# Patient Record
Sex: Female | Born: 1935
Health system: Southern US, Community
[De-identification: ages and names within clinical notes are randomized; demographics above are authoritative.]

## PROBLEM LIST (undated history)

## (undated) DIAGNOSIS — D649 Anemia, unspecified: Secondary | ICD-10-CM

## (undated) DIAGNOSIS — F329 Major depressive disorder, single episode, unspecified: Secondary | ICD-10-CM

## (undated) DIAGNOSIS — F0390 Unspecified dementia without behavioral disturbance: Secondary | ICD-10-CM

## (undated) DIAGNOSIS — K589 Irritable bowel syndrome without diarrhea: Secondary | ICD-10-CM

## (undated) DIAGNOSIS — F29 Unspecified psychosis not due to a substance or known physiological condition: Secondary | ICD-10-CM

## (undated) DIAGNOSIS — H509 Unspecified strabismus: Secondary | ICD-10-CM

## (undated) DIAGNOSIS — E785 Hyperlipidemia, unspecified: Secondary | ICD-10-CM

## (undated) DIAGNOSIS — F32A Depression, unspecified: Secondary | ICD-10-CM

## (undated) DIAGNOSIS — E0590 Thyrotoxicosis, unspecified without thyrotoxic crisis or storm: Secondary | ICD-10-CM

---

## 2000-07-14 ENCOUNTER — Other Ambulatory Visit: Admission: RE | Admit: 2000-07-14 | Discharge: 2000-07-14 | Payer: Self-pay | Admitting: Family Medicine

## 2018-06-01 ENCOUNTER — Other Ambulatory Visit: Payer: Self-pay

## 2018-06-01 ENCOUNTER — Emergency Department (HOSPITAL_COMMUNITY): Payer: Medicare Other

## 2018-06-01 ENCOUNTER — Encounter (HOSPITAL_COMMUNITY): Payer: Self-pay | Admitting: *Deleted

## 2018-06-01 ENCOUNTER — Emergency Department (HOSPITAL_COMMUNITY)
Admission: EM | Admit: 2018-06-01 | Discharge: 2018-06-03 | Disposition: A | Discharge status: Other Acute Inpatient Hospital | Payer: Medicare Other | Attending: Emergency Medicine | Admitting: Emergency Medicine

## 2018-06-01 DIAGNOSIS — F22 Delusional disorders: Secondary | ICD-10-CM | POA: Diagnosis not present

## 2018-06-01 DIAGNOSIS — D509 Iron deficiency anemia, unspecified: Secondary | ICD-10-CM | POA: Diagnosis not present

## 2018-06-01 DIAGNOSIS — F329 Major depressive disorder, single episode, unspecified: Secondary | ICD-10-CM | POA: Diagnosis not present

## 2018-06-01 DIAGNOSIS — E0590 Thyrotoxicosis, unspecified without thyrotoxic crisis or storm: Secondary | ICD-10-CM | POA: Diagnosis not present

## 2018-06-01 DIAGNOSIS — F29 Unspecified psychosis not due to a substance or known physiological condition: Secondary | ICD-10-CM | POA: Diagnosis not present

## 2018-06-01 DIAGNOSIS — F062 Psychotic disorder with delusions due to known physiological condition: Secondary | ICD-10-CM | POA: Diagnosis not present

## 2018-06-01 DIAGNOSIS — R4020 Unspecified coma: Secondary | ICD-10-CM | POA: Diagnosis not present

## 2018-06-01 DIAGNOSIS — M545 Low back pain: Secondary | ICD-10-CM | POA: Diagnosis not present

## 2018-06-01 DIAGNOSIS — S22080D Wedge compression fracture of T11-T12 vertebra, subsequent encounter for fracture with routine healing: Secondary | ICD-10-CM | POA: Diagnosis not present

## 2018-06-01 DIAGNOSIS — Z79899 Other long term (current) drug therapy: Secondary | ICD-10-CM | POA: Insufficient documentation

## 2018-06-01 DIAGNOSIS — F05 Delirium due to known physiological condition: Secondary | ICD-10-CM | POA: Diagnosis not present

## 2018-06-01 DIAGNOSIS — F99 Mental disorder, not otherwise specified: Secondary | ICD-10-CM | POA: Diagnosis not present

## 2018-06-01 DIAGNOSIS — X58XXXA Exposure to other specified factors, initial encounter: Secondary | ICD-10-CM | POA: Insufficient documentation

## 2018-06-01 DIAGNOSIS — R45851 Suicidal ideations: Secondary | ICD-10-CM | POA: Diagnosis not present

## 2018-06-01 DIAGNOSIS — Z681 Body mass index (BMI) 19 or less, adult: Secondary | ICD-10-CM | POA: Diagnosis not present

## 2018-06-01 DIAGNOSIS — M549 Dorsalgia, unspecified: Secondary | ICD-10-CM | POA: Diagnosis not present

## 2018-06-01 DIAGNOSIS — R4182 Altered mental status, unspecified: Secondary | ICD-10-CM | POA: Diagnosis not present

## 2018-06-01 DIAGNOSIS — S299XXD Unspecified injury of thorax, subsequent encounter: Secondary | ICD-10-CM | POA: Diagnosis present

## 2018-06-01 HISTORY — DX: Hyperlipidemia, unspecified: E78.5

## 2018-06-01 HISTORY — DX: Major depressive disorder, single episode, unspecified: F32.9

## 2018-06-01 HISTORY — DX: Irritable bowel syndrome, unspecified: K58.9

## 2018-06-01 HISTORY — DX: Unspecified psychosis not due to a substance or known physiological condition: F29

## 2018-06-01 HISTORY — DX: Unspecified strabismus: H50.9

## 2018-06-01 HISTORY — DX: Depression, unspecified: F32.A

## 2018-06-01 HISTORY — DX: Anemia, unspecified: D64.9

## 2018-06-01 HISTORY — DX: Thyrotoxicosis, unspecified without thyrotoxic crisis or storm: E05.90

## 2018-06-01 LAB — COMPREHENSIVE METABOLIC PANEL
ALBUMIN: 4 g/dL (ref 3.5–5.0)
ALK PHOS: 54 U/L (ref 38–126)
ALT: 20 U/L (ref 0–44)
AST: 26 U/L (ref 15–41)
Anion gap: 10 (ref 5–15)
BUN: 5 mg/dL — ABNORMAL LOW (ref 8–23)
CALCIUM: 9.4 mg/dL (ref 8.9–10.3)
CO2: 25 mmol/L (ref 22–32)
CREATININE: 0.4 mg/dL — AB (ref 0.44–1.00)
Chloride: 102 mmol/L (ref 98–111)
GFR calc Af Amer: >60 mL/min (ref >60)
GFR calc non Af Amer: >60 mL/min (ref >60)
GLUCOSE: 115 mg/dL — AB (ref 70–99)
Potassium: 3.8 mmol/L (ref 3.5–5.1)
Sodium: 137 mmol/L (ref 135–145)
Total Bilirubin: 1.2 mg/dL (ref 0.3–1.2)
Total Protein: 6.4 g/dL — ABNORMAL LOW (ref 6.5–8.1)

## 2018-06-01 LAB — URINALYSIS, ROUTINE W REFLEX MICROSCOPIC
BACTERIA UA: NONE SEEN
BILIRUBIN URINE: NEGATIVE
Glucose, UA: NEGATIVE mg/dL
KETONES UR: 20 mg/dL — AB
LEUKOCYTES UA: NEGATIVE
Nitrite: NEGATIVE
PROTEIN: NEGATIVE mg/dL
Specific Gravity, Urine: 1.01 (ref 1.005–1.030)
pH: 6 (ref 5.0–8.0)

## 2018-06-01 LAB — SALICYLATE LEVEL

## 2018-06-01 LAB — CBC WITH DIFFERENTIAL/PLATELET
BASOS PCT: 0 %
Basophils Absolute: 0 10*3/uL (ref 0.0–0.1)
Eosinophils Absolute: 0 10*3/uL (ref 0.0–0.7)
Eosinophils Relative: 0 %
HCT: 43.2 % (ref 36.0–46.0)
Hemoglobin: 14.2 g/dL (ref 12.0–15.0)
Lymphocytes Relative: 12 %
Lymphs Abs: 0.8 10*3/uL (ref 0.7–4.0)
MCH: 26.3 pg (ref 26.0–34.0)
MCHC: 32.9 g/dL (ref 30.0–36.0)
MCV: 80.1 fL (ref 78.0–100.0)
MONO ABS: 0.5 10*3/uL (ref 0.1–1.0)
Monocytes Relative: 7 %
NEUTROS ABS: 5.6 10*3/uL (ref 1.7–7.7)
NEUTROS PCT: 81 %
Platelets: 236 10*3/uL (ref 150–400)
RBC: 5.39 MIL/uL — ABNORMAL HIGH (ref 3.87–5.11)
RDW: 14.8 % (ref 11.5–15.5)
WBC: 7 10*3/uL (ref 4.0–10.5)

## 2018-06-01 LAB — RAPID URINE DRUG SCREEN, HOSP PERFORMED
Amphetamines: NOT DETECTED
BARBITURATES: NOT DETECTED
Benzodiazepines: NOT DETECTED
Cocaine: NOT DETECTED
Opiates: NOT DETECTED
TETRAHYDROCANNABINOL: NOT DETECTED

## 2018-06-01 LAB — ACETAMINOPHEN LEVEL: Acetaminophen (Tylenol), Serum: <10 ug/mL — ABNORMAL LOW (ref 10–30)

## 2018-06-01 LAB — ETHANOL: Alcohol, Ethyl (B): <10 mg/dL (ref <10)

## 2018-06-01 MED ORDER — DIPHENHYDRAMINE HCL 50 MG/ML IJ SOLN
25.0000 mg | Freq: Once | INTRAMUSCULAR | Status: AC
  Administered 2018-06-01: 25 mg via INTRAMUSCULAR
  Filled 2018-06-01: qty 1

## 2018-06-01 MED ORDER — LORAZEPAM 2 MG/ML IJ SOLN
1.0000 mg | Freq: Once | INTRAMUSCULAR | Status: AC
  Administered 2018-06-01: 1 mg via INTRAMUSCULAR
  Filled 2018-06-01: qty 1

## 2018-06-01 MED ORDER — LORAZEPAM 1 MG PO TABS: 1.0000 mg | ORAL_TABLET | ORAL | Status: DC | PRN

## 2018-06-01 NOTE — ED Notes (Signed)
Patients [facility]CP [facility] [phone] MD-Dr. Felipa EthAvva

## 2018-06-01 NOTE — ED Notes (Signed)
Bed: WA27 Expected date:  Expected time:  Means of arrival:  Comments: EMS-psychosis

## 2018-06-01 NOTE — ED Notes (Signed)
Pt & family provided copy of department rules.

## 2018-06-01 NOTE — ED Provider Notes (Signed)
[facility]-EMERGENCY DEPT Provider Note   CSN: [id number] Arrival date & time: 06/01/18  1643     History   Chief Complaint Chief Complaint  Patient presents with  . IVC  . Psychiatriac evaluation    HPI Deanna Strong is a 84 y.o. female.  Deanna Strong is a 84 y.o. Female with a history of anemia, hyperlipidemia, hypothyroidism, IBS, who presents to the emergency department after being sent by her primary care provider for evaluation of psychosis.  Patient reports she does not need to be here she is being interviewed by the fire department for special tests, and she only wants to be checked out for her low back pain.  Further history is provided by the patient's daughter who reports on Friday she was called by the patient's neighbors very early in the morning when the patient was found laying in her front yard, with concern she may have fallen, but according to the daughter the patient was seen up crawling around on her hands and knees in the yard before, she brought the patient back to her house where she continued to display similar odd behaviors and reports Saturday morning she was yelling for 6 hours straight while intermittently crawling around on the floor of her home.  She also repeatedly states things like "the beams and angles are pulling on my back and belly".  Patient reported to myself that "he airport around her house was sending beams into her brain so that she could not communicate with the others".  Patient repeatedly states that she does not need to see a psychiatrist and that she does not want to deal with this evaluation and she would rather go and be with God and does not want to live with this.  She says to a friend at the bedside "and we know how to die Wilnette Kales do not we".  Patient denies any homicidal ideations or AVH.  She denies focal chest pain or shortness of breath, no headaches or vision changes, no abdominal pain.  She does report low back pain  which daughter reports she has been intermittently complaining of over the past few weeks, but has been ambulatory and moving around without difficulty.  Patient's daughter denies history of similar behavior in the past, patient was taken to her internal medicine doctor, Dr. Felipa Eth, and sent to the ED for further evaluation for concern of psychosis, daughter also expresses concern for early signs of dementia.  No recent head trauma or injury.  They were trying to get patient placed at friend's home, but due to her erratic behaviors over the past few days were not able to get physician approval.      Past Medical History:  Diagnosis Date  . Anemia   . Depression   . Hyperlipidemia   . Hyperthyroidism   . IBS (irritable bowel syndrome)   . Psychosis (HCC) 06/01/2018  . Strabismus     There are no active problems to display for this patient.   OB History   None      Home Medications    Prior to Admission medications   Medication Sig Start Date End Date Taking? Authorizing Provider  nystatin (MYCOSTATIN) 100000 UNIT/ML suspension Take 2.5 mLs by mouth 3 (three) times daily.   Yes [provider]  OVER THE COUNTER MEDICATION Take 1-2 capsules by mouth daily.   Yes [provider]    Family History No family history on file.  Social History Social History  Tobacco Use  . Smoking status: Not on file  Substance Use Topics  . Alcohol use: Not on file  . Drug use: Not on file     Allergies   Patient has no known allergies.   Review of Systems Review of Systems  Constitutional: Negative for chills and fever.  HENT: Negative.   Eyes: Negative for visual disturbance.  Respiratory: Negative for cough, chest tightness, shortness of breath and wheezing.   Cardiovascular: Negative for chest pain, palpitations and leg swelling.  Gastrointestinal: Negative for abdominal pain, diarrhea, nausea and vomiting.  Genitourinary: Negative for dysuria and frequency.    Musculoskeletal: Positive for back pain. Negative for arthralgias, joint swelling, myalgias and neck pain.  Skin: Negative for color change, rash and wound.  Neurological: Negative for dizziness, syncope, weakness, light-headedness, numbness and headaches.  Psychiatric/Behavioral: Positive for behavioral problems, confusion, sleep disturbance and suicidal ideas. Negative for self-injury.     Physical Exam Updated Vital Signs BP (!) 147/79 (BP Location: Right Arm)   Pulse 73   Temp 98.7 F (37.1 C) (Oral)   Resp 20   SpO2 94%   Physical Exam  Constitutional: She appears well-developed and well-nourished. No distress.  Patient appears clean and in no acute distress, refusing all interventions  HENT:  Head: Normocephalic and atraumatic.  Mouth/Throat: Oropharynx is clear and moist.  No evidence of head trauma  Eyes: Pupils are equal, round, and reactive to light. EOM are normal. Right eye exhibits no discharge. Left eye exhibits no discharge.  No nystagmus  Neck: Normal range of motion. Neck supple.  Cardiovascular: Normal rate, regular rhythm, normal heart sounds and intact distal pulses.  Pulmonary/Chest: Effort normal and breath sounds normal. No respiratory distress.  Respirations equal and unlabored, patient able to speak in full sentences, lungs clear to auscultation bilaterally  Abdominal: Soft. Bowel sounds are normal. She exhibits no distension and no mass. There is no tenderness. There is no guarding.  Abdomen soft, nondistended, nontender to palpation in all quadrants without guarding or peritoneal signs  Musculoskeletal: She exhibits no edema or deformity.  Patient localizes pain to the lower thoracic, and upper midline lumbar spine, no palpable deformity on exam, ambulatory and moving lower extremities without difficulty  Neurological: She is alert. Coordination normal.  Speech is clear, able to follow commands but easily distracted and requiring redirection, oriented to  person, place and time but not situation CN III-XII intact Normal strength in upper and lower extremities bilaterally including dorsiflexion and plantar flexion, strong and equal grip strength Sensation normal to light and sharp touch Moves extremities without ataxia, coordination intact No pronator drift  Skin: Skin is warm and dry. Capillary refill takes less than 2 seconds. She is not diaphoretic.  Psychiatric: Her affect is labile. Her speech is rapid and/or pressured and tangential. She is hyperactive. She is not actively hallucinating. Thought content is delusional. Cognition and memory are impaired. She expresses impulsivity. She expresses suicidal ideation. She expresses no homicidal ideation. She expresses no suicidal plans.  Nursing note and vitals reviewed.    ED Treatments / Results  Labs (all labs ordered are listed, but only abnormal results are displayed) Labs Reviewed  COMPREHENSIVE METABOLIC PANEL - Abnormal; Notable for the following components:      Result Value   Glucose, Bld 115 (*)    BUN 5 (*)    Creatinine, Ser 0.40 (*)    Total Protein 6.4 (*)    All other components within normal limits  CBC WITH  DIFFERENTIAL/PLATELET - Abnormal; Notable for the following components:   RBC 5.39 (*)    All other components within normal limits  ACETAMINOPHEN LEVEL - Abnormal; Notable for the following components:   Acetaminophen (Tylenol), Serum <10 (*)    All other components within normal limits  URINALYSIS, ROUTINE W REFLEX MICROSCOPIC - Abnormal; Notable for the following components:   Hgb urine dipstick MODERATE (*)    Ketones, ur 20 (*)    All other components within normal limits  ETHANOL  RAPID URINE DRUG SCREEN, HOSP PERFORMED  SALICYLATE LEVEL    EKG EKG Interpretation  Date/Time:  Tuesday June 01 2018 20:59:05 EDT Ventricular Rate:  75 PR Interval:  142 QRS Duration: 94 QT Interval:  412 QTC Calculation: 460 R Axis:   57 Text Interpretation:  Normal  sinus rhythm Nonspecific ST abnormality Abnormal ECG No previous ECGs available Confirmed by Richardean CanalYao, David H [phone]([id number]) on 06/01/2018 9:27:52 PM   Radiology Dg Chest 2 View  Result Date: 06/01/2018 CLINICAL DATA:  Chronic back pain EXAM: CHEST - 2 VIEW COMPARISON:  None. FINDINGS: Lungs are clear.  No pleural effusion or pneumothorax. The heart is normal in size. Visualized osseous structures are within normal limits. IMPRESSION: Normal chest radiographs. Electronically Signed   By: Charline BillsSriyesh  Krishnan M.D.   On: 06/01/2018 21:56   Dg Lumbar Spine Complete  Result Date: 06/01/2018 CLINICAL DATA:  Chronic back pain EXAM: LUMBAR SPINE - COMPLETE 4+ VIEW COMPARISON:  None. FINDINGS: Five lumbar-type vertebral bodies. Normal lumbar lordosis. No evidence of fracture or dislocation. Lumbar vertebral body heights are maintained. Mild multilevel degenerative changes. Mild superior endplate compression is suspected at T12, although this appearance is age indeterminate. No retropulsion. Visualized bony pelvis appears intact. IMPRESSION: Mild superior endplate compression suspected at T12, age indeterminate. No retropulsion. Degenerative changes of the lumbar spine. Electronically Signed   By: Charline BillsSriyesh  Krishnan M.D.   On: 06/01/2018 21:56   Ct Head Wo Contrast  Result Date: 06/01/2018 CLINICAL DATA:  84 y/o F; altered level of consciousness, unexplained. EXAM: CT HEAD WITHOUT CONTRAST TECHNIQUE: Contiguous axial images were obtained from the base of the skull through the vertex without intravenous contrast. COMPARISON:  None. FINDINGS: Brain: No evidence of acute infarction, hemorrhage, hydrocephalus, extra-axial collection or mass lesion/mass effect. Mild chronic microvascular ischemic changes and volume loss of the brain. Vascular: No hyperdense vessel or unexpected calcification. Skull: Normal. Negative for fracture or focal lesion. Sinuses/Orbits: No acute finding. Other: None. IMPRESSION: 1. No acute intracranial  abnormality identified. 2. Mild chronic microvascular ischemic changes and parenchymal volume loss of the brain. Electronically Signed   By: Mitzi HansenLance  Furusawa-Stratton M.D.   On: 06/01/2018 21:37    Procedures Procedures (including critical care time)  Medications Ordered in ED Medications  LORazepam (ATIVAN) tablet 1 mg (has no administration in time range)  LORazepam (ATIVAN) injection 1 mg (1 mg Intramuscular Given 06/01/18 2107)  diphenhydrAMINE (BENADRYL) injection 25 mg (25 mg Intramuscular Given 06/01/18 2107)     Initial Impression / Assessment and Plan / ED Course  I have reviewed the triage vital signs and the nursing notes.  Pertinent labs & imaging results that were available during my care of the patient were reviewed by me and considered in my medical decision making (see chart for details).  Patient presents for evaluation of possible new onset psychosis PCP office.  Daughter describes erratic behavior over the past 4 days with delusional thinking, while in the room patient has also endorsed suicidal ideations multiple  times, denies HI, does not appear to be responding to internal stimuli but does express multiple delusions that people are using beams and angles to alter her body and brain.  Patient initially refusing any imaging or lab work, daughter attempted to take out IVC paperwork but this was rescinded by the magistrate, given patient's endorsement of suicidal ideations and delusional thinking to feel that she will need to be IVC for further evaluation.  Myself and Dr. Silverio Lay placed patient under IVC here in the ED.  Aside from low back pain localized to the lower thoracic and upper lumbar spine, no focal medical complaints.  Patient mildly hypertensive but vitals otherwise normal, lungs clear, heart RRR, abdomen benign, normal neurologic exam.  Will get typical medical screening labs as well as urinalysis, chest x-ray, EKG, lumbar x-ray, and CT of the head given new onset of  symptoms.  Medical work-up overall very reassuring, no leukocytosis, normal hemoglobin, no acute metabolic derangements, normal renal and liver function, urinalysis without signs of infection, UDS negative, ethanol, salicylate and acetaminophen levels negative.  EKG without concerning changes, chest x-ray without active cardiopulmonary disease CT head is clear.  Lumbar spine shows likely old T12 compression fracture without any evidence of retropulsion or other acute findings.  Patient is neurologically intact without localized tenderness or deformity and is moving all extremities without difficulty so do not feel any intervention is required for this.  At this time patient is medically cleared for TTS evaluation, patient placed under ED psych hold, PRN Ativan as needed for agitation, as this seemed to help patient calm down significantly.  Pending TTS evaluation for appropriate disposition.  Final Clinical Impressions(s) / ED Diagnoses   Final diagnoses:  Suicidal ideation  Delusional thoughts (HCC)  Compression fracture of T12 vertebra with routine healing    ED Discharge Orders    None       Dartha Lodge, [place] 06/02/18 0133    Charlynne Pander, MD 06/05/18 1550

## 2018-06-01 NOTE — ED Notes (Signed)
Deanna EconomyCarol Strong-pts daughter [phone] Emergency contact

## 2018-06-01 NOTE — ED Triage Notes (Signed)
Patient came from home by EMS, went to MD today and was dx with psychosis and [facility]  Patient doesn't want to go so EMS brought patient here to be medically cleared while [facility]  Patient agrees to coming here due to having chronic back pain.

## 2018-06-01 NOTE — ED Notes (Signed)
Pt refusing blood draw, sts "it costs too much".  Informed pt that the EDP has put in orders to have blood draw.  Pt sts she understands but still refuses.  RN notified. Pt is not IVC

## 2018-06-01 NOTE — ED Notes (Signed)
Family at bedside. 

## 2018-06-01 NOTE — ED Notes (Signed)
Bed: [id number] Expected date:  Expected time:  Means of arrival:  Comments: [facility]

## 2018-06-01 NOTE — ED Notes (Signed)
Patient is refusing blood work.

## 2018-06-02 MED ORDER — QUETIAPINE FUMARATE 50 MG PO TABS: 50.0000 mg | ORAL_TABLET | Freq: Every day | ORAL | Status: DC

## 2018-06-02 NOTE — ED Notes (Signed)
Family (daughter) now at bedside with patient.

## 2018-06-02 NOTE — ED Notes (Signed)
Pt stated "I just want to die a natural death."

## 2018-06-02 NOTE — ED Notes (Signed)
Patient refused seroquel

## 2018-06-02 NOTE — Progress Notes (Signed)
[facility] [place] [facility] [place] at this [facility].  CSW will continue to follow for D/C needs.  Dorothe PeaJonathan F. Machele Deihl, LCSW, LCAS, CSI Clinical Social Worker Ph: [phone]

## 2018-06-02 NOTE — BH Assessment (Addendum)
[facility]  Per Juanetta BeetsJacqueline Norman, DO, this pt requires psychiatric hospitalization at this time.  Pt presents under IVC initiated by EDP Chaney Mallingavid Yao, MD.  The following facilities have been contacted to seek placement for this pt, with results as noted:  Beds available, information sent, decision pending:  [facility]   At capacityOtho Perl:  [facility]  Please note that in the presence of this Clinical research associatewriter and pt's daughter, Lamar LaundrySonya [phone]), pt gave verbal consent to release information to the daughter, asking that she be kept updated regarding disposition.  Doylene Canninghomas Davina Howlett, [place] Behavioral Health Coordinator [phone]

## 2018-06-02 NOTE — Progress Notes (Addendum)
CSW received a call from Deanna Strong  [facility] [place] Number for report is: [phone] Pt's unit/room/bed number will be: [facility] 5th floor Accepting physician: Dr. Shanon PayorAndrew Owens (DO)   Pt can arrive ASAP on 7/31 or anytime on 8/1  Pt is IVC'd  CSW will update RN.  Dorothe PeaJonathan F. Ranika Mcniel, LCSW, LCAS, CSI Clinical Social Worker Ph: [phone]   '

## 2018-06-02 NOTE — BH Assessment (Signed)
Assessment Note  Deanna OrleansJutta Strong is an 84 y.o. female who [facility] for [facility] evaluation and later IVC'd by ER provider's for making suicidal statements.  According to the IVC Petition Pt states "I just want to go to God and that I know how to die." Pt states "That people are using beams and angles to pull her back apart and get into her head so she can't communicate with others."   LPC attempted to assess pt but pt was resting and not alert therefore information was gathered from pt's daughter, Ladon ApplebaumCarol Folmer [phone]) to complete this assessment.  Per daughter, Patient lived on her own until 05/28/18 when she was found crawling around in the front yard in the early morning.  Patient was moved into the home with the daughter and had been doing fine until this morning when the patient was found in the living room doing the same thing.  The patient's mother had a decline in her mental state a 5 years before her death.  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 Pt was military spouse and considers herself a widow because she never remarried after divorcing.  Pt did not have any medication changes.  Daughter did not report any history of abuse of psychiatric or substance abuse treatment.  Disposition: [facility], Nira ConnJason Berry, NP who recommends the pt is observed overnight for safety and stability to be reevaluated in the AM by psychiatry.  [facility] provider and pt's nurse of the recommended disposition.  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 Anemia   . Depression   . Hyperlipidemia   . Hyperthyroidism   . IBS (irritable bowel syndrome)   . Psychosis (HCC) 06/01/2018  . Strabismus       Family  History: No family history on file.  Social History:  has no tobacco, alcohol, and drug history on file.  Additional Social History:  Alcohol / Drug Use Pain Medications: See MARs Prescriptions: See MARs Over the Counter: See MARs History of alcohol / drug use?: No history of alcohol / drug abuse  CIWA: CIWA-Ar BP: (!) 147/79 Pulse Rate: 73 COWS:    Allergies: No Known Allergies  Home Medications:  (Not in a hospital admission)  OB/GYN Status:  No LMP recorded.  General Assessment Data [facility] of Assessment: [facility] TTS Assessment: In system Is this a Tele or Face-to-Face Assessment?: Face-to-Face Is this an Initial Assessment or a Re-assessment for this encounter?: Initial Assessment Marital status: Widowed(claims widowed never remarried after divorce) Juanell FairlyMaiden name: Hilcken Is patient pregnant?: No Pregnancy Status: No Living Arrangements: Children(Pt moved in with daughter on 05/28/18) Can pt return to current living arrangement?: Yes(If long term place is not more suitable) Admission Status: Involuntary Is patient capable of signing voluntary admission?: No Referral Source: Self/Family/Friend Insurance type: Medicare     Crisis Care Plan Living Arrangements: Children(Pt moved in with daughter on 05/28/18) Legal Guardian: Other:(Self)  Education Status Is patient currently in school?: No Is the patient employed, unemployed or receiving disability?: (Retired)  Risk to self with the past 6 months Suicidal Ideation: Yes-Currently Present Has patient been a risk to self within the past 6 months prior to admission? : No Suicidal  Intent: No Has patient had any suicidal intent within the past 6 months prior to admission? : No Is patient at risk for suicide?: No Suicidal Plan?: No Has patient had any suicidal plan within the past 6 months prior to admission? : No Access to Means: No Previous Attempts/Gestures: No Triggers for Past Attempts: Unknown Intentional Self  Injurious Behavior: None Family Suicide History: No Recent stressful life event(s): Other (Comment)(Decline in mental health) Persecutory voices/beliefs?: No Depression: Yes Depression Symptoms: Insomnia, Isolating, Guilt, Feeling worthless/self pity, Feeling angry/irritable Substance abuse history and/or treatment for substance abuse?: No Suicide prevention information given to non-admitted patients: Not applicable  Risk to Others within the past 6 months Homicidal Ideation: No Does patient have any lifetime risk of violence toward others beyond the six months prior to admission? : No Thoughts of Harm to Others: No Current Homicidal Intent: No Current Homicidal Plan: No Access to Homicidal Means: No History of harm to others?: No Assessment of Violence: None Noted Does patient have access to weapons?: No Criminal Charges Pending?: No Does patient have a court date: No Is patient on probation?: No  Psychosis Hallucinations: Visual Delusions: Unspecified  Mental Status Report Appearance/Hygiene: In hospital gown Eye Contact: Unable to Assess Motor Activity: Freedom of movement Speech: Incoherent, Word salad Level of Consciousness: Sleeping Mood: Other (Comment) Affect: Unable to Assess Anxiety Level: None Thought Processes: Unable to Assess Judgement: Unable to Assess Orientation: Unable to assess Obsessive Compulsive Thoughts/Behaviors: Unable to Assess  Cognitive Functioning Concentration: Unable to Assess Memory: Recent Impaired, Remote Impaired Is patient IDD: No Is patient DD?: No Insight: Poor Impulse Control: Poor Appetite: Fair Have you had any weight changes? : No Change Sleep: Decreased Total Hours of Sleep: 3 Vegetative Symptoms: None  ADLScreening [facility] Assessment Services) Patient's cognitive ability adequate to safely complete daily activities?: Yes Patient able to express need for assistance with ADLs?: Yes Independently performs ADLs?: Yes  (appropriate for developmental age)  Prior Inpatient Therapy Prior Inpatient Therapy: No  Prior Outpatient Therapy Prior Outpatient Therapy: No Does patient have an ACCT team?: No Does patient have Intensive In-House Services?  : No Does patient have [facility] services? : No Does patient have P4CC services?: No  ADL Screening (condition at time of admission) Patient's cognitive ability adequate to safely complete daily activities?: Yes Is the patient deaf or have difficulty hearing?: No Does the patient have difficulty seeing, even when wearing glasses/contacts?: No Does the patient have difficulty concentrating, remembering, or making decisions?: No Patient able to express need for assistance with ADLs?: Yes Does the patient have difficulty dressing or bathing?: No Independently performs ADLs?: Yes (appropriate for developmental age) Does the patient have difficulty walking or climbing stairs?: No Weakness of Legs: None Weakness of Arms/Hands: None  Home Assistive Devices/Equipment Home Assistive Devices/Equipment: None    Abuse/Neglect Assessment (Assessment to be complete while patient is alone) Abuse/Neglect Assessment Can Be Completed: Unable to assess, patient is non-responsive or altered mental status   Consults Spiritual Care Consult Needed: No Social Work Consult Needed: Yes (Comment)(Pt may need alternative living placement) Advance Directives (For Healthcare) Does Patient Have a Medical Advance Directive?: No Would patient like information on creating a medical advance directive?: No - Patient declined          Disposition: Discussed case with [facility] provider, Nira Conn, NP who recommends the pt is observed overnight for safety and stability to be reevaluated in the AM by psychiatry.  [facility] informed ER provider and pt's nurse of the  recommended disposition.  Disposition Initial Assessment Completed for this Encounter: Yes Patient referred to: Other  (Comment)(Observe for safety and stability and reevaluate in the AM )  On Site Evaluation by:   Reviewed with Physician:    Almon Whitford L Kenniya Westrich 06/02/2018 12:34 AM

## 2018-06-03 DIAGNOSIS — R419 Unspecified symptoms and signs involving cognitive functions and awareness: Secondary | ICD-10-CM | POA: Diagnosis present

## 2018-06-03 DIAGNOSIS — F22 Delusional disorders: Secondary | ICD-10-CM | POA: Diagnosis not present

## 2018-06-03 DIAGNOSIS — G301 Alzheimer's disease with late onset: Secondary | ICD-10-CM | POA: Diagnosis not present

## 2018-06-03 NOTE — ED Notes (Signed)
Pt has one personal belongings bag that will be given to Deputy.

## 2018-06-03 NOTE — ED Notes (Signed)
Tried to call report to [facility] [place]. They stated that they cannot take report until transportation has arrived here to transport patient there. Called Sheriff's department and left message for the need to transport [facility].

## 2018-06-03 NOTE — ED Notes (Signed)
Deputy Montez MoritaCarter present to transport [facility] to [facility] [place]. Report called to  Okey Regalarol at [facility] 5th Floor. She left department via W/C.

## 2018-06-03 NOTE — ED Notes (Signed)
Sheriffs Dep, called and will be here in about half an hour. Pt has been made aware and is in process of getting ready and obtaining updated VS.

## 2018-07-05 DIAGNOSIS — F4310 Post-traumatic stress disorder, unspecified: Secondary | ICD-10-CM | POA: Diagnosis not present

## 2018-07-05 DIAGNOSIS — F419 Anxiety disorder, unspecified: Secondary | ICD-10-CM | POA: Diagnosis not present

## 2018-07-05 DIAGNOSIS — F29 Unspecified psychosis not due to a substance or known physiological condition: Secondary | ICD-10-CM | POA: Diagnosis not present

## 2018-07-05 DIAGNOSIS — F5102 Adjustment insomnia: Secondary | ICD-10-CM | POA: Diagnosis not present

## 2018-07-05 DIAGNOSIS — R451 Restlessness and agitation: Secondary | ICD-10-CM | POA: Diagnosis not present

## 2018-07-05 DIAGNOSIS — F0391 Unspecified dementia with behavioral disturbance: Secondary | ICD-10-CM | POA: Diagnosis not present

## 2018-07-08 DIAGNOSIS — F0391 Unspecified dementia with behavioral disturbance: Secondary | ICD-10-CM | POA: Diagnosis not present

## 2018-07-08 DIAGNOSIS — F29 Unspecified psychosis not due to a substance or known physiological condition: Secondary | ICD-10-CM | POA: Diagnosis not present

## 2018-07-08 DIAGNOSIS — M4854XS Collapsed vertebra, not elsewhere classified, thoracic region, sequela of fracture: Secondary | ICD-10-CM | POA: Diagnosis not present

## 2018-07-08 DIAGNOSIS — F22 Delusional disorders: Secondary | ICD-10-CM | POA: Diagnosis not present

## 2018-07-27 ENCOUNTER — Telehealth: Payer: Self-pay

## 2018-07-27 NOTE — Telephone Encounter (Signed)
VM left to offer to schedule visit with Palliative Care 

## 2018-07-29 ENCOUNTER — Telehealth: Payer: Self-pay

## 2018-07-29 NOTE — Telephone Encounter (Signed)
Received a phone call from patient's a daughter, Celine Mans, to schedule a visit with Palliative Care. Scheduled for 07/30/18.

## 2018-07-30 ENCOUNTER — Non-Acute Institutional Stay: Payer: Medicare Other | Admitting: Nurse Practitioner

## 2018-07-30 DIAGNOSIS — Z9114 Patient's other noncompliance with medication regimen: Secondary | ICD-10-CM | POA: Diagnosis not present

## 2018-07-30 DIAGNOSIS — F29 Unspecified psychosis not due to a substance or known physiological condition: Secondary | ICD-10-CM

## 2018-07-30 DIAGNOSIS — F0391 Unspecified dementia with behavioral disturbance: Secondary | ICD-10-CM

## 2018-07-30 DIAGNOSIS — R4689 Other symptoms and signs involving appearance and behavior: Secondary | ICD-10-CM | POA: Diagnosis not present

## 2018-07-30 DIAGNOSIS — R9431 Abnormal electrocardiogram [ECG] [EKG]: Secondary | ICD-10-CM | POA: Diagnosis not present

## 2018-07-30 DIAGNOSIS — Z515 Encounter for palliative care: Secondary | ICD-10-CM | POA: Diagnosis not present

## 2018-07-30 DIAGNOSIS — R918 Other nonspecific abnormal finding of lung field: Secondary | ICD-10-CM | POA: Diagnosis not present

## 2018-07-30 DIAGNOSIS — I771 Stricture of artery: Secondary | ICD-10-CM | POA: Diagnosis not present

## 2018-07-30 NOTE — Progress Notes (Signed)
    PALLIATIVE CARE CONSULT VISIT   PATIENT NAME: Deanna Strong DOB: 1934-03-30 MRN: [id number]   [facility]  PRIMARY CARE PROVIDER:   [name]  REFERRING PROVIDER:  [name] [address] [place], [place] [address]  RESPONSIBLE PARTY:   Charlise Giovanetti [phone]  ASSESSMENT:    84 yo female with dementia found at her home with bizarre behaviors/crawling on the floor and making SI statements ; patient found to be acutely psychotic and was admitted to geri-psych unit. Daughter's report 3 day admission. Admitted to [facility] assisted living. Patient has been refusing all medications. During PC admission meeting with this writer, patient and both daughters, patient was angry, hostile, verbally aggressive and physically aggressive (no hitting). Patient continue to say that she is not going to take medications "poison" because she can eat natural foods.   Meeting ended early due to patient becoming increasingly angry and agitated.Staff aware of patient's current mood.      Assessment and PLAN:  Dementia with behavioral problems -psychosis -AV hallucinations -delusions -passive suicidal ideations -patient oriented to person, and place; patient talks about seeing people come in her room; with active delusion; says "she wants to die" when asked if she would do anything to hurt herself she loudly says "why would I do that" -thinks nothing is wrong with her and is refusing ALL medications -was very angry and aggressive during visit with this Clinical research associate and her two daughter's  -chronic medical problems -pancreatic insufficiency -Hyperthyroidism/thryomegaly -IBS -Anemia -Tinea pedis -HLD -strabimus -patient refusing all medications  ACP -will set meeting up with daughter's in separate location if patient continues to be angry and aggressive.    I spent 60 minutes providing this consultation,  from 10:00 to 11:00. More  than 50% of the time in this consultation was spent coordinating communication.   HISTORY OF PRESENT ILLNESS:  Deanna Strong is a 84 y.o. year old female with multiple medical problems including psychosis with hallucinations, delusions, paranoia, pancreatic insuffiencey, hyperthyroidism, thyromegaly, hyperlipidemia, anemia (refused wu). Palliative Care was asked to help with symptom management address goals of care.   CODE STATUS: FULL  PPS: 80% FAST unable to assess due to mood/aggression HOSPICE ELIGIBILITY/DIAGNOSIS: TBD  PAST MEDICAL HISTORY:  Past Medical History:  Diagnosis Date  . Anemia   . Depression   . Hyperlipidemia   . Hyperthyroidism   . IBS (irritable bowel syndrome)   . Psychosis (HCC) 06/01/2018  . Strabismus     SOCIAL HX:  Social History   Tobacco Use  . Smoking status: Not on file  Substance Use Topics  . Alcohol use: Not on file    ALLERGIES: No Known Allergies   PERTINENT MEDICATIONS:  Outpatient Encounter Medications as of 07/30/2018  Medication Sig  . nystatin (MYCOSTATIN) 100000 UNIT/ML suspension Take 2.5 mLs by mouth 3 (three) times daily.  Marland Kitchen OVER THE COUNTER MEDICATION Take 1-2 capsules by mouth daily.   No facility-administered encounter medications on file as of 07/30/2018.     PHYSICAL EXAM: limited ,  patient uncooperative, angry and aggressive  General: NAD, frail appearing, thin Cardiovascular: not assessed Pulmonary: not assessed Abdomen: not assessed GU: not assessed Extremities: no edema, no joint deformities Skin: no rashes on exposed skin Neurological:  Non-focal, angry, hostile, with active psychosis, AV Hallucinations and delusions  Vondell Sowell G [place], NP

## 2018-07-31 ENCOUNTER — Encounter: Payer: Self-pay | Admitting: Nurse Practitioner

## 2018-07-31 DIAGNOSIS — F0391 Unspecified dementia with behavioral disturbance: Secondary | ICD-10-CM | POA: Diagnosis not present

## 2018-07-31 DIAGNOSIS — B351 Tinea unguium: Secondary | ICD-10-CM | POA: Diagnosis present

## 2018-07-31 DIAGNOSIS — G309 Alzheimer's disease, unspecified: Secondary | ICD-10-CM | POA: Diagnosis present

## 2018-07-31 DIAGNOSIS — J439 Emphysema, unspecified: Secondary | ICD-10-CM | POA: Diagnosis not present

## 2018-07-31 DIAGNOSIS — R911 Solitary pulmonary nodule: Secondary | ICD-10-CM | POA: Diagnosis not present

## 2018-07-31 DIAGNOSIS — F0281 Dementia in other diseases classified elsewhere with behavioral disturbance: Secondary | ICD-10-CM | POA: Diagnosis present

## 2018-07-31 DIAGNOSIS — Z0389 Encounter for observation for other suspected diseases and conditions ruled out: Secondary | ICD-10-CM | POA: Diagnosis not present

## 2018-07-31 DIAGNOSIS — J984 Other disorders of lung: Secondary | ICD-10-CM | POA: Diagnosis not present

## 2018-07-31 DIAGNOSIS — E785 Hyperlipidemia, unspecified: Secondary | ICD-10-CM | POA: Diagnosis not present

## 2018-07-31 DIAGNOSIS — F22 Delusional disorders: Secondary | ICD-10-CM | POA: Diagnosis not present

## 2018-07-31 DIAGNOSIS — Z79899 Other long term (current) drug therapy: Secondary | ICD-10-CM | POA: Diagnosis not present

## 2018-07-31 DIAGNOSIS — Z111 Encounter for screening for respiratory tuberculosis: Secondary | ICD-10-CM | POA: Diagnosis not present

## 2018-07-31 DIAGNOSIS — Z23 Encounter for immunization: Secondary | ICD-10-CM | POA: Diagnosis not present

## 2018-07-31 DIAGNOSIS — B353 Tinea pedis: Secondary | ICD-10-CM | POA: Diagnosis present

## 2018-07-31 DIAGNOSIS — Z9114 Patient's other noncompliance with medication regimen: Secondary | ICD-10-CM | POA: Diagnosis not present

## 2018-09-03 ENCOUNTER — Ambulatory Visit: Payer: Self-pay | Admitting: Psychiatry

## 2018-09-17 DIAGNOSIS — R419 Unspecified symptoms and signs involving cognitive functions and awareness: Secondary | ICD-10-CM | POA: Diagnosis not present

## 2018-09-23 DIAGNOSIS — R419 Unspecified symptoms and signs involving cognitive functions and awareness: Secondary | ICD-10-CM | POA: Diagnosis not present

## 2018-10-06 DIAGNOSIS — R419 Unspecified symptoms and signs involving cognitive functions and awareness: Secondary | ICD-10-CM | POA: Diagnosis not present

## 2018-10-07 DIAGNOSIS — R419 Unspecified symptoms and signs involving cognitive functions and awareness: Secondary | ICD-10-CM | POA: Diagnosis not present

## 2018-10-12 ENCOUNTER — Encounter: Payer: Medicare Other | Admitting: Nurse Practitioner

## 2018-10-21 DIAGNOSIS — R419 Unspecified symptoms and signs involving cognitive functions and awareness: Secondary | ICD-10-CM | POA: Diagnosis not present

## 2018-11-11 DIAGNOSIS — R419 Unspecified symptoms and signs involving cognitive functions and awareness: Secondary | ICD-10-CM | POA: Diagnosis not present

## 2018-11-23 DIAGNOSIS — F0391 Unspecified dementia with behavioral disturbance: Secondary | ICD-10-CM | POA: Diagnosis not present

## 2018-11-23 DIAGNOSIS — E0590 Thyrotoxicosis, unspecified without thyrotoxic crisis or storm: Secondary | ICD-10-CM | POA: Diagnosis not present

## 2018-11-23 DIAGNOSIS — Z681 Body mass index (BMI) 19 or less, adult: Secondary | ICD-10-CM | POA: Diagnosis not present

## 2018-11-23 DIAGNOSIS — D509 Iron deficiency anemia, unspecified: Secondary | ICD-10-CM | POA: Diagnosis not present

## 2018-11-23 DIAGNOSIS — F29 Unspecified psychosis not due to a substance or known physiological condition: Secondary | ICD-10-CM | POA: Diagnosis not present

## 2018-11-23 DIAGNOSIS — K589 Irritable bowel syndrome without diarrhea: Secondary | ICD-10-CM | POA: Diagnosis not present

## 2018-11-25 DIAGNOSIS — R419 Unspecified symptoms and signs involving cognitive functions and awareness: Secondary | ICD-10-CM | POA: Diagnosis not present

## 2018-11-29 DIAGNOSIS — R419 Unspecified symptoms and signs involving cognitive functions and awareness: Secondary | ICD-10-CM | POA: Diagnosis not present

## 2018-12-09 DIAGNOSIS — R419 Unspecified symptoms and signs involving cognitive functions and awareness: Secondary | ICD-10-CM | POA: Diagnosis not present

## 2018-12-23 DIAGNOSIS — R419 Unspecified symptoms and signs involving cognitive functions and awareness: Secondary | ICD-10-CM | POA: Diagnosis not present

## 2018-12-27 DIAGNOSIS — R419 Unspecified symptoms and signs involving cognitive functions and awareness: Secondary | ICD-10-CM | POA: Diagnosis not present

## 2018-12-28 DIAGNOSIS — L0889 Other specified local infections of the skin and subcutaneous tissue: Secondary | ICD-10-CM | POA: Diagnosis not present

## 2018-12-28 DIAGNOSIS — L508 Other urticaria: Secondary | ICD-10-CM | POA: Diagnosis not present

## 2018-12-28 DIAGNOSIS — F22 Delusional disorders: Secondary | ICD-10-CM | POA: Diagnosis not present

## 2018-12-28 DIAGNOSIS — Z6820 Body mass index (BMI) 20.0-20.9, adult: Secondary | ICD-10-CM | POA: Diagnosis not present

## 2018-12-29 DIAGNOSIS — M4854XD Collapsed vertebra, not elsewhere classified, thoracic region, subsequent encounter for fracture with routine healing: Secondary | ICD-10-CM | POA: Diagnosis not present

## 2018-12-29 DIAGNOSIS — F0391 Unspecified dementia with behavioral disturbance: Secondary | ICD-10-CM | POA: Diagnosis not present

## 2018-12-29 DIAGNOSIS — D509 Iron deficiency anemia, unspecified: Secondary | ICD-10-CM | POA: Diagnosis not present

## 2018-12-29 DIAGNOSIS — F22 Delusional disorders: Secondary | ICD-10-CM | POA: Diagnosis not present

## 2018-12-29 DIAGNOSIS — L0889 Other specified local infections of the skin and subcutaneous tissue: Secondary | ICD-10-CM | POA: Diagnosis not present

## 2018-12-29 DIAGNOSIS — L508 Other urticaria: Secondary | ICD-10-CM | POA: Diagnosis not present

## 2018-12-29 DIAGNOSIS — D508 Other iron deficiency anemias: Secondary | ICD-10-CM | POA: Diagnosis not present

## 2019-01-03 DIAGNOSIS — L0889 Other specified local infections of the skin and subcutaneous tissue: Secondary | ICD-10-CM | POA: Diagnosis not present

## 2019-01-03 DIAGNOSIS — L508 Other urticaria: Secondary | ICD-10-CM | POA: Diagnosis not present

## 2019-01-03 DIAGNOSIS — M4854XD Collapsed vertebra, not elsewhere classified, thoracic region, subsequent encounter for fracture with routine healing: Secondary | ICD-10-CM | POA: Diagnosis not present

## 2019-01-03 DIAGNOSIS — F0391 Unspecified dementia with behavioral disturbance: Secondary | ICD-10-CM | POA: Diagnosis not present

## 2019-01-03 DIAGNOSIS — D509 Iron deficiency anemia, unspecified: Secondary | ICD-10-CM | POA: Diagnosis not present

## 2019-01-03 DIAGNOSIS — F22 Delusional disorders: Secondary | ICD-10-CM | POA: Diagnosis not present

## 2019-01-07 DIAGNOSIS — M4854XD Collapsed vertebra, not elsewhere classified, thoracic region, subsequent encounter for fracture with routine healing: Secondary | ICD-10-CM | POA: Diagnosis not present

## 2019-01-07 DIAGNOSIS — F0391 Unspecified dementia with behavioral disturbance: Secondary | ICD-10-CM | POA: Diagnosis not present

## 2019-01-07 DIAGNOSIS — D509 Iron deficiency anemia, unspecified: Secondary | ICD-10-CM | POA: Diagnosis not present

## 2019-01-07 DIAGNOSIS — F22 Delusional disorders: Secondary | ICD-10-CM | POA: Diagnosis not present

## 2019-01-07 DIAGNOSIS — L0889 Other specified local infections of the skin and subcutaneous tissue: Secondary | ICD-10-CM | POA: Diagnosis not present

## 2019-01-07 DIAGNOSIS — L508 Other urticaria: Secondary | ICD-10-CM | POA: Diagnosis not present

## 2019-01-12 DIAGNOSIS — F0391 Unspecified dementia with behavioral disturbance: Secondary | ICD-10-CM | POA: Diagnosis not present

## 2019-01-12 DIAGNOSIS — M4854XD Collapsed vertebra, not elsewhere classified, thoracic region, subsequent encounter for fracture with routine healing: Secondary | ICD-10-CM | POA: Diagnosis not present

## 2019-01-12 DIAGNOSIS — D509 Iron deficiency anemia, unspecified: Secondary | ICD-10-CM | POA: Diagnosis not present

## 2019-01-12 DIAGNOSIS — L508 Other urticaria: Secondary | ICD-10-CM | POA: Diagnosis not present

## 2019-01-12 DIAGNOSIS — L0889 Other specified local infections of the skin and subcutaneous tissue: Secondary | ICD-10-CM | POA: Diagnosis not present

## 2019-01-12 DIAGNOSIS — F22 Delusional disorders: Secondary | ICD-10-CM | POA: Diagnosis not present

## 2019-01-13 DIAGNOSIS — R419 Unspecified symptoms and signs involving cognitive functions and awareness: Secondary | ICD-10-CM | POA: Diagnosis not present

## 2019-01-19 DIAGNOSIS — F22 Delusional disorders: Secondary | ICD-10-CM | POA: Diagnosis not present

## 2019-01-19 DIAGNOSIS — M4854XD Collapsed vertebra, not elsewhere classified, thoracic region, subsequent encounter for fracture with routine healing: Secondary | ICD-10-CM | POA: Diagnosis not present

## 2019-01-19 DIAGNOSIS — F0391 Unspecified dementia with behavioral disturbance: Secondary | ICD-10-CM | POA: Diagnosis not present

## 2019-01-19 DIAGNOSIS — L508 Other urticaria: Secondary | ICD-10-CM | POA: Diagnosis not present

## 2019-01-19 DIAGNOSIS — D509 Iron deficiency anemia, unspecified: Secondary | ICD-10-CM | POA: Diagnosis not present

## 2019-01-19 DIAGNOSIS — L0889 Other specified local infections of the skin and subcutaneous tissue: Secondary | ICD-10-CM | POA: Diagnosis not present

## 2019-01-21 DIAGNOSIS — L0889 Other specified local infections of the skin and subcutaneous tissue: Secondary | ICD-10-CM | POA: Diagnosis not present

## 2019-01-21 DIAGNOSIS — M4854XD Collapsed vertebra, not elsewhere classified, thoracic region, subsequent encounter for fracture with routine healing: Secondary | ICD-10-CM | POA: Diagnosis not present

## 2019-01-21 DIAGNOSIS — L508 Other urticaria: Secondary | ICD-10-CM | POA: Diagnosis not present

## 2019-01-21 DIAGNOSIS — F0391 Unspecified dementia with behavioral disturbance: Secondary | ICD-10-CM | POA: Diagnosis not present

## 2019-01-21 DIAGNOSIS — D509 Iron deficiency anemia, unspecified: Secondary | ICD-10-CM | POA: Diagnosis not present

## 2019-01-21 DIAGNOSIS — F22 Delusional disorders: Secondary | ICD-10-CM | POA: Diagnosis not present

## 2019-02-03 DIAGNOSIS — F209 Schizophrenia, unspecified: Secondary | ICD-10-CM | POA: Diagnosis not present

## 2019-03-03 DIAGNOSIS — F200 Paranoid schizophrenia: Secondary | ICD-10-CM | POA: Diagnosis not present

## 2019-03-31 DIAGNOSIS — F200 Paranoid schizophrenia: Secondary | ICD-10-CM | POA: Diagnosis not present

## 2019-04-04 DIAGNOSIS — F0391 Unspecified dementia with behavioral disturbance: Secondary | ICD-10-CM | POA: Diagnosis not present

## 2019-04-04 DIAGNOSIS — H547 Unspecified visual loss: Secondary | ICD-10-CM | POA: Diagnosis not present

## 2019-04-04 DIAGNOSIS — Z0289 Encounter for other administrative examinations: Secondary | ICD-10-CM | POA: Diagnosis not present

## 2019-04-04 DIAGNOSIS — F05 Delirium due to known physiological condition: Secondary | ICD-10-CM | POA: Diagnosis not present

## 2019-04-04 DIAGNOSIS — M4854XS Collapsed vertebra, not elsewhere classified, thoracic region, sequela of fracture: Secondary | ICD-10-CM | POA: Diagnosis not present

## 2019-04-04 DIAGNOSIS — H9190 Unspecified hearing loss, unspecified ear: Secondary | ICD-10-CM | POA: Diagnosis not present

## 2019-04-04 DIAGNOSIS — F22 Delusional disorders: Secondary | ICD-10-CM | POA: Diagnosis not present

## 2019-05-26 DIAGNOSIS — F200 Paranoid schizophrenia: Secondary | ICD-10-CM | POA: Diagnosis not present

## 2019-05-27 DIAGNOSIS — N390 Urinary tract infection, site not specified: Secondary | ICD-10-CM | POA: Diagnosis not present

## 2019-05-27 DIAGNOSIS — R319 Hematuria, unspecified: Secondary | ICD-10-CM | POA: Diagnosis not present

## 2019-06-08 DIAGNOSIS — F22 Delusional disorders: Secondary | ICD-10-CM | POA: Diagnosis not present

## 2019-06-08 DIAGNOSIS — F05 Delirium due to known physiological condition: Secondary | ICD-10-CM | POA: Diagnosis not present

## 2019-06-08 DIAGNOSIS — U071 COVID-19: Secondary | ICD-10-CM | POA: Diagnosis not present

## 2019-06-08 DIAGNOSIS — M4854XS Collapsed vertebra, not elsewhere classified, thoracic region, sequela of fracture: Secondary | ICD-10-CM | POA: Diagnosis not present

## 2019-06-08 DIAGNOSIS — H9190 Unspecified hearing loss, unspecified ear: Secondary | ICD-10-CM | POA: Diagnosis not present

## 2019-07-21 DIAGNOSIS — F200 Paranoid schizophrenia: Secondary | ICD-10-CM | POA: Diagnosis not present

## 2019-07-25 DIAGNOSIS — B353 Tinea pedis: Secondary | ICD-10-CM | POA: Diagnosis not present

## 2019-07-25 DIAGNOSIS — F331 Major depressive disorder, recurrent, moderate: Secondary | ICD-10-CM | POA: Diagnosis not present

## 2019-07-25 DIAGNOSIS — F0390 Unspecified dementia without behavioral disturbance: Secondary | ICD-10-CM | POA: Diagnosis not present

## 2019-07-26 DIAGNOSIS — L508 Other urticaria: Secondary | ICD-10-CM | POA: Diagnosis not present

## 2019-07-26 DIAGNOSIS — F0391 Unspecified dementia with behavioral disturbance: Secondary | ICD-10-CM | POA: Diagnosis not present

## 2019-07-26 DIAGNOSIS — F22 Delusional disorders: Secondary | ICD-10-CM | POA: Diagnosis not present

## 2019-07-26 DIAGNOSIS — Z4800 Encounter for change or removal of nonsurgical wound dressing: Secondary | ICD-10-CM | POA: Diagnosis not present

## 2019-07-26 DIAGNOSIS — D509 Iron deficiency anemia, unspecified: Secondary | ICD-10-CM | POA: Diagnosis not present

## 2019-07-28 DIAGNOSIS — Z23 Encounter for immunization: Secondary | ICD-10-CM | POA: Diagnosis not present

## 2019-08-03 DIAGNOSIS — D509 Iron deficiency anemia, unspecified: Secondary | ICD-10-CM | POA: Diagnosis not present

## 2019-08-03 DIAGNOSIS — L508 Other urticaria: Secondary | ICD-10-CM | POA: Diagnosis not present

## 2019-08-03 DIAGNOSIS — F0391 Unspecified dementia with behavioral disturbance: Secondary | ICD-10-CM | POA: Diagnosis not present

## 2019-08-03 DIAGNOSIS — F22 Delusional disorders: Secondary | ICD-10-CM | POA: Diagnosis not present

## 2019-08-03 DIAGNOSIS — Z4800 Encounter for change or removal of nonsurgical wound dressing: Secondary | ICD-10-CM | POA: Diagnosis not present

## 2019-08-05 DIAGNOSIS — Z4800 Encounter for change or removal of nonsurgical wound dressing: Secondary | ICD-10-CM | POA: Diagnosis not present

## 2019-08-05 DIAGNOSIS — D509 Iron deficiency anemia, unspecified: Secondary | ICD-10-CM | POA: Diagnosis not present

## 2019-08-05 DIAGNOSIS — F0391 Unspecified dementia with behavioral disturbance: Secondary | ICD-10-CM | POA: Diagnosis not present

## 2019-08-05 DIAGNOSIS — L508 Other urticaria: Secondary | ICD-10-CM | POA: Diagnosis not present

## 2019-08-05 DIAGNOSIS — F22 Delusional disorders: Secondary | ICD-10-CM | POA: Diagnosis not present

## 2019-08-08 DIAGNOSIS — Z4800 Encounter for change or removal of nonsurgical wound dressing: Secondary | ICD-10-CM | POA: Diagnosis not present

## 2019-08-08 DIAGNOSIS — D509 Iron deficiency anemia, unspecified: Secondary | ICD-10-CM | POA: Diagnosis not present

## 2019-08-08 DIAGNOSIS — F0391 Unspecified dementia with behavioral disturbance: Secondary | ICD-10-CM | POA: Diagnosis not present

## 2019-08-08 DIAGNOSIS — L508 Other urticaria: Secondary | ICD-10-CM | POA: Diagnosis not present

## 2019-08-08 DIAGNOSIS — F331 Major depressive disorder, recurrent, moderate: Secondary | ICD-10-CM | POA: Diagnosis not present

## 2019-08-08 DIAGNOSIS — F22 Delusional disorders: Secondary | ICD-10-CM | POA: Diagnosis not present

## 2019-08-12 DIAGNOSIS — L508 Other urticaria: Secondary | ICD-10-CM | POA: Diagnosis not present

## 2019-08-12 DIAGNOSIS — F22 Delusional disorders: Secondary | ICD-10-CM | POA: Diagnosis not present

## 2019-08-12 DIAGNOSIS — D509 Iron deficiency anemia, unspecified: Secondary | ICD-10-CM | POA: Diagnosis not present

## 2019-08-12 DIAGNOSIS — Z4800 Encounter for change or removal of nonsurgical wound dressing: Secondary | ICD-10-CM | POA: Diagnosis not present

## 2019-08-12 DIAGNOSIS — F0391 Unspecified dementia with behavioral disturbance: Secondary | ICD-10-CM | POA: Diagnosis not present

## 2019-08-16 DIAGNOSIS — Z4800 Encounter for change or removal of nonsurgical wound dressing: Secondary | ICD-10-CM | POA: Diagnosis not present

## 2019-08-16 DIAGNOSIS — F22 Delusional disorders: Secondary | ICD-10-CM | POA: Diagnosis not present

## 2019-08-16 DIAGNOSIS — F0391 Unspecified dementia with behavioral disturbance: Secondary | ICD-10-CM | POA: Diagnosis not present

## 2019-08-16 DIAGNOSIS — L508 Other urticaria: Secondary | ICD-10-CM | POA: Diagnosis not present

## 2019-08-16 DIAGNOSIS — D509 Iron deficiency anemia, unspecified: Secondary | ICD-10-CM | POA: Diagnosis not present

## 2019-08-19 DIAGNOSIS — L508 Other urticaria: Secondary | ICD-10-CM | POA: Diagnosis not present

## 2019-08-19 DIAGNOSIS — D509 Iron deficiency anemia, unspecified: Secondary | ICD-10-CM | POA: Diagnosis not present

## 2019-08-19 DIAGNOSIS — F22 Delusional disorders: Secondary | ICD-10-CM | POA: Diagnosis not present

## 2019-08-19 DIAGNOSIS — F0391 Unspecified dementia with behavioral disturbance: Secondary | ICD-10-CM | POA: Diagnosis not present

## 2019-08-19 DIAGNOSIS — Z4800 Encounter for change or removal of nonsurgical wound dressing: Secondary | ICD-10-CM | POA: Diagnosis not present

## 2019-08-25 DIAGNOSIS — F22 Delusional disorders: Secondary | ICD-10-CM | POA: Diagnosis not present

## 2019-08-25 DIAGNOSIS — D509 Iron deficiency anemia, unspecified: Secondary | ICD-10-CM | POA: Diagnosis not present

## 2019-08-25 DIAGNOSIS — F0391 Unspecified dementia with behavioral disturbance: Secondary | ICD-10-CM | POA: Diagnosis not present

## 2019-08-25 DIAGNOSIS — L508 Other urticaria: Secondary | ICD-10-CM | POA: Diagnosis not present

## 2019-08-25 DIAGNOSIS — Z4800 Encounter for change or removal of nonsurgical wound dressing: Secondary | ICD-10-CM | POA: Diagnosis not present

## 2019-08-26 DIAGNOSIS — L508 Other urticaria: Secondary | ICD-10-CM | POA: Diagnosis not present

## 2019-08-26 DIAGNOSIS — F22 Delusional disorders: Secondary | ICD-10-CM | POA: Diagnosis not present

## 2019-08-26 DIAGNOSIS — D509 Iron deficiency anemia, unspecified: Secondary | ICD-10-CM | POA: Diagnosis not present

## 2019-08-26 DIAGNOSIS — F0391 Unspecified dementia with behavioral disturbance: Secondary | ICD-10-CM | POA: Diagnosis not present

## 2019-08-26 DIAGNOSIS — Z4800 Encounter for change or removal of nonsurgical wound dressing: Secondary | ICD-10-CM | POA: Diagnosis not present

## 2019-10-13 DIAGNOSIS — Z20828 Contact with and (suspected) exposure to other viral communicable diseases: Secondary | ICD-10-CM | POA: Diagnosis not present

## 2019-10-18 DIAGNOSIS — F200 Paranoid schizophrenia: Secondary | ICD-10-CM | POA: Diagnosis not present

## 2019-11-18 DIAGNOSIS — Z23 Encounter for immunization: Secondary | ICD-10-CM | POA: Diagnosis not present

## 2019-11-29 DIAGNOSIS — F331 Major depressive disorder, recurrent, moderate: Secondary | ICD-10-CM | POA: Diagnosis not present

## 2019-11-29 DIAGNOSIS — R4182 Altered mental status, unspecified: Secondary | ICD-10-CM | POA: Diagnosis not present

## 2019-11-29 DIAGNOSIS — F0390 Unspecified dementia without behavioral disturbance: Secondary | ICD-10-CM | POA: Diagnosis not present

## 2019-11-30 DIAGNOSIS — R319 Hematuria, unspecified: Secondary | ICD-10-CM | POA: Diagnosis not present

## 2019-11-30 DIAGNOSIS — N390 Urinary tract infection, site not specified: Secondary | ICD-10-CM | POA: Diagnosis not present

## 2019-12-03 DIAGNOSIS — M25552 Pain in left hip: Secondary | ICD-10-CM | POA: Diagnosis not present

## 2019-12-04 ENCOUNTER — Emergency Department (HOSPITAL_COMMUNITY)
Admission: EM | Admit: 2019-12-04 | Discharge: 2019-12-04 | Disposition: A | Discharge status: Home / Self Care | Payer: Medicare Other | Attending: Emergency Medicine | Admitting: Emergency Medicine

## 2019-12-04 ENCOUNTER — Other Ambulatory Visit: Payer: Self-pay

## 2019-12-04 ENCOUNTER — Encounter (HOSPITAL_COMMUNITY): Payer: Self-pay

## 2019-12-04 DIAGNOSIS — R2681 Unsteadiness on feet: Secondary | ICD-10-CM | POA: Diagnosis not present

## 2019-12-04 DIAGNOSIS — F22 Delusional disorders: Secondary | ICD-10-CM | POA: Diagnosis not present

## 2019-12-04 DIAGNOSIS — G8929 Other chronic pain: Secondary | ICD-10-CM | POA: Insufficient documentation

## 2019-12-04 DIAGNOSIS — R52 Pain, unspecified: Secondary | ICD-10-CM | POA: Diagnosis not present

## 2019-12-04 DIAGNOSIS — M25552 Pain in left hip: Secondary | ICD-10-CM | POA: Diagnosis not present

## 2019-12-04 DIAGNOSIS — Z87891 Personal history of nicotine dependence: Secondary | ICD-10-CM | POA: Diagnosis not present

## 2019-12-04 DIAGNOSIS — Z79899 Other long term (current) drug therapy: Secondary | ICD-10-CM | POA: Insufficient documentation

## 2019-12-04 DIAGNOSIS — Z7401 Bed confinement status: Secondary | ICD-10-CM | POA: Diagnosis not present

## 2019-12-04 DIAGNOSIS — I10 Essential (primary) hypertension: Secondary | ICD-10-CM | POA: Diagnosis not present

## 2019-12-04 DIAGNOSIS — M2550 Pain in unspecified joint: Secondary | ICD-10-CM | POA: Diagnosis not present

## 2019-12-04 DIAGNOSIS — M25559 Pain in unspecified hip: Secondary | ICD-10-CM | POA: Insufficient documentation

## 2019-12-04 NOTE — ED Provider Notes (Signed)
  Face-to-face evaluation   History: Patient reportedly acting aggressive at her facility, and has a history of chronic hip pain.  At this time the patient is alert conversant and states she is hungry.  She is able to answer questions easily.  Physical exam:, Cooperative, comfortable.  No dysarthria or aphasia.  No respiratory distress.  Patient is fairly lucid.  Medical screening examination/treatment/procedure(s) were conducted as a shared visit with non-physician practitioner(s) and myself.  I personally evaluated the patient during the encounter    Mancel Bale, MD 12/04/19 1538

## 2019-12-04 NOTE — Progress Notes (Signed)
CSW acknowledging consult.  Patient arrives from [facility] facility via EMS. Patient is medically clear for discharge. CSW called [employer] and spoke with Press photographer, who confirms patient is able to return today. Charge Nurse states patient may be transported by her daughter or EMS.   RN Call for Report: [phone]  CSW attempted to reach daughter, Nesa Distel to provide updates and to coordinate discharge planning [phone]). CSW left a HIPAA compliant voicemail with call back information.  Enid Cutter, LCSW-A Clinical Social Worker [phone]

## 2019-12-04 NOTE — ED Triage Notes (Signed)
Pt from [facility] via EMS. Staff reports that pt started to scream out this am and refused to take her medications. Staff reports that she turned over furniture and "tore her room up" and was trying to make tea on her bed. There is no known falls or injury. The hip pain is chronic in nature and has has X-rays recently with dx of arthritis. Pt has hx of dementia that seems to be worsening. Pt exhibiting more paranoid behavior lately per staff. Pt is alert to self and place. She is in no distress.

## 2019-12-04 NOTE — ED Notes (Signed)
Pt belongings w/ labels were returned to pt.

## 2019-12-04 NOTE — Discharge Instructions (Addendum)
Return here as needed.  Follow-up with Her primary doctor

## 2019-12-04 NOTE — ED Notes (Signed)
Patient given water and sandwich

## 2019-12-04 NOTE — ED Provider Notes (Signed)
[facility] DEPT Provider Note   CSN: [id number] Arrival date & time: 12/04/19  0932     History Chief Complaint  Patient presents with  . Hip Pain    Deanna Strong is a 86 y.o. female.  HPI Patient presents to the emergency department with no chief complaint other than chronic hip pain.  Patient states this morning she had pain and was having a hard time getting around her room at the nursing home and she states that she had not multiple things down trying to get to the door.  The patient states that she went to the door and yelled out for a glass of water.  The patient states that the center here because they felt she was causing disruption.  The patient denies chest pain, shortness of breath, headache,blurred vision, neck pain, fever, cough, weakness, numbness, dizziness, anorexia, edema, abdominal pain, nausea, vomiting, diarrhea, rash, back pain, dysuria, hematemesis, bloody stool, near syncope, or syncope.    Past Medical History:  Diagnosis Date  . Anemia   . Depression   . Hyperlipidemia   . Hyperthyroidism   . IBS (irritable bowel syndrome)   . Psychosis ([place]) 06/01/2018  . Strabismus     Patient Active Problem List   Diagnosis Date Noted  . Palliative care encounter 07/30/2018    History reviewed. No pertinent surgical history.   OB History   No obstetric history on file.     History reviewed. No pertinent family history.  Social History   Tobacco Use  . 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 Smokeless tobacco: Never Used  Substance Use Topics  . Alcohol use: Not on file  . Drug use: Not on file    Home Medications Prior to Admission medications   Medication Sig Start Date End Date Taking? Authorizing Provider  nystatin (MYCOSTATIN) 100000 UNIT/ML suspension Take 2.5 mLs by mouth 3 (three) times daily.    [provider]  OVER THE COUNTER MEDICATION Take 1-2 capsules by mouth daily.    [provider]     Allergies    Patient has no known allergies.  Review of Systems   Review of Systems All other systems negative except as documented in the HPI. All pertinent positives and negatives as reviewed in the HPI.  Physical Exam Updated Vital Signs BP (!) 147/84   Pulse 72   Temp 99 F (37.2 C) (Oral)   Resp 18   SpO2 97%   Physical Exam Vitals and nursing note reviewed.  Constitutional:      General: She is not in acute distress.    Appearance: She is well-developed.  HENT:     Head: Normocephalic and atraumatic.  Eyes:     Pupils: Pupils are equal, round, and reactive to light.  Cardiovascular:     Rate and Rhythm: Normal rate and regular rhythm.     Heart sounds: Normal heart sounds. No murmur. No friction rub. No gallop.   Pulmonary:     Effort: Pulmonary effort is normal. No respiratory distress.     Breath sounds: Normal breath sounds. No wheezing.  Abdominal:     General: Bowel sounds are normal. There is no distension.     Palpations: Abdomen is soft.     Tenderness: There is no abdominal tenderness.  Musculoskeletal:     Cervical back: Normal range of motion and neck supple.  Skin:    General: Skin is warm and dry.     Capillary Refill: Capillary refill  takes less than 2 seconds.     Findings: No erythema or rash.  Neurological:     Mental Status: She is alert and oriented to person, place, and time.     Motor: No abnormal muscle tone.     Coordination: Coordination normal.  Psychiatric:        Behavior: Behavior normal.     ED Results / Procedures / Treatments   Labs (all labs ordered are listed, but only abnormal results are displayed) Labs Reviewed - No data to display  EKG None  Radiology No results found.  Procedures Procedures (including critical care time)  Medications Ordered in ED Medications - No data to display  ED Course  I have reviewed the triage vital signs and the nursing notes.  Pertinent labs & imaging results that were  available during my care of the patient were reviewed by me and considered in my medical decision making (see chart for details).    MDM Rules/Calculators/A&P                      Patient will be referred back to her primary care doctor for further evaluation of her hip pain.  The patient has no significant alterations in her mental status.  Patient is speaking to me very clearly and concisely.  She describes the events that occurred this morning.  The patient will need to follow-up with her primary doctor for any further concerns. Final Clinical Impression(s) / ED Diagnoses Final diagnoses:  None    Rx / DC Orders ED Discharge Orders    None       Charlestine Night, PA-C 12/04/19 1205    Mancel Bale, MD 12/04/19 1538

## 2019-12-05 DIAGNOSIS — M160 Bilateral primary osteoarthritis of hip: Secondary | ICD-10-CM | POA: Diagnosis not present

## 2019-12-05 DIAGNOSIS — M25552 Pain in left hip: Secondary | ICD-10-CM | POA: Diagnosis not present

## 2019-12-05 DIAGNOSIS — F0390 Unspecified dementia without behavioral disturbance: Secondary | ICD-10-CM | POA: Diagnosis not present

## 2019-12-05 DIAGNOSIS — F331 Major depressive disorder, recurrent, moderate: Secondary | ICD-10-CM | POA: Diagnosis not present

## 2019-12-12 DIAGNOSIS — M2550 Pain in unspecified joint: Secondary | ICD-10-CM | POA: Diagnosis not present

## 2019-12-12 DIAGNOSIS — M160 Bilateral primary osteoarthritis of hip: Secondary | ICD-10-CM | POA: Diagnosis not present

## 2019-12-12 DIAGNOSIS — F331 Major depressive disorder, recurrent, moderate: Secondary | ICD-10-CM | POA: Diagnosis not present

## 2019-12-12 DIAGNOSIS — F0390 Unspecified dementia without behavioral disturbance: Secondary | ICD-10-CM | POA: Diagnosis not present

## 2019-12-16 DIAGNOSIS — Z23 Encounter for immunization: Secondary | ICD-10-CM | POA: Diagnosis not present

## 2020-01-12 DIAGNOSIS — F200 Paranoid schizophrenia: Secondary | ICD-10-CM | POA: Diagnosis not present

## 2020-02-08 DIAGNOSIS — F200 Paranoid schizophrenia: Secondary | ICD-10-CM | POA: Diagnosis not present

## 2020-02-29 DIAGNOSIS — F05 Delirium due to known physiological condition: Secondary | ICD-10-CM | POA: Diagnosis not present

## 2020-02-29 DIAGNOSIS — L0889 Other specified local infections of the skin and subcutaneous tissue: Secondary | ICD-10-CM | POA: Diagnosis not present

## 2020-02-29 DIAGNOSIS — B353 Tinea pedis: Secondary | ICD-10-CM | POA: Diagnosis not present

## 2020-05-14 ENCOUNTER — Emergency Department (HOSPITAL_COMMUNITY)
Admission: EM | Admit: 2020-05-14 | Discharge: 2020-05-16 | Disposition: A | Discharge status: Other Acute Inpatient Hospital | Payer: Medicare Other | Attending: Emergency Medicine | Admitting: Emergency Medicine

## 2020-05-14 ENCOUNTER — Encounter (HOSPITAL_COMMUNITY): Payer: Self-pay

## 2020-05-14 ENCOUNTER — Other Ambulatory Visit: Payer: Self-pay

## 2020-05-14 DIAGNOSIS — F22 Delusional disorders: Secondary | ICD-10-CM | POA: Insufficient documentation

## 2020-05-14 DIAGNOSIS — R45851 Suicidal ideations: Secondary | ICD-10-CM | POA: Insufficient documentation

## 2020-05-14 DIAGNOSIS — Z20822 Contact with and (suspected) exposure to covid-19: Secondary | ICD-10-CM | POA: Insufficient documentation

## 2020-05-14 DIAGNOSIS — F0390 Unspecified dementia without behavioral disturbance: Secondary | ICD-10-CM | POA: Diagnosis present

## 2020-05-14 DIAGNOSIS — M5489 Other dorsalgia: Secondary | ICD-10-CM | POA: Diagnosis not present

## 2020-05-14 DIAGNOSIS — F0391 Unspecified dementia with behavioral disturbance: Secondary | ICD-10-CM | POA: Insufficient documentation

## 2020-05-14 DIAGNOSIS — I10 Essential (primary) hypertension: Secondary | ICD-10-CM | POA: Diagnosis not present

## 2020-05-14 DIAGNOSIS — Z03818 Encounter for observation for suspected exposure to other biological agents ruled out: Secondary | ICD-10-CM | POA: Diagnosis not present

## 2020-05-14 DIAGNOSIS — R451 Restlessness and agitation: Secondary | ICD-10-CM | POA: Diagnosis present

## 2020-05-14 DIAGNOSIS — F29 Unspecified psychosis not due to a substance or known physiological condition: Secondary | ICD-10-CM

## 2020-05-14 HISTORY — DX: Unspecified dementia, unspecified severity, without behavioral disturbance, psychotic disturbance, mood disturbance, and anxiety: F03.90

## 2020-05-14 LAB — BASIC METABOLIC PANEL
Anion gap: 8 (ref 5–15)
BUN: <5 mg/dL — ABNORMAL LOW (ref 8–23)
CO2: 25 mmol/L (ref 22–32)
Calcium: 8.9 mg/dL (ref 8.9–10.3)
Chloride: 104 mmol/L (ref 98–111)
Creatinine, Ser: 0.48 mg/dL (ref 0.44–1.00)
GFR calc Af Amer: >60 mL/min (ref >60)
GFR calc non Af Amer: >60 mL/min (ref >60)
Glucose, Bld: 100 mg/dL — ABNORMAL HIGH (ref 70–99)
Potassium: 3.8 mmol/L (ref 3.5–5.1)
Sodium: 137 mmol/L (ref 135–145)

## 2020-05-14 LAB — CBC WITH DIFFERENTIAL/PLATELET
Abs Immature Granulocytes: 0.01 10*3/uL (ref 0.00–0.07)
Basophils Absolute: 0 10*3/uL (ref 0.0–0.1)
Basophils Relative: 0 %
Eosinophils Absolute: 0 10*3/uL (ref 0.0–0.5)
Eosinophils Relative: 1 %
HCT: 40.5 % (ref 36.0–46.0)
Hemoglobin: 12.9 g/dL (ref 12.0–15.0)
Immature Granulocytes: 0 %
Lymphocytes Relative: 18 %
Lymphs Abs: 1 10*3/uL (ref 0.7–4.0)
MCH: 26.4 pg (ref 26.0–34.0)
MCHC: 31.9 g/dL (ref 30.0–36.0)
MCV: 83 fL (ref 80.0–100.0)
Monocytes Absolute: 0.4 10*3/uL (ref 0.1–1.0)
Monocytes Relative: 7 %
Neutro Abs: 4.2 10*3/uL (ref 1.7–7.7)
Neutrophils Relative %: 74 %
Platelets: 264 10*3/uL (ref 150–400)
RBC: 4.88 MIL/uL (ref 3.87–5.11)
RDW: 15 % (ref 11.5–15.5)
WBC: 5.7 10*3/uL (ref 4.0–10.5)
nRBC: 0 % (ref 0.0–0.2)

## 2020-05-14 LAB — URINALYSIS, ROUTINE W REFLEX MICROSCOPIC
Bacteria, UA: NONE SEEN
Bilirubin Urine: NEGATIVE
Glucose, UA: NEGATIVE mg/dL
Ketones, ur: NEGATIVE mg/dL
Leukocytes,Ua: NEGATIVE
Nitrite: NEGATIVE
Protein, ur: NEGATIVE mg/dL
Specific Gravity, Urine: 1.005 (ref 1.005–1.030)
pH: 7 (ref 5.0–8.0)

## 2020-05-14 LAB — VALPROIC ACID LEVEL: Valproic Acid Lvl: <10 ug/mL — ABNORMAL LOW (ref 50.0–100.0)

## 2020-05-14 LAB — RAPID URINE DRUG SCREEN, HOSP PERFORMED
Amphetamines: NOT DETECTED
Barbiturates: NOT DETECTED
Benzodiazepines: NOT DETECTED
Cocaine: NOT DETECTED
Opiates: NOT DETECTED
Tetrahydrocannabinol: NOT DETECTED

## 2020-05-14 LAB — ACETAMINOPHEN LEVEL: Acetaminophen (Tylenol), Serum: <10 ug/mL — ABNORMAL LOW (ref 10–30)

## 2020-05-14 LAB — SALICYLATE LEVEL: Salicylate Lvl: <7 mg/dL — ABNORMAL LOW (ref 7.0–30.0)

## 2020-05-14 LAB — ETHANOL: Alcohol, Ethyl (B): <10 mg/dL (ref <10)

## 2020-05-14 LAB — SARS CORONAVIRUS 2 BY RT PCR (HOSPITAL ORDER, PERFORMED IN ~~LOC~~ HOSPITAL LAB): SARS Coronavirus 2: NEGATIVE

## 2020-05-14 MED ORDER — MAGNESIUM HYDROXIDE 400 MG/5ML PO SUSP
30.0000 mL | Freq: Every day | ORAL | Status: DC | PRN
  Filled 2020-05-14: qty 30

## 2020-05-14 MED ORDER — DIVALPROEX SODIUM 125 MG PO CSDR
125.0000 mg | DELAYED_RELEASE_CAPSULE | Freq: Two times a day (BID) | ORAL | Status: DC
  Administered 2020-05-15: 125 mg via ORAL
  Filled 2020-05-14 (×6): qty 1

## 2020-05-14 MED ORDER — CICLOPIROX OLAMINE 0.77 % EX CREA: 1.0000 "application " | TOPICAL_CREAM | Freq: Two times a day (BID) | CUTANEOUS | Status: DC

## 2020-05-14 MED ORDER — ACETAMINOPHEN 500 MG PO TABS: 500.0000 mg | ORAL_TABLET | ORAL | Status: DC | PRN

## 2020-05-14 MED ORDER — CLOTRIMAZOLE 1 % EX CREA
TOPICAL_CREAM | Freq: Two times a day (BID) | CUTANEOUS | Status: DC
  Filled 2020-05-14: qty 15

## 2020-05-14 MED ORDER — LOPERAMIDE HCL 2 MG PO CAPS: 2.0000 mg | ORAL_CAPSULE | ORAL | Status: DC | PRN

## 2020-05-14 MED ORDER — ALUM & MAG HYDROXIDE-SIMETH 200-200-20 MG/5ML PO SUSP: 30.0000 mL | Freq: Four times a day (QID) | ORAL | Status: DC | PRN

## 2020-05-14 NOTE — ED Notes (Signed)
EDP at bedside  

## 2020-05-14 NOTE — BH Assessment (Signed)
Case was staffed with [place] NP who recommended a inpatient admssion (Geropsychiatry) for stabilization.
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
## 2020-05-14 NOTE — ED Triage Notes (Addendum)
Pt arrives [facility] from [facility]. Per EMS: Pt stopped taking medications on Thursday and has since been threatening SI at facility. Per EMS: Pt has been walking around the facility naked. Pt has a hx of dementia at baseline. Pt reports chronic back pain. Pt has DNR.

## 2020-05-14 NOTE — ED Provider Notes (Addendum)
[facility]   CSN: [id number] Arrival date & time: 05/14/20  1141     History Chief Complaint  Patient presents with  . Suicidal    Deanna Strong is a 85 y.o. female.  86 year old female with prior medical history as detailed below presents for psychiatric evaluation.  Patient is a resident at [facility] assisted living.  Staff at her facility report that the patient has been more disruptive and more agitated over the last week.  She reportedly has essentially stopped taking all of her medications over the last week.  At several points the patient has expressed thoughts of "just dying." Staff at [facility]) provided history through phone conversation.   Upon my evaluation the patient appears to be pleasantly demented.  She denies suicidality.  She does not understand why she was sent to the ED for evaluation.  She is here on a voluntary basis.  The history is provided by the patient and the nursing home.  Mental Health Problem Presenting symptoms: agitation and suicidal thoughts   Degree of incapacity (severity):  Moderate Onset quality:  Sudden Timing:  Constant Progression:  Waxing and waning Chronicity:  New Treatment compliance:  Untreated Relieved by:  Nothing Worsened by:  Nothing      Past Medical History:  Diagnosis Date  . Anemia   . Dementia (HCC)   . Depression   . Hyperlipidemia   . Hyperthyroidism   . IBS (irritable bowel syndrome)   . Psychosis (HCC) 06/01/2018  . Strabismus     Patient Active Problem List   Diagnosis Date Noted  . Palliative care encounter 07/30/2018    History reviewed. No pertinent surgical history.   OB History   No obstetric history on file.     History reviewed. No pertinent family history.  Social History   Tobacco Use  . 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 Smokeless tobacco: Never Used  Substance Use Topics  . Alcohol use: Never  . Drug use: Never      Home Medications Prior to Admission medications   Medication Sig Start Date End Date Taking? Authorizing Provider  acetaminophen (TYLENOL) 500 MG tablet Take 500 mg by mouth every 4 (four) hours as needed for mild pain, fever or headache.   Yes [provider]  alum & mag hydroxide-simeth (MINTOX) 200-200-20 MG/5ML suspension Take 30 mLs by mouth every 6 (six) hours as needed for indigestion or heartburn.   Yes [provider]  ciclopirox (LOPROX) 0.77 % cream Apply 1 application topically 2 (two) times daily. feet   Yes [provider]  divalproex (DEPAKOTE SPRINKLE) 125 MG capsule Take 125 mg by mouth 2 (two) times daily.   Yes [provider]  guaiFENesin (ROBITUSSIN) 100 MG/5ML SOLN Take 5 mLs by mouth every 6 (six) hours as needed for cough or to loosen phlegm.   Yes [provider]  loperamide (IMODIUM) 2 MG capsule Take 2 mg by mouth as needed for diarrhea or loose stools.   Yes [provider]  magnesium hydroxide (MILK OF MAGNESIA) 400 MG/5ML suspension Take 30 mLs by mouth daily as needed for mild constipation.   Yes [provider]  neomycin-bacitracin-polymyxin (NEOSPORIN) OINT Apply 1 application topically as needed for wound care.   Yes [provider]  OVER THE COUNTER MEDICATION Take 1 tablet by mouth 2 (two) times daily. Digestive enzyme all natural   Yes [provider]    Allergies  Patient has no known allergies.  Review of Systems   Review of Systems  Psychiatric/Behavioral: Positive for agitation and suicidal ideas.  All other systems reviewed and are negative.   Physical Exam Updated Vital Signs BP (!) 142/97 (BP Location: Right Arm)   Pulse 76   Temp 99.1 F (37.3 C) (Oral)   Resp 17   Wt 49 kg   SpO2 98%   Physical Exam Vitals and nursing note reviewed.  Constitutional:      General: She is not in acute distress.    Appearance: She is well-developed.  HENT:      Head: Normocephalic and atraumatic.  Eyes:     Conjunctiva/sclera: Conjunctivae normal.     Pupils: Pupils are equal, round, and reactive to light.  Cardiovascular:     Rate and Rhythm: Normal rate and regular rhythm.     Heart sounds: Normal heart sounds.  Pulmonary:     Effort: Pulmonary effort is normal. No respiratory distress.     Breath sounds: Normal breath sounds.  Abdominal:     General: There is no distension.     Palpations: Abdomen is soft.     Tenderness: There is no abdominal tenderness.  Musculoskeletal:        General: No deformity. Normal range of motion.     Cervical back: Normal range of motion and neck supple.  Skin:    General: Skin is warm and dry.  Neurological:     Mental Status: She is alert and oriented to person, place, and time.  Psychiatric:     Comments: Patient denies SI at this time      ED Results / Procedures / Treatments   Labs (all labs ordered are listed, but only abnormal results are displayed) Labs Reviewed  SARS CORONAVIRUS 2 BY RT PCR (HOSPITAL ORDER, PERFORMED IN [facility])  URINALYSIS, ROUTINE W REFLEX MICROSCOPIC  RAPID URINE DRUG SCREEN, HOSP PERFORMED  ACETAMINOPHEN LEVEL  BASIC METABOLIC PANEL  ETHANOL  SALICYLATE LEVEL  CBC WITH DIFFERENTIAL/PLATELET  VALPROIC ACID LEVEL    EKG None  Radiology No results found.  Procedures Procedures (including critical care time)  Medications Ordered in ED Medications - No data to display  ED Course  I have reviewed the triage vital signs and the nursing notes.  Pertinent labs & imaging results that were available during my care of the patient were reviewed by me and considered in my medical decision making (see chart for details).    MDM Rules/Calculators/A&P                          MDM  Screen complete  Rilley Poulter was evaluated in Emergency Department on 05/14/2020 for the symptoms described in the history of present illness. She was evaluated in the  context of the global COVID-19 pandemic, which necessitated consideration that the patient might be at risk for infection with the SARS-CoV-2 virus that causes COVID-19. Institutional protocols and algorithms that pertain to the evaluation of patients at risk for COVID-19 are in a state of rapid change based on information released by regulatory bodies including the CDC and federal and state organizations. These policies and algorithms were followed during the patient's care in the ED.  Patient is presenting for psychiatric evaluation.  Patient with longstanding severe dementia.  According to staff at her assisted living facility the patient has started to refuse medications over the last week.  Her behavior has become more  agitated and disruptive.  She apparently has also been reportedly suicidal.  She is here on a voluntary basis.   Medical screening will be performed.    Psychiatric evaluation/TTS evaluation ordered.  She is medically clear at this time.  Final disposition dependent upon Psych/TTS plan.    Final Clinical Impression(s) / ED Diagnoses Final diagnoses:  Dementia with behavioral disturbance, unspecified dementia type [facility])    Rx / DC Orders ED Discharge Orders    None       Wynetta Fines, MD 05/14/20 1440    Wynetta Fines, MD 05/14/20 1510

## 2020-05-14 NOTE — ED Notes (Signed)
Pt in bed talking to herself.

## 2020-05-15 ENCOUNTER — Emergency Department (HOSPITAL_COMMUNITY): Payer: Medicare Other

## 2020-05-15 NOTE — Consult Note (Signed)
[facility] Psych ED Progress Note  05/15/2020 1:23 PM Deanna Strong  MRN:  [id number] Subjective: They grabbed me from my room at the house and made me get into the ambulance naked.  I am okay with being naked I am okay with him seeing me naked, as they have seen naked patients before.  When I am not okay with his and making me come here and treating me the way that they did.  Those doctors charge me more money, when I have doubled the insurance both Medicaid and Medicare I should not have been billed $250.  I stopped taking the Risperdal on my own because he had my stomach in knots.  Patient seen and case discussed during treatment team.  Originally upon awakening patient was very disorganized and overtly psychotic.  She was heard talking about lions and police, and after rambling for a few minutes she appeared to clear up.  Patient was observed placing her glasses on her face and then stated she did not need her glasses anymore because she can see everything.  She continued to remain disorganized and making bizarre statements throughout the assessment, although she remains cooperative and pleasant.  Some question she did answer appropriately.  She denies suicidal ideations and homicidal ideations.  She also denies auditory visual hallucinations.    Per daughter she stopped taking her medications in June. She stopped her Risperidone, and she never like it " she described it as a concrete feeling. The dose was very low and seemed sufficient. " I brought her to the PCP and she was prescribed Depakote 125mg  which she also refused.  Principal Problem: <principal problem not specified> Diagnosis:  Active Problems:   * No active hospital problems. *  Total Time spent with patient: 30 minutes  Past Psychiatric History: Per daughter she was diagnosed with dementia with psychotic features. She was diagnosed by Dr. and Dr. Althea Grimmer. She has had two previous admissions 07/2018 [facility] and [facility] 05/2018. No previous suicide attempts.   Past Medical History:  Past Medical History:  Diagnosis Date  . Anemia   . Dementia (HCC)   . Depression   . Hyperlipidemia   . Hyperthyroidism   . IBS (irritable bowel syndrome)   . Psychosis (HCC) 06/01/2018  . Strabismus    History reviewed. No pertinent surgical history. Family History: History reviewed. No pertinent family history. Family Psychiatric  History: Per daughter she denies Social History:  Social History   Substance and Sexual Activity  Alcohol Use Never     Social History   Substance and Sexual Activity  Drug Use Never    Social History   Socioeconomic History  . Marital status: Unknown    Spouse name: Not on file  . Number of children: Not on file  . Years of education: Not on file  . Highest education level: Not on file  Occupational History  . Not on file  Tobacco Use  . Smoking status: Former 06/03/2018  . Smokeless tobacco: Never Used  Substance and Sexual Activity  . Alcohol use: Never  . Drug use: Never  . Sexual activity: Not on file  Other Topics Concern  . Not on file  Social History Narrative  . Not on file   Social Determinants of Health   Financial Resource Strain:   . Difficulty of Paying Living Expenses:   Food Insecurity:   . Worried About Games developer in the Last Year:   . Programme researcher, broadcasting/film/video  of Food in the Last Year:   Transportation Needs:   . Freight forwarder (Medical):   Marland Kitchen Lack of Transportation (Non-Medical):   Physical Activity:   . Days of Exercise per Week:   . Minutes of Exercise per Session:   Stress:   . Feeling of Stress :   Social Connections:   . Frequency of Communication with Friends and Family:   . Frequency of Social Gatherings with Friends and Family:   . Attends Religious Services:   . Active Member of Clubs or Organizations:   . Attends Banker Meetings:   Marland Kitchen Marital Status:     Sleep: Poor  Appetite:  Poor  Current  Medications: Current Facility-Administered Medications  Medication Dose Route Frequency Provider Last Rate Last Admin  . acetaminophen (TYLENOL) tablet 500 mg  500 mg Oral Q4H PRN Wynetta Fines, MD      . alum & mag hydroxide-simeth (MAALOX/MYLANTA) 200-200-20 MG/5ML suspension 30 mL  30 mL Oral Q6H PRN Wynetta Fines, MD      . clotrimazole (LOTRIMIN) 1 % cream   Topical BID Wynetta Fines, MD      . divalproex (DEPAKOTE SPRINKLE) capsule 125 mg  125 mg Oral BID Wynetta Fines, MD   125 mg at 05/15/20 0059  . loperamide (IMODIUM) capsule 2 mg  2 mg Oral PRN Wynetta Fines, MD      . magnesium hydroxide (MILK OF MAGNESIA) suspension 30 mL  30 mL Oral Daily PRN Wynetta Fines, MD       Current Outpatient Medications  Medication Sig Dispense Refill  . acetaminophen (TYLENOL) 500 MG tablet Take 500 mg by mouth every 4 (four) hours as needed for mild pain, fever or headache.    Marland Kitchen alum & mag hydroxide-simeth (MINTOX) 200-200-20 MG/5ML suspension Take 30 mLs by mouth every 6 (six) hours as needed for indigestion or heartburn.    . ciclopirox (LOPROX) 0.77 % cream Apply 1 application topically 2 (two) times daily. feet    . divalproex (DEPAKOTE SPRINKLE) 125 MG capsule Take 125 mg by mouth 2 (two) times daily.    Marland Kitchen guaiFENesin (ROBITUSSIN) 100 MG/5ML SOLN Take 5 mLs by mouth every 6 (six) hours as needed for cough or to loosen phlegm.    . loperamide (IMODIUM) 2 MG capsule Take 2 mg by mouth as needed for diarrhea or loose stools.    . magnesium hydroxide (MILK OF MAGNESIA) 400 MG/5ML suspension Take 30 mLs by mouth daily as needed for mild constipation.    Marland Kitchen neomycin-bacitracin-polymyxin (NEOSPORIN) OINT Apply 1 application topically as needed for wound care.    Marland Kitchen OVER THE COUNTER MEDICATION Take 1 tablet by mouth 2 (two) times daily. Digestive enzyme all natural      Lab Results:  Results for orders placed or performed during the hospital encounter of 05/14/20 (from the past 48  hour(s))  Urinalysis, Routine w reflex microscopic     Status: Abnormal   Collection Time: 05/14/20 12:21 PM  Result Value Ref Range   Color, Urine YELLOW YELLOW   APPearance CLEAR CLEAR   Specific Gravity, Urine 1.005 1.005 - 1.030   pH 7.0 5.0 - 8.0   Glucose, UA NEGATIVE NEGATIVE mg/dL   Hgb urine dipstick SMALL (A) NEGATIVE   Bilirubin Urine NEGATIVE NEGATIVE   Ketones, ur NEGATIVE NEGATIVE mg/dL   Protein, ur NEGATIVE NEGATIVE mg/dL   Nitrite NEGATIVE NEGATIVE   Leukocytes,Ua NEGATIVE NEGATIVE   RBC / HPF  0-5 0 - 5 RBC/hpf   WBC, UA 0-5 0 - 5 WBC/hpf   Bacteria, UA NONE SEEN NONE SEEN   Mucus PRESENT     Comment: Performed at [facility], [address] [place], [place] [address]  Urine rapid drug screen (hosp performed)     Status: None   Collection Time: 05/14/20 12:21 PM  Result Value Ref Range   Opiates NONE DETECTED NONE DETECTED   Cocaine NONE DETECTED NONE DETECTED   Benzodiazepines NONE DETECTED NONE DETECTED   Amphetamines NONE DETECTED NONE DETECTED   Tetrahydrocannabinol NONE DETECTED NONE DETECTED   Barbiturates NONE DETECTED NONE DETECTED    Comment: (NOTE) DRUG SCREEN FOR MEDICAL PURPOSES ONLY.  IF CONFIRMATION IS NEEDED FOR ANY PURPOSE, NOTIFY LAB WITHIN 5 DAYS.  LOWEST DETECTABLE LIMITS FOR URINE DRUG SCREEN Drug Class                     Cutoff (ng/mL) Amphetamine and metabolites    1000 Barbiturate and metabolites    200 Benzodiazepine                 200 Tricyclics and metabolites     300 Opiates and metabolites        300 Cocaine and metabolites        300 THC                            50 Performed at [facility], [address] [place], [place] [address]   Acetaminophen level     Status: Abnormal   Collection Time: 05/14/20 12:21 PM  Result Value Ref Range   Acetaminophen (Tylenol), Serum <10 (L) 10 - 30 ug/mL    Comment: (NOTE) Therapeutic concentrations vary significantly. A range of 10-30  ug/mL  may be an effective concentration for many patients. However, some  are best treated at concentrations outside of this range. Acetaminophen concentrations >150 ug/mL at 4 hours after ingestion  and >50 ug/mL at 12 hours after ingestion are often associated with  toxic reactions.  Performed at [facility], [address] [place], [place] [address]   Basic metabolic panel     Status: Abnormal   Collection Time: 05/14/20 12:21 PM  Result Value Ref Range   Sodium 137 135 - 145 mmol/L   Potassium 3.8 3.5 - 5.1 mmol/L   Chloride 104 98 - 111 mmol/L   CO2 25 22 - 32 mmol/L   Glucose, Bld 100 (H) 70 - 99 mg/dL    Comment: Glucose reference range applies only to samples taken after fasting for at least 8 hours.   BUN <5 (L) 8 - 23 mg/dL   Creatinine, Ser 9.140.48 0.44 - 1.00 mg/dL   Calcium 8.9 8.9 - 78.210.3 mg/dL   GFR calc non Af Amer >60 >60 mL/min   GFR calc Af Amer >60 >60 mL/min   Anion gap 8 5 - 15    Comment: Performed at [facility], [address] [place], [place] [address]  Ethanol     Status: None   Collection Time: 05/14/20 12:21 PM  Result Value Ref Range   Alcohol, Ethyl (B) <10 <10 mg/dL    Comment: (NOTE) Lowest detectable limit for serum alcohol is 10 mg/dL.  For medical purposes only. Performed at [facility], [address] [place], [place] [address]   Salicylate level  Status: Abnormal   Collection Time: 05/14/20 12:21 PM  Result Value Ref Range   Salicylate Lvl <7.0 (L) 7.0 - 30.0 mg/dL    Comment: Performed at [facility], [address], [place], [place] [address]  CBC with Differential     Status: None   Collection Time: 05/14/20 12:21 PM  Result Value Ref Range   WBC 5.7 4.0 - 10.5 K/uL   RBC 4.88 3.87 - 5.11 MIL/uL   Hemoglobin 12.9 12.0 - 15.0 g/dL   HCT 65.7 36 - 46 %   MCV 83.0 80.0 - 100.0 fL   MCH 26.4 26.0 - 34.0 pg   MCHC 31.9 30.0 - 36.0 g/dL   RDW 84.6  96.2 - 95.2 %   Platelets 264 150 - 400 K/uL   nRBC 0.0 0.0 - 0.2 %   Neutrophils Relative % 74 %   Neutro Abs 4.2 1.7 - 7.7 K/uL   Lymphocytes Relative 18 %   Lymphs Abs 1.0 0.7 - 4.0 K/uL   Monocytes Relative 7 %   Monocytes Absolute 0.4 0 - 1 K/uL   Eosinophils Relative 1 %   Eosinophils Absolute 0.0 0 - 0 K/uL   Basophils Relative 0 %   Basophils Absolute 0.0 0 - 0 K/uL   Immature Granulocytes 0 %   Abs Immature Granulocytes 0.01 0.00 - 0.07 K/uL    Comment: Performed at [facility], [address] [place], [place] [address]  Valproic acid level     Status: Abnormal   Collection Time: 05/14/20 12:43 PM  Result Value Ref Range   Valproic Acid Lvl <10 (L) 50.0 - 100.0 ug/mL    Comment: RESULTS CONFIRMED BY MANUAL DILUTION Performed at [facility], [address], [place], [place] [address]   SARS Coronavirus 2 by RT PCR (hospital order, performed in [facility] hospital lab) Nasopharyngeal Nasopharyngeal Swab     Status: None   Collection Time: 05/14/20  1:24 PM   Specimen: Nasopharyngeal Swab  Result Value Ref Range   SARS Coronavirus 2 NEGATIVE NEGATIVE    Comment: (NOTE) SARS-CoV-2 target nucleic acids are NOT DETECTED.  The SARS-CoV-2 RNA is generally detectable in upper and lower respiratory specimens during the acute phase of infection. The lowest concentration of SARS-CoV-2 viral copies this assay can detect is 250 copies / mL. A negative result does not preclude SARS-CoV-2 infection and should not be used as the sole basis for treatment or other patient management decisions.  A negative result may occur with improper specimen collection / handling, submission of specimen other than nasopharyngeal swab, presence of viral mutation(s) within the areas targeted by this assay, and inadequate number of viral copies (<250 copies / mL). A negative result must be combined with clinical observations, patient history, and  epidemiological information.  Fact Sheet for Patients:   BoilerBrush.com.cy  Fact Sheet for Healthcare Providers: https://pope.com/  This test is not yet approved or  cleared by the [place] FDA and has been authorized for detection and/or diagnosis of SARS-CoV-2 by FDA under an Emergency Use Authorization (EUA).  This EUA will remain in effect (meaning this test can be used) for the duration of the COVID-19 declaration under Section 564(b)(1) of the Act, 21 U.S.C. section 360bbb-3(b)(1), unless the authorization is terminated or revoked sooner.  Performed at [facility], [address] [place], [place] [address]     Blood Alcohol level:  Lab Results  Component Value Date  ETH <10 05/14/2020   ETH <10 06/01/2018    Physical Findings: AIMS:  , ,  ,  ,    CIWA:    COWS:     Musculoskeletal: Strength & Muscle Tone: within normal limits Gait & Station: normal Patient leans: N/A  Psychiatric Specialty Exam: Physical Exam  Review of Systems  Blood pressure (!) 179/94, pulse 73, temperature 98.4 F (36.9 C), temperature source Oral, resp. rate 20, weight 49 kg, SpO2 99 %.There is no height or weight on file to calculate BMI.  General Appearance: Disheveled and Guarded  Eye Contact:  Good  Speech:  Pressured  Volume:  Increased  Mood:  Euphoric and Irritable  Affect:  Full Range  Thought Process:  Disorganized, Irrelevant and Descriptions of Associations: Loose, circumstantial at times   Orientation:  Other:  alert and oriented to self and place  Thought Content:  Illogical, Delusions, Hallucinations: Auditory, Paranoid Ideation, Rumination and Tangential  Suicidal Thoughts:  No  Homicidal Thoughts:  No  Memory:  Immediate;   Fair Recent;   Poor  Judgement:  Impaired  Insight:  Lacking  Psychomotor Activity:  Normal  Concentration:  Concentration: Fair and Attention Span: Fair  Recall:  [employer] of Knowledge:  Fair  Language:  Fair  Akathisia:  No  Handed:  Right  AIMS (if indicated):     Assets:  Communication Skills Desire for Improvement Financial Resources/Insurance Housing Leisure Time Social Support  ADL's:  Intact  Cognition:  WNL  Sleep:         Treatment Plan Summary: Daily contact with patient to assess and evaluate symptoms and progress in treatment, Medication management and Plan Restart Risperdal 0.5 mg p.o. nightly for management of dementia with behavioral disturbances and psychosis.  Per daughter patient has tolerated Risperdal in the past and recently discontinued this medication about 3 weeks ago.  Abrupt discontinuation of this antipsychotic could have precipitated these current events leading up to her current state of psychosis and hospital admission.  Will recommend geriatric psychiatric facility at this time.  All appropriate labs have been obtained and all determined to be within normal, so at this time we can exclude any infectious process leading to her current state of psychosis.  Maryagnes Amos, FNP 05/15/2020, 1:23 PM

## 2020-05-15 NOTE — BH Assessment (Signed)
[facility] Assessment Progress Note  Per Caryn Bee, DNP, this pt requires psychiatric hospitalization at a facility providing specialty geriatric psychiatry services at this time.  EDP Gwyneth Sprout, MD also finds that pt meets criteria for IVC, which she has initiated.  IVC documents have been faxed to [facility], and at 15:29 Hart Carwin confirms receipt.  She has since faxed Findings and Custody Order to this Clinical research associate.  At 15:37 I called [employer] and spoke to [employer], who took demographic information, agreeing to dispatch law enforcement to fill out Return of Service.  Law enforcement then presented at [facility], completing Return of Service.  The following facilities have been contacted to seek placement for this pt, with results as noted:  Beds available, information sent, decision pending: [facility]  Not referred: [facility] (dementia is exclusionary) [facility] (dementia is exclusionary) [facility] (dementia is exclusionary) [facility] (dementia is exclusionary) [place] (dementia is exclusionary)  At capacity: [facility]   [place], [place] [id number] Health Coordinator [phone]

## 2020-05-15 NOTE — ED Notes (Addendum)
Asher Muir from [facility] called for an update on pt. Told he we are waiting on geropsych placement for stabilization.

## 2020-05-16 ENCOUNTER — Encounter (HOSPITAL_COMMUNITY): Payer: Self-pay | Admitting: Registered Nurse

## 2020-05-16 DIAGNOSIS — F0390 Unspecified dementia without behavioral disturbance: Secondary | ICD-10-CM | POA: Diagnosis present

## 2020-05-16 MED ORDER — OLANZAPINE 5 MG PO TBDP
5.0000 mg | ORAL_TABLET | ORAL | Status: DC
  Filled 2020-05-16: qty 1

## 2020-05-16 MED ORDER — OLANZAPINE 10 MG IM SOLR: 5.0000 mg | INTRAMUSCULAR | Status: DC

## 2020-05-16 NOTE — Progress Notes (Signed)
Pt ambulated off unit with one staff member, refused mask and wheelchair. Gait steady. Daughter here for transport to facility

## 2020-05-16 NOTE — Discharge Instructions (Signed)
For your behavioral health needs, you are advised to continue treatment with your current outpatient provider. °

## 2020-05-16 NOTE — Progress Notes (Signed)
Report called to [place] at [facility]. Awaiting daughter for transport to facility. ETA one hour

## 2020-05-16 NOTE — [facility] Suicide Risk Assessment (Cosign Needed)
Suicide Risk Assessment  Discharge Assessment   [facility] Discharge Suicide Risk Assessment   Principal Problem: Dementia [facility]) Discharge Diagnoses: Principal Problem:   Dementia (HCC) Active Problems:   Psychosis (HCC)   Total Time spent with patient: 30 minutes  Musculoskeletal: Strength & Muscle Tone: within normal limits Gait & Station: normal Patient leans: N/A  Psychiatric Specialty Exam: Physical Exam Vitals and nursing note reviewed. Exam conducted with a chaperone present.  Constitutional:      Appearance: Normal appearance.  Pulmonary:     Effort: Pulmonary effort is normal.  Musculoskeletal:        General: Normal range of motion.  Skin:    General: Skin is warm and dry.  Neurological:     Mental Status: She is alert.  Psychiatric:        Attention and Perception: Attention and perception normal.        Mood and Affect: Mood and affect normal.        Speech: Speech normal.        Behavior: Behavior normal. Behavior is cooperative.        Thought Content: Thought content normal. Thought content is not paranoid or delusional. Thought content does not include homicidal or suicidal ideation.        Cognition and Memory: Cognition and memory normal.        Judgment: Judgment is impulsive.     Comments: Patient was seen talking to herself while in room alone; but when approach patient talking out loud about an object in the room; when asked what she was doing stating that she was investigating the object and why ("looking like something has been removed")    Review of Systems  Psychiatric/Behavioral: Depression: Denies. Hallucinations: Denies. Substance abuse: Denies. Suicidal ideas: Denies. Insomnia: States that she is sleeping and slept well last night since being in the hospital.        Patient states "I made a statement about wanting to go to heaven to be with the lord when I die and I guess that took that as me being suicidal; but I am not going to kill my or do  nothing to hurt myself to get there."  ____ All other systems reviewed and are negative.   Blood pressure (!) 164/89, pulse 70, temperature 98.2 F (36.8 C), temperature source Oral, resp. rate 15, weight 49 kg, SpO2 97 %.There is no height or weight on file to calculate BMI.  General Appearance: Casual  Eye Contact::  Good  Speech:  Clear and Coherent and Normal Rate409  Volume:  Normal  Mood:  "I'm fine"  Affect:  Appropriate and Congruent  Thought Process:  Coherent, Goal Directed and Descriptions of Associations: Intact  Orientation:  Full (Time, Place, and Person)  Thought Content:  WDL  Suicidal Thoughts:  No  Homicidal Thoughts:  No  Memory:  Immediate;   Good Recent;   Good  Judgement:  Intact  Insight:  Present; patient does have some illogic insight about wanting to return home If she was to get home health aide.  Daughters do feel that she is safe to stay home a lone and reporting that she will be returning back to the nursing home.    Psychomotor Activity:  Normal  Concentration:  Fair  Recall:  Good  Fund of Knowledge:Good  Language: Good  Akathisia:  No  Handed:  Right  AIMS (if indicated):     Assets:  Communication Skills Desire for Improvement Housing Social Support  Sleep:     Cognition: WNL  ADL's:  Intact   Mental Status Per Nursing Assessment::   On Admission:    Deanna Strong, 86 y.o., female patient seen face to face by this provider, consulted with Dr. Lucianne Muss; and chart reviewed on 05/16/20.  On evaluation Sherita Mcquary is standing facing the wall touching an area where something has been removed and talking to herself describing what she is seeing "When looking at wall something in this area has been removed from here appears it has been removed along with hinges."  When asked what she was doing patient stated "I was investigating and then show what she was investigating."  Patient states that she is in the hospital because "I made a statement about wanting  to go to heaven to be with the lord when I die and I guess that took that as me being suicidal; but I am not going to kill my or do nothing to hurt myself to get there." Patient states that she is not going to take any medication because she feels better without it.  States that she stopped taking her medication about 3 weeks ago and "I don't want to take any medications.  I know my rights.  And I know that you are going to send me to another hospital but it will be the same there.  I am not going to take any medication."  Patient was able to name her current place, city, county, state, country, and give the name of the last 3 presidents.  Patient was able to carry on a conversation logically and she is also aware of why they want her to take the medications but still she feels she doesn't need or want to take."  Patient stating that she is living in a nursing home with locked doors and able to go outside into an inclosed court yard but she wants to go back home and assumes if she is able to get a health care nurse she can move back home.  Patient also states "I get agitated with the staff (referring to staff at nursing home) they are in your room; in/out, in/out, and sometimes there will be 3 of them just standing around talking and when you do want to ask them something they just walk off; like you're not important."         During evaluation Valmai Beauregard is alert/oriented x 4; calm/cooperative; and mood is congruent with affect.  She does not appear to be responding to internal/external stimuli or delusional thoughts.  Patient denies suicidal/self-harm/homicidal ideation, psychosis, and paranoia.  Patient answered question appropriately. Collateral information:  Spoke to the daughter of patient Makayia Duplessis at [phone] who informed that patient would have to return to the nursing facility; states that she and her sister doesn't feel that patient is safe to live at home alone.  States that they have been  having trouble with patient taking her mediation.  Explained that patient is not a danger to self or other; patient has been calm and cooperative here in hospital and has taken one dose of her Depakote sprinkles but has refused everything else and unable to force medications on patient related to patient competent know what medications are and result of not taking but not wanting anyway.       Demographic Factors:  Caucasian  Loss Factors: NA  Historical Factors: Impulsivity  Risk Reduction Factors:   Religious beliefs about death, Living with another person, especially a relative, Positive  social support and Positive therapeutic relationship  Continued Clinical Symptoms:  Previous Psychiatric Diagnoses and Treatments  Cognitive Features That Contribute To Risk:  None    Suicide Risk:  Minimal: No identifiable suicidal ideation.  Patients presenting with no risk factors but with morbid ruminations; may be classified as minimal risk based on the severity of the depressive symptoms  Disposition:  Psychiatrically cleared No evidence of imminent risk to self or others at present.   Patient does not meet criteria for psychiatric inpatient admission. Supportive therapy provided about ongoing stressors. Discussed crisis plan, support from social network, calling 911, coming to the Emergency Department, and calling Suicide Hotline.  Plan Of Care/Follow-up recommendations:  Activity:  As tolerated Diet:  Heart healthy   Follow-up Information    Schedule an appointment as soon as possible for a visit  with Pa, [facility].   Contact information: [address] [place] [address] [phone]               Assunta Found, NP 05/16/2020, 1:20 PM

## 2020-05-16 NOTE — Progress Notes (Signed)
TOC CM received referral for pt to dc back to [facility]. Contacted pt's dtr, Okey Regal. States [employer] has given permission for patient to return. TOC CM contacted [facility] Admission Coordinator, Ninetta Lights. States pt can return to her apt. Provided ED RN with number to call report. # [phone]. Daughter will pick pt up at 5 pm.  Isidoro Donning RN CCM, [facility] ED TOC CM [phone]

## 2020-05-16 NOTE — ED Provider Notes (Addendum)
Emergency Medicine Observation Re-evaluation Note  Brittane Grudzinski is a 86 y.o. female, seen on rounds today.  Pt initially presented to the ED for complaints of Suicidal Currently, the patient is awaiting geropsych placement.  Physical Exam  BP (!) 143/91 (BP Location: Left Arm)   Pulse 78   Temp 98.2 F (36.8 C) (Oral)   Resp 15   Wt 49 kg   SpO2 99%  Physical Exam Sleeping soundly ED Course / MDM  EKG:    I have reviewed the labs performed to date as well as medications administered while in observation.  Recent changes in the last 24 hours include none. Plan  Current plan is for geropsych placement. Patient is not under full IVC at this time.   11:44 AM Spoke with Psych, patient has been assessed and cleared for discharge back to her SNF. No indication for admission, she has ability to understand and refuse medications.    Pollyann Savoy, MD 05/16/20 1145

## 2020-05-16 NOTE — BH Assessment (Signed)
[facility] Assessment Progress Note   Per Shuvon Rankin, FNP, this pt no longer requires psychiatric hospitalization.  Pt presents under IVC initiated by EDP Gwyneth Sprout, MD, which has been rescinded by Nelly Rout, MD.  Pt is to be discharged from [facility] with recommendation to continue treatment with her current unspecified outpatient provider.  This has been included in pt's discharge instructions.  A social work consult has been ordered to address pt's psychosocial needs.  Pt's nurse has been notified.  Doylene Canning, MA Triage Specialist [phone]

## 2020-05-31 DIAGNOSIS — F918 Other conduct disorders: Secondary | ICD-10-CM | POA: Diagnosis not present

## 2020-05-31 DIAGNOSIS — F22 Delusional disorders: Secondary | ICD-10-CM | POA: Diagnosis not present

## 2020-05-31 DIAGNOSIS — E0590 Thyrotoxicosis, unspecified without thyrotoxic crisis or storm: Secondary | ICD-10-CM | POA: Diagnosis not present

## 2020-05-31 DIAGNOSIS — F05 Delirium due to known physiological condition: Secondary | ICD-10-CM | POA: Diagnosis not present

## 2020-07-26 DIAGNOSIS — Z23 Encounter for immunization: Secondary | ICD-10-CM | POA: Diagnosis not present

## 2020-08-07 DIAGNOSIS — Z20828 Contact with and (suspected) exposure to other viral communicable diseases: Secondary | ICD-10-CM | POA: Diagnosis not present

## 2020-08-16 DIAGNOSIS — Z20828 Contact with and (suspected) exposure to other viral communicable diseases: Secondary | ICD-10-CM | POA: Diagnosis not present

## 2020-09-24 ENCOUNTER — Emergency Department (HOSPITAL_COMMUNITY): Payer: Medicare Other

## 2020-09-24 ENCOUNTER — Other Ambulatory Visit: Payer: Self-pay

## 2020-09-24 ENCOUNTER — Encounter (HOSPITAL_COMMUNITY): Payer: Self-pay | Admitting: *Deleted

## 2020-09-24 ENCOUNTER — Inpatient Hospital Stay (HOSPITAL_COMMUNITY)
Admission: EM | Admit: 2020-09-24 | Discharge: 2020-10-02 | DRG: 480 | Disposition: A | Discharge status: Skilled Nursing Facility | Payer: Medicare Other | Attending: Internal Medicine | Admitting: Internal Medicine

## 2020-09-24 DIAGNOSIS — S7290XA Unspecified fracture of unspecified femur, initial encounter for closed fracture: Secondary | ICD-10-CM | POA: Diagnosis present

## 2020-09-24 DIAGNOSIS — D696 Thrombocytopenia, unspecified: Secondary | ICD-10-CM | POA: Diagnosis not present

## 2020-09-24 DIAGNOSIS — Z87891 Personal history of nicotine dependence: Secondary | ICD-10-CM | POA: Diagnosis not present

## 2020-09-24 DIAGNOSIS — G459 Transient cerebral ischemic attack, unspecified: Secondary | ICD-10-CM | POA: Diagnosis not present

## 2020-09-24 DIAGNOSIS — F29 Unspecified psychosis not due to a substance or known physiological condition: Secondary | ICD-10-CM | POA: Diagnosis present

## 2020-09-24 DIAGNOSIS — F0150 Vascular dementia without behavioral disturbance: Secondary | ICD-10-CM | POA: Diagnosis not present

## 2020-09-24 DIAGNOSIS — R2681 Unsteadiness on feet: Secondary | ICD-10-CM | POA: Diagnosis present

## 2020-09-24 DIAGNOSIS — M898X9 Other specified disorders of bone, unspecified site: Secondary | ICD-10-CM | POA: Diagnosis present

## 2020-09-24 DIAGNOSIS — I6523 Occlusion and stenosis of bilateral carotid arteries: Secondary | ICD-10-CM | POA: Diagnosis not present

## 2020-09-24 DIAGNOSIS — Z515 Encounter for palliative care: Secondary | ICD-10-CM | POA: Diagnosis not present

## 2020-09-24 DIAGNOSIS — S7292XD Unspecified fracture of left femur, subsequent encounter for closed fracture with routine healing: Secondary | ICD-10-CM | POA: Diagnosis not present

## 2020-09-24 DIAGNOSIS — S72142A Displaced intertrochanteric fracture of left femur, initial encounter for closed fracture: Secondary | ICD-10-CM | POA: Diagnosis not present

## 2020-09-24 DIAGNOSIS — F32A Depression, unspecified: Secondary | ICD-10-CM | POA: Diagnosis present

## 2020-09-24 DIAGNOSIS — I2699 Other pulmonary embolism without acute cor pulmonale: Secondary | ICD-10-CM | POA: Diagnosis not present

## 2020-09-24 DIAGNOSIS — R296 Repeated falls: Secondary | ICD-10-CM | POA: Diagnosis present

## 2020-09-24 DIAGNOSIS — F0390 Unspecified dementia without behavioral disturbance: Secondary | ICD-10-CM | POA: Diagnosis present

## 2020-09-24 DIAGNOSIS — W19XXXA Unspecified fall, initial encounter: Secondary | ICD-10-CM | POA: Diagnosis not present

## 2020-09-24 DIAGNOSIS — E785 Hyperlipidemia, unspecified: Secondary | ICD-10-CM | POA: Diagnosis present

## 2020-09-24 DIAGNOSIS — R488 Other symbolic dysfunctions: Secondary | ICD-10-CM | POA: Diagnosis present

## 2020-09-24 DIAGNOSIS — R2689 Other abnormalities of gait and mobility: Secondary | ICD-10-CM | POA: Diagnosis present

## 2020-09-24 DIAGNOSIS — Z7401 Bed confinement status: Secondary | ICD-10-CM | POA: Diagnosis not present

## 2020-09-24 DIAGNOSIS — I361 Nonrheumatic tricuspid (valve) insufficiency: Secondary | ICD-10-CM | POA: Diagnosis not present

## 2020-09-24 DIAGNOSIS — T148XXA Other injury of unspecified body region, initial encounter: Secondary | ICD-10-CM

## 2020-09-24 DIAGNOSIS — I6501 Occlusion and stenosis of right vertebral artery: Secondary | ICD-10-CM | POA: Diagnosis not present

## 2020-09-24 DIAGNOSIS — S72352A Displaced comminuted fracture of shaft of left femur, initial encounter for closed fracture: Secondary | ICD-10-CM | POA: Diagnosis not present

## 2020-09-24 DIAGNOSIS — Z7189 Other specified counseling: Secondary | ICD-10-CM | POA: Diagnosis not present

## 2020-09-24 DIAGNOSIS — M6281 Muscle weakness (generalized): Secondary | ICD-10-CM | POA: Diagnosis present

## 2020-09-24 DIAGNOSIS — Z20822 Contact with and (suspected) exposure to covid-19: Secondary | ICD-10-CM | POA: Diagnosis present

## 2020-09-24 DIAGNOSIS — S7292XA Unspecified fracture of left femur, initial encounter for closed fracture: Secondary | ICD-10-CM | POA: Diagnosis present

## 2020-09-24 DIAGNOSIS — R29818 Other symptoms and signs involving the nervous system: Secondary | ICD-10-CM | POA: Diagnosis not present

## 2020-09-24 DIAGNOSIS — Z66 Do not resuscitate: Secondary | ICD-10-CM | POA: Diagnosis not present

## 2020-09-24 DIAGNOSIS — Y92129 Unspecified place in nursing home as the place of occurrence of the external cause: Secondary | ICD-10-CM

## 2020-09-24 DIAGNOSIS — Z419 Encounter for procedure for purposes other than remedying health state, unspecified: Secondary | ICD-10-CM

## 2020-09-24 DIAGNOSIS — M2550 Pain in unspecified joint: Secondary | ICD-10-CM | POA: Diagnosis not present

## 2020-09-24 DIAGNOSIS — E559 Vitamin D deficiency, unspecified: Secondary | ICD-10-CM | POA: Diagnosis present

## 2020-09-24 DIAGNOSIS — S7290XD Unspecified fracture of unspecified femur, subsequent encounter for closed fracture with routine healing: Secondary | ICD-10-CM | POA: Diagnosis not present

## 2020-09-24 DIAGNOSIS — M80052A Age-related osteoporosis with current pathological fracture, left femur, initial encounter for fracture: Secondary | ICD-10-CM | POA: Diagnosis not present

## 2020-09-24 DIAGNOSIS — W1830XA Fall on same level, unspecified, initial encounter: Secondary | ICD-10-CM | POA: Diagnosis present

## 2020-09-24 DIAGNOSIS — F0391 Unspecified dementia with behavioral disturbance: Secondary | ICD-10-CM | POA: Diagnosis present

## 2020-09-24 DIAGNOSIS — S72302A Unspecified fracture of shaft of left femur, initial encounter for closed fracture: Secondary | ICD-10-CM | POA: Diagnosis not present

## 2020-09-24 DIAGNOSIS — D649 Anemia, unspecified: Secondary | ICD-10-CM | POA: Diagnosis not present

## 2020-09-24 DIAGNOSIS — S72002A Fracture of unspecified part of neck of left femur, initial encounter for closed fracture: Secondary | ICD-10-CM | POA: Diagnosis not present

## 2020-09-24 DIAGNOSIS — D62 Acute posthemorrhagic anemia: Secondary | ICD-10-CM | POA: Diagnosis not present

## 2020-09-24 DIAGNOSIS — R531 Weakness: Secondary | ICD-10-CM | POA: Diagnosis not present

## 2020-09-24 DIAGNOSIS — R278 Other lack of coordination: Secondary | ICD-10-CM | POA: Diagnosis present

## 2020-09-24 DIAGNOSIS — S79912A Unspecified injury of left hip, initial encounter: Secondary | ICD-10-CM | POA: Diagnosis not present

## 2020-09-24 DIAGNOSIS — R52 Pain, unspecified: Secondary | ICD-10-CM | POA: Diagnosis not present

## 2020-09-24 LAB — RESP PANEL BY RT-PCR (FLU A&B, COVID) ARPGX2
Influenza A by PCR: NEGATIVE
Influenza B by PCR: NEGATIVE
SARS Coronavirus 2 by RT PCR: NEGATIVE

## 2020-09-24 LAB — COMPREHENSIVE METABOLIC PANEL
ALT: 16 U/L (ref 0–44)
AST: 23 U/L (ref 15–41)
Albumin: 3.9 g/dL (ref 3.5–5.0)
Alkaline Phosphatase: 78 U/L (ref 38–126)
Anion gap: 11 (ref 5–15)
BUN: 7 mg/dL — ABNORMAL LOW (ref 8–23)
CO2: 23 mmol/L (ref 22–32)
Calcium: 8.6 mg/dL — ABNORMAL LOW (ref 8.9–10.3)
Chloride: 101 mmol/L (ref 98–111)
Creatinine, Ser: 0.48 mg/dL (ref 0.44–1.00)
GFR, Estimated: >60 mL/min (ref >60)
Glucose, Bld: 161 mg/dL — ABNORMAL HIGH (ref 70–99)
Potassium: 3.6 mmol/L (ref 3.5–5.1)
Sodium: 135 mmol/L (ref 135–145)
Total Bilirubin: 0.9 mg/dL (ref 0.3–1.2)
Total Protein: 6.3 g/dL — ABNORMAL LOW (ref 6.5–8.1)

## 2020-09-24 LAB — CBC
HCT: 37.6 % (ref 36.0–46.0)
Hemoglobin: 11.9 g/dL — ABNORMAL LOW (ref 12.0–15.0)
MCH: 26.7 pg (ref 26.0–34.0)
MCHC: 31.6 g/dL (ref 30.0–36.0)
MCV: 84.5 fL (ref 80.0–100.0)
Platelets: 286 10*3/uL (ref 150–400)
RBC: 4.45 MIL/uL (ref 3.87–5.11)
RDW: 14.4 % (ref 11.5–15.5)
WBC: 11.9 10*3/uL — ABNORMAL HIGH (ref 4.0–10.5)
nRBC: 0 % (ref 0.0–0.2)

## 2020-09-24 MED ORDER — MORPHINE SULFATE (PF) 2 MG/ML IV SOLN: 0.5000 mg | INTRAVENOUS | Status: DC | PRN

## 2020-09-24 NOTE — ED Notes (Signed)
Patient refusing lab work and Museum/gallery exhibitions officer. Pilar Plate, EDP made aware.  Patient's daughter will be here in 30 mins, will try again when she arrives.

## 2020-09-24 NOTE — ED Notes (Signed)
Patient can have water per [facility], EDP.

## 2020-09-24 NOTE — H&P (Signed)
History and Physical    Deanna OrleansJutta Reczek ZOX:[id number] DOB: 04/09/1934 DOA: 09/24/2020  PCP: Daiva NakayamaPa, [facility]  Patient coming from: Skilled nursing facility.  Chief Complaint: Fall.  History obtained from ER physician.  HPI: Deanna Strong is a 86 y.o. female with history of dementia with psychosis was brought to the ER after patient had a ground-level fall.  Since then patient was complaining of left thigh pain.  ED Course: In the ER x-rays revealed distal left femur fracture.  On-call orthopedic surgeon Dr. Carola FrostHandy has been consulted and [facility].  Patient has still was refusing the surgery and as per the surgeon patient will have long-term pain and her mobility will be impacted.  Since patient does not have the decision-making capacity patient's daughter gave the consent for the surgery.  Labs show WBC of 11.9 hemoglobin 11.9 Covid test negative.  Review of Systems: As per HPI, rest all negative.   Past Medical History:  Diagnosis Date  . Anemia   . Dementia (HCC)   . Depression   . Hyperlipidemia   . Hyperthyroidism   . IBS (irritable bowel syndrome)   . Psychosis (HCC) 06/01/2018  . Strabismus     History reviewed. No pertinent surgical history.   reports that she has quit smoking. She has never used smokeless tobacco. She reports that she does not drink alcohol and does not use drugs.  No Known Allergies  Family History  Family history unknown: Yes    Prior to Admission medications   Medication Sig Start Date End Date Taking? Authorizing Provider  acetaminophen (TYLENOL) 500 MG tablet Take 500 mg by mouth every 4 (four) hours as needed for mild pain, fever or headache.   Yes [provider]  alum & mag hydroxide-simeth (MINTOX) 200-200-20 MG/5ML suspension Take 30 mLs by mouth every 6 (six) hours as needed for indigestion or heartburn.   Yes [provider]  ciclopirox (LOPROX) 0.77 % cream Apply 1 application  topically 2 (two) times daily. Apply to feet for fungal infection   Yes [provider]  guaiFENesin (ROBITUSSIN) 100 MG/5ML SOLN Take 10 mLs by mouth every 6 (six) hours as needed for cough or to loosen phlegm.    Yes [provider]  loperamide (IMODIUM) 2 MG capsule Take 2 mg by mouth daily as needed for diarrhea or loose stools.    Yes [provider]  magnesium hydroxide (MILK OF MAGNESIA) 400 MG/5ML suspension Take 30 mLs by mouth daily as needed for mild constipation.   Yes [provider]  neomycin-bacitracin-polymyxin (NEOSPORIN) OINT Apply 1 application topically daily as needed for wound care.    Yes [provider]  OVER THE COUNTER MEDICATION Take 1 tablet by mouth 2 (two) times daily. Digestive enzyme all natural   Yes [provider]    Physical Exam: Constitutional: Moderately built and nourished. Vitals:   09/24/20 1900 09/24/20 1915 09/24/20 1930 09/24/20 1945  BP: (!) 141/111  (!) 145/117   Pulse: 80 83 80 82  Resp: 19 20 18  (!) 25  Temp:      TempSrc:      SpO2: 100% 100% 100% 100%   Eyes: Anicteric no pallor. ENMT: No discharge from the ears eyes nose or mouth. Neck: No mass felt.  No neck rigidity. Respiratory: No rhonchi or crepitations. Cardiovascular: S1-S2 heard. Abdomen: Soft nontender bowel sounds present. Musculoskeletal: Mild swelling of the left eye. Skin: No rash. Neurologic: Alert awake oriented to name  and place.  Moves all extremities. Psychiatric: Oriented to name and place.   Labs on Admission: I have personally reviewed following labs and imaging studies  CBC: Recent Labs  Lab 09/24/20 1840  WBC 11.9*  HGB 11.9*  HCT 37.6  MCV 84.5  PLT 286   Basic Metabolic Panel: Recent Labs  Lab 09/24/20 1840  NA 135  K 3.6  CL 101  CO2 23  GLUCOSE 161*  BUN 7*  CREATININE 0.48  CALCIUM 8.6*   GFR: CrCl cannot be calculated (Unknown ideal weight.). Liver Function Tests: Recent Labs   Lab 09/24/20 1840  AST 23  ALT 16  ALKPHOS 78  BILITOT 0.9  PROT 6.3*  ALBUMIN 3.9   No results for input(s): LIPASE, AMYLASE in the last 168 hours. No results for input(s): AMMONIA in the last 168 hours. Coagulation Profile: No results for input(s): INR, PROTIME in the last 168 hours. Cardiac Enzymes: No results for input(s): CKTOTAL, CKMB, CKMBINDEX, TROPONINI in the last 168 hours. BNP (last 3 results) No results for input(s): PROBNP in the last 8760 hours. HbA1C: No results for input(s): HGBA1C in the last 72 hours. CBG: No results for input(s): GLUCAP in the last 168 hours. Lipid Profile: No results for input(s): CHOL, HDL, LDLCALC, TRIG, CHOLHDL, LDLDIRECT in the last 72 hours. Thyroid Function Tests: No results for input(s): TSH, T4TOTAL, FREET4, T3FREE, THYROIDAB in the last 72 hours. Anemia Panel: No results for input(s): VITAMINB12, FOLATE, FERRITIN, TIBC, IRON, RETICCTPCT in the last 72 hours. Urine analysis:    Component Value Date/Time   COLORURINE YELLOW 05/14/2020 1221   APPEARANCEUR CLEAR 05/14/2020 1221   LABSPEC 1.005 05/14/2020 1221   PHURINE 7.0 05/14/2020 1221   GLUCOSEU NEGATIVE 05/14/2020 1221   HGBUR SMALL (A) 05/14/2020 1221   BILIRUBINUR NEGATIVE 05/14/2020 1221   KETONESUR NEGATIVE 05/14/2020 1221   PROTEINUR NEGATIVE 05/14/2020 1221   NITRITE NEGATIVE 05/14/2020 1221   LEUKOCYTESUR NEGATIVE 05/14/2020 1221   Sepsis Labs: @LABRCNTIP (procalcitonin:4,lacticidven:4) ) Recent Results (from the past 240 hour(s))  Resp Panel by RT-PCR (Flu A&B, Covid) Nasopharyngeal Swab     Status: None   Collection Time: 09/24/20  6:40 PM   Specimen: Nasopharyngeal Swab; Nasopharyngeal(NP) swabs in vial transport medium  Result Value Ref Range Status   SARS Coronavirus 2 by RT PCR NEGATIVE NEGATIVE Final    Comment: (NOTE) SARS-CoV-2 target nucleic acids are NOT DETECTED.  The SARS-CoV-2 RNA is generally detectable in upper respiratory specimens during  the acute phase of infection. The lowest concentration of SARS-CoV-2 viral copies this assay can detect is 138 copies/mL. A negative result does not preclude SARS-Cov-2 infection and should not be used as the sole basis for treatment or other patient management decisions. A negative result may occur with  improper specimen collection/handling, submission of specimen other than nasopharyngeal swab, presence of viral mutation(s) within the areas targeted by this assay, and inadequate number of viral copies(<138 copies/mL). A negative result must be combined with clinical observations, patient history, and epidemiological information. The expected result is Negative.  Fact Sheet for Patients:  09/26/20  Fact Sheet for Healthcare Providers:  BloggerCourse.com  This test is no t yet approved or cleared by the [place] FDA and  has been authorized for detection and/or diagnosis of SARS-CoV-2 by FDA under an Emergency Use Authorization (EUA). This EUA will remain  in effect (meaning this test can be used) for the duration of the COVID-19 declaration under Section 564(b)(1) of the Act, 21 U.S.C.section 360bbb-3(b)(1),  unless the authorization is terminated  or revoked sooner.       Influenza A by PCR NEGATIVE NEGATIVE Final   Influenza B by PCR NEGATIVE NEGATIVE Final    Comment: (NOTE) The Xpert Xpress SARS-CoV-2/FLU/RSV plus assay is intended as an aid in the diagnosis of influenza from Nasopharyngeal swab specimens and should not be used as a sole basis for treatment. Nasal washings and aspirates are unacceptable for Xpert Xpress SARS-CoV-2/FLU/RSV testing.  Fact Sheet for Patients: BloggerCourse.com  Fact Sheet for Healthcare Providers: SeriousBroker.it  This test is not yet approved or cleared by the [place] FDA and has been authorized for detection and/or  diagnosis of SARS-CoV-2 by FDA under an Emergency Use Authorization (EUA). This EUA will remain in effect (meaning this test can be used) for the duration of the COVID-19 declaration under Section 564(b)(1) of the Act, 21 U.S.C. section 360bbb-3(b)(1), unless the authorization is terminated or revoked.  Performed at [facility], [address], [place], [place] [address]      Radiological Exams on Admission: DG Hip Unilat With Pelvis 2-3 Views Left  Result Date: 09/24/2020 CLINICAL DATA:  Status post fall. EXAM: DG HIP (WITH OR WITHOUT PELVIS) 2-3V LEFT COMPARISON:  None. FINDINGS: Partially visualized acute displaced fracture of the mid left femoral shaft. Possible femoral neck fracture. There is no evidence of severe arthropathy. Frontal views of the visualized portions of the pelvis and right hip are grossly unremarkable. IMPRESSION: 1. Markedly limited evaluation due to nonconventional views. 2. Partially visualized acute displaced fracture of the mid left femoral shaft. Recommend dedicated x-ray left femur views. 3. A left femoral neck fracture cannot be excluded. Recommend repeat dedicated x-ray left hip views. Electronically Signed   By: Tish Frederickson M.D.   On: 09/24/2020 15:18   DG Femur Min 2 Views Left  Result Date: 09/24/2020 CLINICAL DATA:  Larey Seat earlier today EXAM: LEFT FEMUR 2 VIEWS COMPARISON:  09/24/2020 FINDINGS: Proximal left femur/left femoral head and neck are poorly evaluated due to positioning and limited visibility. There is an acute comminuted fracture involving the distal shaft of the femur with moderate posterior angulation of distal fracture fragments. There is impaction at the fracture site with multiple displaced bone fragments. IMPRESSION: Acute comminuted, impacted and angulated fracture involving the distal shaft of the femur. Left femoral head and neck are poorly evaluated. Electronically Signed   By: Jasmine Pang M.D.   On: 09/24/2020  17:38      Assessment/Plan Principal Problem:   Femur fracture (HCC) Active Problems:   Dementia (HCC)   Femur fracture, left (HCC)    1. Distal left femur fracture likely from mechanical fall.  We will keep patient n.p.o. past midnight transfer patient to [facility] patient's daughter gave the consent for surgery as per the ER physician.  I am unable to reach the patient's daughter.  Orthopedic surgery will be seeing patient in consult. 2. Anemia follow CBC. 3. History of dementia with psychosis.  Has not been taking any of her medications.  Will need to get further history from patient's daughter.  Since patient has left femur fracture with dementia will need close monitoring for any further worsening in inpatient status.  EKG and chest x-rays are pending.   DVT prophylaxis: SCDs for now.  In anticipation of possible surgery will avoid anticoagulation. Code Status: DNR.  This was confirmed by ER physician with daughter and also patient has a golden form. Family Communication: Tried to reach patient's daughter.  Disposition Plan: May need rehab. Consults called: Orthopedic surgery. Admission status: Inpatient.   Eduard Clos MD [facility] Pager [phone].  If 7PM-7AM, please contact night-coverage [email or web address] Password [facility]  09/24/2020, 8:54 PM

## 2020-09-24 NOTE — ED Provider Notes (Signed)
[facility]-EMERGENCY DEPT [facility] Emergency Department Provider Note MRN:  [id number]  Arrival date & time: 09/24/20     Chief Complaint   Fall and Hip Pain   History of Present Illness   Deanna Strong is a 86 y.o. year-old female with a history of dementia, psychosis presenting to the ED with chief complaint of fall.  Ground-level fall from care facility complaining of left hip pain.  Patient is angry because she feels that the caregivers made her fall.  I was unable to obtain an accurate HPI, PMH, or ROS due to the patient's dementia.  Level 5 caveat.  Review of Systems  Positive for fall, hip pain.  Patient's Health History    Past Medical History:  Diagnosis Date  . Anemia   . Dementia (HCC)   . Depression   . Hyperlipidemia   . Hyperthyroidism   . IBS (irritable bowel syndrome)   . Psychosis (HCC) 06/01/2018  . Strabismus     History reviewed. No pertinent surgical history.  No family history on file.  Social History   Socioeconomic History  . Marital status: Unknown    Spouse name: Not on file  . Number of children: Not on file  . Years of education: Not on file  . Highest education level: Not on file  Occupational History  . Not on file  Tobacco Use  . 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 Smokeless tobacco: Never Used  Substance and Sexual Activity  . Alcohol use: Never  . Drug use: Never  . Sexual activity: Not on file  Other Topics Concern  . Not on file  Social History Narrative  . Not on file   Social Determinants of Health   Financial Resource Strain:   . Difficulty of Paying Living Expenses: Not on file  Food Insecurity:   . Worried About Programme researcher, broadcasting/film/video in the Last Year: Not on file  . Ran Out of Food in the Last Year: Not on file  Transportation Needs:   . Lack of Transportation (Medical): Not on file  . Lack of Transportation (Non-Medical): Not on file  Physical Activity:   . Days of Exercise per Week: Not on file  . Minutes of  Exercise per Session: Not on file  Stress:   . Feeling of Stress : Not on file  Social Connections:   . Frequency of Communication with Friends and Family: Not on file  . Frequency of Social Gatherings with Friends and Family: Not on file  . Attends Religious Services: Not on file  . Active Member of Clubs or Organizations: Not on file  . Attends Banker Meetings: Not on file  . Marital Status: Not on file  Intimate Partner Violence:   . Fear of Current or Ex-Partner: Not on file  . Emotionally Abused: Not on file  . Physically Abused: Not on file  . Sexually Abused: Not on file     Physical Exam   Vitals:   09/24/20 1930 09/24/20 1945  BP: (!) 145/117   Pulse: 80 82  Resp: 18 (!) 25  Temp:    SpO2: 100% 100%    CONSTITUTIONAL: Chronically ill-appearing, NAD NEURO:  Alert and oriented x 3, no focal deficits EYES:  eyes equal and reactive ENT/NECK:  no LAD, no JVD CARDIO: Regular rate, well-perfused, normal S1 and S2 PULM:  CTAB no wheezing or rhonchi GI/GU:  normal bowel sounds, non-distended, non-tender MSK/SPINE: Gross deformity to the left femur, neurovascularly intact distally SKIN:  no rash, atraumatic PSYCH:  Appropriate speech and behavior  *Additional and/or pertinent findings included in MDM below  Diagnostic and Interventional Summary    EKG Interpretation  Date/Time:    Ventricular Rate:    PR Interval:    QRS Duration:   QT Interval:    QTC Calculation:   R Axis:     Text Interpretation:        Labs Reviewed  CBC - Abnormal; Notable for the following components:      Result Value   WBC 11.9 (*)    Hemoglobin 11.9 (*)    All other components within normal limits  COMPREHENSIVE METABOLIC PANEL - Abnormal; Notable for the following components:   Glucose, Bld 161 (*)    BUN 7 (*)    Calcium 8.6 (*)    Total Protein 6.3 (*)    All other components within normal limits  RESP PANEL BY RT-PCR (FLU A&B, COVID) ARPGX2    DG Femur Min  2 Views Left  Final Result    DG Hip Unilat With Pelvis 2-3 Views Left  Final Result      Medications - No data to display   Procedures  /  Critical Care Procedures  ED Course and Medical Decision Making  I have reviewed the triage vital signs, the nursing notes, and pertinent available records from the EMR.  Listed above are laboratory and imaging tests that I personally ordered, reviewed, and interpreted and then considered in my medical decision making (see below for details).  Fall, femur fracture.  Spoke with daughter who is power of attorney as patient does not have capacity to make decisions.  Patient continues to endorse wanting to "go home and die".  Daughter wishing to pursue operative management.  Will consult orthopedics and admit to medicine. Clinical Course as of Sep 25 1999  Mon Sep 24, 2020  1644 Dr. Carola Frost of orthopedics will repair tomorrow, requesting admit to [facility] hospitalists    [MB]    Clinical Course User Index [MB] Pilar Plate Elmer Sow, MD     Patient is DNR.  Long discussions with patient's daughter with regard to management options as patient did express these desires to not have surgery.  Discussed the case again with orthopedics, this leg would be forever unstable, would likely never be able to bear weight on it again and she would do well quite a bit of pain if not operatively managed.  Daughter has elected to proceed with surgery.  Elmer Sow. Pilar Plate, MD [facility] Emergency Medicine [facility] [email or web address]  Final Clinical Impressions(s) / ED Diagnoses     ICD-10-CM   1. Closed fracture of left femur, unspecified fracture morphology, unspecified portion of femur, initial encounter (HCC)  S72.92XA     ED Discharge Orders    None       Discharge Instructions Discussed with and Provided to Patient:   Discharge Instructions   None       Sabas Sous, MD 09/24/20 2001

## 2020-09-24 NOTE — ED Triage Notes (Signed)
BIB EMS from [facility] after fall. Pt slipped on floor and fell, no LOC, NO neck or back pain, Left hip pain. 158/86-84-18-98% RA

## 2020-09-25 ENCOUNTER — Inpatient Hospital Stay (HOSPITAL_COMMUNITY): Payer: Medicare Other

## 2020-09-25 ENCOUNTER — Encounter (HOSPITAL_COMMUNITY): Payer: Self-pay | Admitting: Internal Medicine

## 2020-09-25 ENCOUNTER — Inpatient Hospital Stay (HOSPITAL_COMMUNITY): Payer: Medicare Other | Admitting: Certified Registered Nurse Anesthetist

## 2020-09-25 ENCOUNTER — Other Ambulatory Visit: Payer: Self-pay

## 2020-09-25 ENCOUNTER — Encounter (HOSPITAL_COMMUNITY)
Admission: EM | Disposition: A | Discharge status: Skilled Nursing Facility | Payer: Self-pay | Source: Home / Self Care | Attending: Internal Medicine

## 2020-09-25 HISTORY — PX: FEMUR IM NAIL: SHX1597

## 2020-09-25 LAB — CBC
HCT: 30.5 % — ABNORMAL LOW (ref 36.0–46.0)
Hemoglobin: 9.7 g/dL — ABNORMAL LOW (ref 12.0–15.0)
MCH: 26.9 pg (ref 26.0–34.0)
MCHC: 31.8 g/dL (ref 30.0–36.0)
MCV: 84.5 fL (ref 80.0–100.0)
Platelets: 218 10*3/uL (ref 150–400)
RBC: 3.61 MIL/uL — ABNORMAL LOW (ref 3.87–5.11)
RDW: 14.6 % (ref 11.5–15.5)
WBC: 10.4 10*3/uL (ref 4.0–10.5)
nRBC: 0 % (ref 0.0–0.2)

## 2020-09-25 LAB — CREATININE, SERUM
Creatinine, Ser: 0.74 mg/dL (ref 0.44–1.00)
GFR, Estimated: >60 mL/min (ref >60)

## 2020-09-25 LAB — GLUCOSE, CAPILLARY: Glucose-Capillary: 133 mg/dL — ABNORMAL HIGH (ref 70–99)

## 2020-09-25 SURGERY — INSERTION, INTRAMEDULLARY ROD, FEMUR, RETROGRADE
Anesthesia: General | Site: Leg Upper | Laterality: Left

## 2020-09-25 MED ORDER — ACETAMINOPHEN 500 MG PO TABS
500.0000 mg | ORAL_TABLET | Freq: Three times a day (TID) | ORAL | Status: DC
  Administered 2020-09-29: 500 mg via ORAL
  Filled 2020-09-25 (×8): qty 1

## 2020-09-25 MED ORDER — MORPHINE SULFATE (PF) 2 MG/ML IV SOLN: 1.0000 mg | INTRAVENOUS | Status: DC | PRN

## 2020-09-25 MED ORDER — METOCLOPRAMIDE HCL 5 MG PO TABS: 5.0000 mg | ORAL_TABLET | Freq: Three times a day (TID) | ORAL | Status: DC | PRN

## 2020-09-25 MED ORDER — ROCURONIUM BROMIDE 10 MG/ML (PF) SYRINGE
PREFILLED_SYRINGE | INTRAVENOUS | Status: DC | PRN
  Administered 2020-09-25: 60 mg via INTRAVENOUS

## 2020-09-25 MED ORDER — PROPOFOL 10 MG/ML IV BOLUS
INTRAVENOUS | Status: AC
  Filled 2020-09-25: qty 20

## 2020-09-25 MED ORDER — ONDANSETRON HCL 4 MG/2ML IJ SOLN
INTRAMUSCULAR | Status: AC
  Filled 2020-09-25: qty 2

## 2020-09-25 MED ORDER — PROPOFOL 10 MG/ML IV BOLUS
INTRAVENOUS | Status: DC | PRN
  Administered 2020-09-25: 100 mg via INTRAVENOUS
  Administered 2020-09-25: 20 mg via INTRAVENOUS
  Administered 2020-09-25: 30 mg via INTRAVENOUS

## 2020-09-25 MED ORDER — PHENYLEPHRINE 40 MCG/ML (10ML) SYRINGE FOR IV PUSH (FOR BLOOD PRESSURE SUPPORT)
PREFILLED_SYRINGE | INTRAVENOUS | Status: AC
  Filled 2020-09-25: qty 10

## 2020-09-25 MED ORDER — DOCUSATE SODIUM 100 MG PO CAPS
100.0000 mg | ORAL_CAPSULE | Freq: Two times a day (BID) | ORAL | Status: DC
  Administered 2020-09-27 – 2020-09-29 (×2): 100 mg via ORAL
  Filled 2020-09-25 (×5): qty 1

## 2020-09-25 MED ORDER — CHLORHEXIDINE GLUCONATE CLOTH 2 % EX PADS
6.0000 | MEDICATED_PAD | Freq: Every day | CUTANEOUS | Status: DC
  Administered 2020-09-25 – 2020-09-29 (×5): 6 via TOPICAL

## 2020-09-25 MED ORDER — FENTANYL CITRATE (PF) 250 MCG/5ML IJ SOLN
INTRAMUSCULAR | Status: DC | PRN
  Administered 2020-09-25 (×2): 25 ug via INTRAVENOUS
  Administered 2020-09-25: 100 ug via INTRAVENOUS

## 2020-09-25 MED ORDER — CHLORHEXIDINE GLUCONATE 4 % EX LIQD: 60.0000 mL | Freq: Once | CUTANEOUS | Status: DC

## 2020-09-25 MED ORDER — ONDANSETRON HCL 4 MG/2ML IJ SOLN: 4.0000 mg | Freq: Four times a day (QID) | INTRAMUSCULAR | Status: DC | PRN

## 2020-09-25 MED ORDER — LACTATED RINGERS IV SOLN: INTRAVENOUS | Status: DC

## 2020-09-25 MED ORDER — PHENYLEPHRINE HCL-NACL 10-0.9 MG/250ML-% IV SOLN
INTRAVENOUS | Status: DC | PRN
  Administered 2020-09-25: 50 ug/min via INTRAVENOUS

## 2020-09-25 MED ORDER — EPHEDRINE SULFATE-NACL 50-0.9 MG/10ML-% IV SOSY
PREFILLED_SYRINGE | INTRAVENOUS | Status: DC | PRN
  Administered 2020-09-25 (×3): 5 mg via INTRAVENOUS

## 2020-09-25 MED ORDER — PHENOL 1.4 % MT LIQD: 1.0000 | OROMUCOSAL | Status: DC | PRN

## 2020-09-25 MED ORDER — ACETAMINOPHEN 325 MG PO TABS: 650.0000 mg | ORAL_TABLET | Freq: Four times a day (QID) | ORAL | Status: DC | PRN

## 2020-09-25 MED ORDER — ROCURONIUM BROMIDE 10 MG/ML (PF) SYRINGE
PREFILLED_SYRINGE | INTRAVENOUS | Status: AC
  Filled 2020-09-25: qty 10

## 2020-09-25 MED ORDER — MIDAZOLAM HCL 2 MG/2ML IJ SOLN
INTRAMUSCULAR | Status: AC
  Filled 2020-09-25: qty 2

## 2020-09-25 MED ORDER — ACETAMINOPHEN 325 MG PO TABS
325.0000 mg | ORAL_TABLET | Freq: Four times a day (QID) | ORAL | Status: DC | PRN
  Filled 2020-09-25: qty 2

## 2020-09-25 MED ORDER — HYDROCODONE-ACETAMINOPHEN 5-325 MG PO TABS: 1.0000 | ORAL_TABLET | ORAL | Status: DC | PRN

## 2020-09-25 MED ORDER — DEXAMETHASONE SODIUM PHOSPHATE 10 MG/ML IJ SOLN
INTRAMUSCULAR | Status: DC | PRN
  Administered 2020-09-25: 5 mg via INTRAVENOUS

## 2020-09-25 MED ORDER — ENOXAPARIN SODIUM 40 MG/0.4ML ~~LOC~~ SOLN
40.0000 mg | SUBCUTANEOUS | Status: DC
  Filled 2020-09-25: qty 0.4

## 2020-09-25 MED ORDER — ONDANSETRON HCL 4 MG/2ML IJ SOLN
INTRAMUSCULAR | Status: DC | PRN
  Administered 2020-09-25: 4 mg via INTRAVENOUS

## 2020-09-25 MED ORDER — VITAMIN D 25 MCG (1000 UNIT) PO TABS
2000.0000 [IU] | ORAL_TABLET | Freq: Two times a day (BID) | ORAL | Status: DC
  Administered 2020-09-27 – 2020-09-29 (×2): 2000 [IU] via ORAL
  Filled 2020-09-25 (×6): qty 2

## 2020-09-25 MED ORDER — POVIDONE-IODINE 10 % EX SWAB: 2.0000 "application " | Freq: Once | CUTANEOUS | Status: DC

## 2020-09-25 MED ORDER — DEXAMETHASONE SODIUM PHOSPHATE 10 MG/ML IJ SOLN
INTRAMUSCULAR | Status: AC
  Filled 2020-09-25: qty 1

## 2020-09-25 MED ORDER — LIDOCAINE 2% (20 MG/ML) 5 ML SYRINGE
INTRAMUSCULAR | Status: DC | PRN
  Administered 2020-09-25: 40 mg via INTRAVENOUS

## 2020-09-25 MED ORDER — EPHEDRINE 5 MG/ML INJ
INTRAVENOUS | Status: AC
  Filled 2020-09-25: qty 10

## 2020-09-25 MED ORDER — ENSURE ENLIVE PO LIQD
237.0000 mL | Freq: Two times a day (BID) | ORAL | Status: DC
  Filled 2020-09-25: qty 237

## 2020-09-25 MED ORDER — MENTHOL 3 MG MT LOZG: 1.0000 | LOZENGE | OROMUCOSAL | Status: DC | PRN

## 2020-09-25 MED ORDER — CEFAZOLIN SODIUM-DEXTROSE 2-4 GM/100ML-% IV SOLN
2.0000 g | INTRAVENOUS | Status: AC
  Administered 2020-09-25: 2 g via INTRAVENOUS
  Filled 2020-09-25: qty 100

## 2020-09-25 MED ORDER — MORPHINE SULFATE (PF) 2 MG/ML IV SOLN: 0.5000 mg | INTRAVENOUS | Status: DC | PRN

## 2020-09-25 MED ORDER — ADULT MULTIVITAMIN W/MINERALS CH
1.0000 | ORAL_TABLET | Freq: Every day | ORAL | Status: DC
  Administered 2020-09-25 – 2020-09-29 (×2): 1 via ORAL
  Filled 2020-09-25 (×6): qty 1

## 2020-09-25 MED ORDER — PHENYLEPHRINE 40 MCG/ML (10ML) SYRINGE FOR IV PUSH (FOR BLOOD PRESSURE SUPPORT)
PREFILLED_SYRINGE | INTRAVENOUS | Status: DC | PRN
  Administered 2020-09-25 (×5): 80 ug via INTRAVENOUS
  Administered 2020-09-25: 160 ug via INTRAVENOUS

## 2020-09-25 MED ORDER — CEFAZOLIN SODIUM-DEXTROSE 2-4 GM/100ML-% IV SOLN
2.0000 g | Freq: Four times a day (QID) | INTRAVENOUS | Status: AC
  Administered 2020-09-25 – 2020-09-26 (×2): 2 g via INTRAVENOUS
  Filled 2020-09-25 (×3): qty 100

## 2020-09-25 MED ORDER — ONDANSETRON HCL 4 MG PO TABS: 4.0000 mg | ORAL_TABLET | Freq: Four times a day (QID) | ORAL | Status: DC | PRN

## 2020-09-25 MED ORDER — MORPHINE SULFATE (PF) 2 MG/ML IV SOLN: 2.0000 mg | INTRAVENOUS | Status: DC | PRN

## 2020-09-25 MED ORDER — SUGAMMADEX SODIUM 200 MG/2ML IV SOLN
INTRAVENOUS | Status: DC | PRN
  Administered 2020-09-25: 100 mg via INTRAVENOUS

## 2020-09-25 MED ORDER — METOCLOPRAMIDE HCL 5 MG/ML IJ SOLN: 5.0000 mg | Freq: Three times a day (TID) | INTRAMUSCULAR | Status: DC | PRN

## 2020-09-25 MED ORDER — ASCORBIC ACID 500 MG PO TABS
500.0000 mg | ORAL_TABLET | Freq: Every day | ORAL | Status: DC
  Administered 2020-09-29: 500 mg via ORAL
  Filled 2020-09-25 (×5): qty 1

## 2020-09-25 MED ORDER — HALOPERIDOL LACTATE 5 MG/ML IJ SOLN
2.0000 mg | Freq: Four times a day (QID) | INTRAMUSCULAR | Status: DC | PRN
  Administered 2020-09-27: 2 mg via INTRAVENOUS
  Filled 2020-09-25: qty 1
  Filled 2020-09-25: qty 0.4

## 2020-09-25 MED ORDER — CHLORHEXIDINE GLUCONATE 0.12 % MT SOLN
15.0000 mL | Freq: Once | OROMUCOSAL | Status: AC
  Administered 2020-09-25: 15 mL via OROMUCOSAL
  Filled 2020-09-25: qty 15

## 2020-09-25 MED ORDER — FENTANYL CITRATE (PF) 250 MCG/5ML IJ SOLN
INTRAMUSCULAR | Status: AC
  Filled 2020-09-25: qty 5

## 2020-09-25 SURGICAL SUPPLY — 75 items
BANDAGE ESMARK 6X9 LF (GAUZE/BANDAGES/DRESSINGS) IMPLANT
BIT DRILL CALIBRATED 4.3MMX365 (DRILL) IMPLANT
BIT DRILL CROWE PNT TWST 4.5MM (DRILL) IMPLANT
BNDG CMPR 9X6 STRL LF SNTH (GAUZE/BANDAGES/DRESSINGS)
BNDG COHESIVE 4X5 TAN STRL (GAUZE/BANDAGES/DRESSINGS) ×3 IMPLANT
BNDG ELASTIC 4X5.8 VLCR STR LF (GAUZE/BANDAGES/DRESSINGS) ×3 IMPLANT
BNDG ELASTIC 6X5.8 VLCR STR LF (GAUZE/BANDAGES/DRESSINGS) ×3 IMPLANT
BNDG ESMARK 6X9 LF (GAUZE/BANDAGES/DRESSINGS)
BNDG GAUZE ELAST 4 BULKY (GAUZE/BANDAGES/DRESSINGS) ×3 IMPLANT
BRUSH SCRUB EZ PLAIN DRY (MISCELLANEOUS) ×6 IMPLANT
COVER MAYO STAND STRL (DRAPES) ×3 IMPLANT
COVER SURGICAL LIGHT HANDLE (MISCELLANEOUS) ×6 IMPLANT
COVER WAND RF STERILE (DRAPES) ×3 IMPLANT
DRAPE C-ARM 42X72 X-RAY (DRAPES) ×3 IMPLANT
DRAPE C-ARMOR (DRAPES) ×3 IMPLANT
DRAPE HALF SHEET 40X57 (DRAPES) ×4 IMPLANT
DRAPE IMP U-DRAPE 54X76 (DRAPES) ×3 IMPLANT
DRAPE INCISE IOBAN 66X45 STRL (DRAPES) ×3 IMPLANT
DRAPE ORTHO SPLIT 77X108 STRL (DRAPES) ×6
DRAPE SURG ORHT 6 SPLT 77X108 (DRAPES) ×2 IMPLANT
DRAPE U-SHAPE 47X51 STRL (DRAPES) ×3 IMPLANT
DRILL CALIBRATED 4.3MMX365 (DRILL) ×3
DRILL CROWE POINT TWIST 4.5MM (DRILL) ×3
DRSG MEPILEX BORDER 4X4 (GAUZE/BANDAGES/DRESSINGS) ×8 IMPLANT
ELECT REM PT RETURN 9FT ADLT (ELECTROSURGICAL) ×3
ELECTRODE REM PT RTRN 9FT ADLT (ELECTROSURGICAL) ×1 IMPLANT
EVACUATOR 1/8 PVC DRAIN (DRAIN) IMPLANT
GAUZE SPONGE 4X4 12PLY STRL (GAUZE/BANDAGES/DRESSINGS) ×3 IMPLANT
GAUZE XEROFORM 1X8 LF (GAUZE/BANDAGES/DRESSINGS) ×3 IMPLANT
GLOVE BIO SURGEON STRL SZ 6.5 (GLOVE) ×1 IMPLANT
GLOVE BIO SURGEON STRL SZ7.5 (GLOVE) ×3 IMPLANT
GLOVE BIO SURGEON STRL SZ8 (GLOVE) ×3 IMPLANT
GLOVE BIO SURGEONS STRL SZ 6.5 (GLOVE) ×1
GLOVE BIOGEL PI IND STRL 6.5 (GLOVE) IMPLANT
GLOVE BIOGEL PI IND STRL 7.0 (GLOVE) IMPLANT
GLOVE BIOGEL PI IND STRL 7.5 (GLOVE) ×1 IMPLANT
GLOVE BIOGEL PI IND STRL 8 (GLOVE) ×1 IMPLANT
GLOVE BIOGEL PI INDICATOR 6.5 (GLOVE) ×6
GLOVE BIOGEL PI INDICATOR 7.0 (GLOVE) ×2
GLOVE BIOGEL PI INDICATOR 7.5 (GLOVE) ×2
GLOVE BIOGEL PI INDICATOR 8 (GLOVE) ×2
GOWN STRL REUS W/ TWL LRG LVL3 (GOWN DISPOSABLE) ×2 IMPLANT
GOWN STRL REUS W/ TWL XL LVL3 (GOWN DISPOSABLE) ×1 IMPLANT
GOWN STRL REUS W/TWL LRG LVL3 (GOWN DISPOSABLE) ×6
GOWN STRL REUS W/TWL XL LVL3 (GOWN DISPOSABLE) ×3
GUIDEPIN 3.2X17.5 THRD DISP (PIN) ×2 IMPLANT
GUIDEWIRE BEAD TIP (WIRE) ×2 IMPLANT
KIT BASIN OR (CUSTOM PROCEDURE TRAY) ×3 IMPLANT
KIT TURNOVER KIT B (KITS) ×3 IMPLANT
NAIL FEM RETRO 10.5X380 (Nail) ×2 IMPLANT
PACK ORTHO EXTREMITY (CUSTOM PROCEDURE TRAY) ×3 IMPLANT
PACK UNIVERSAL I (CUSTOM PROCEDURE TRAY) ×3 IMPLANT
PAD ARMBOARD 7.5X6 YLW CONV (MISCELLANEOUS) ×6 IMPLANT
SCREW CORT TI DBL LEAD 5X32 (Screw) ×2 IMPLANT
SCREW CORT TI DBL LEAD 5X34 (Screw) ×2 IMPLANT
SCREW CORT TI DBL LEAD 5X48 (Screw) ×2 IMPLANT
SCREW CORT TI DBL LEAD 5X56 (Screw) ×2 IMPLANT
SCREW CORT TI DBL LEAD 5X65 (Screw) ×2 IMPLANT
SCREW CORT TI DBL LEAD 5X70 (Screw) ×2 IMPLANT
SCREW CORT TI DBLE LEAD 5X52 (Screw) ×2 IMPLANT
SPONGE LAP 18X18 RF (DISPOSABLE) ×3 IMPLANT
STAPLER VISISTAT 35W (STAPLE) ×3 IMPLANT
STOCKINETTE IMPERVIOUS LG (DRAPES) ×3 IMPLANT
SUT ETHILON 3 0 PS 1 (SUTURE) ×6 IMPLANT
SUT PROLENE 3 0 PS 2 (SUTURE) IMPLANT
SUT VIC AB 0 CT1 27 (SUTURE)
SUT VIC AB 0 CT1 27XBRD ANBCTR (SUTURE) IMPLANT
SUT VIC AB 2-0 CT1 27 (SUTURE)
SUT VIC AB 2-0 CT1 TAPERPNT 27 (SUTURE) IMPLANT
SUT VIC AB 2-0 CT3 27 (SUTURE) IMPLANT
TOWEL GREEN STERILE (TOWEL DISPOSABLE) ×6 IMPLANT
TOWEL GREEN STERILE FF (TOWEL DISPOSABLE) ×3 IMPLANT
TUBE CONNECTING 12'X1/4 (SUCTIONS) ×1
TUBE CONNECTING 12X1/4 (SUCTIONS) ×2 IMPLANT
YANKAUER SUCT BULB TIP NO VENT (SUCTIONS) ×3 IMPLANT

## 2020-09-25 NOTE — Anesthesia Postprocedure Evaluation (Signed)
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  Procedure(s) Performed: INTRAMEDULLARY (IM) RETROGRADE FEMORAL NAILING (Left Leg Upper)     Patient location during evaluation: PACU Anesthesia Type: General Level of consciousness: awake and alert Pain management: pain level controlled Vital Signs Assessment: post-procedure vital signs reviewed and stable Respiratory status: spontaneous breathing, nonlabored ventilation, respiratory function stable and patient connected to nasal cannula oxygen Cardiovascular status: blood pressure returned to baseline and stable Postop Assessment: no apparent nausea or vomiting Anesthetic complications: no   No complications documented.  Last Vitals:  Vitals:   09/25/20 1345 09/25/20 1415  BP: (!) 120/57   Pulse:  (!) 109  Resp:  15  Temp:    SpO2:  100%    Last Pain:  Vitals:   09/25/20 0723  TempSrc: Oral  PainSc:                  Steel Kerney DAVID

## 2020-09-25 NOTE — Consult Note (Signed)
Reason for Consult:Left femur fx Referring Physician: Leodis Binet Time called: 0902 Time at bedside: 0949   Deanna Strong is an 86 y.o. female.  HPI: Mio was at the facility where she resides when she suffered a fall. She c/o left hip pain and was brought to the ED. X-rays showed a femur fx and orthopedic surgery was consulted. She is demented and cannot really contribute to history.  Past Medical History:  Diagnosis Date  . Anemia   . Dementia (HCC)   . Depression   . Hyperlipidemia   . Hyperthyroidism   . IBS (irritable bowel syndrome)   . Psychosis (HCC) 06/01/2018  . Strabismus     History reviewed. No pertinent surgical history.  Family History  Family history unknown: Yes    Social History:  reports that she has quit smoking. She has never used smokeless tobacco. She reports that she does not drink alcohol and does not use drugs.  Allergies: No Known Allergies  Medications: I have reviewed the patient's current medications.  Results for orders placed or performed during the hospital encounter of 09/24/20 (from the past 48 hour(s))  CBC     Status: Abnormal   Collection Time: 09/24/20  6:40 PM  Result Value Ref Range   WBC 11.9 (H) 4.0 - 10.5 K/uL   RBC 4.45 3.87 - 5.11 MIL/uL   Hemoglobin 11.9 (L) 12.0 - 15.0 g/dL   HCT 50.2 36 - 46 %   MCV 84.5 80.0 - 100.0 fL   MCH 26.7 26.0 - 34.0 pg   MCHC 31.6 30.0 - 36.0 g/dL   RDW 77.4 12.8 - 78.6 %   Platelets 286 150 - 400 K/uL   nRBC 0.0 0.0 - 0.2 %    Comment: Performed at [facility], [address], [place], [place] [address]  Comprehensive metabolic panel     Status: Abnormal   Collection Time: 09/24/20  6:40 PM  Result Value Ref Range   Sodium 135 135 - 145 mmol/L   Potassium 3.6 3.5 - 5.1 mmol/L   Chloride 101 98 - 111 mmol/L   CO2 23 22 - 32 mmol/L   Glucose, Bld 161 (H) 70 - 99 mg/dL    Comment: Glucose reference range applies only to samples taken after fasting for at least 8 hours.    BUN 7 (L) 8 - 23 mg/dL   Creatinine, Ser 9.47 0.44 - 1.00 mg/dL   Calcium 8.6 (L) 8.9 - 10.3 mg/dL   Total Protein 6.3 (L) 6.5 - 8.1 g/dL   Albumin 3.9 3.5 - 5.0 g/dL   AST 23 15 - 41 U/L   ALT 16 0 - 44 U/L   Alkaline Phosphatase 78 38 - 126 U/L   Total Bilirubin 0.9 0.3 - 1.2 mg/dL   GFR, Estimated >09 >62 mL/min    Comment: (NOTE) Calculated using the CKD-EPI Creatinine Equation (2021)    Anion gap 11 5 - 15    Comment: Performed at [facility], [address] [place], [place] [address]  Resp Panel by RT-PCR (Flu A&B, Covid) Nasopharyngeal Swab     Status: None   Collection Time: 09/24/20  6:40 PM   Specimen: Nasopharyngeal Swab; Nasopharyngeal(NP) swabs in vial transport medium  Result Value Ref Range   SARS Coronavirus 2 by RT PCR NEGATIVE NEGATIVE    Comment: (NOTE) SARS-CoV-2 target nucleic acids are NOT DETECTED.  The SARS-CoV-2 RNA is generally detectable in upper respiratory specimens during the acute phase  of infection. The lowest concentration of SARS-CoV-2 viral copies this assay can detect is 138 copies/mL. A negative result does not preclude SARS-Cov-2 infection and should not be used as the sole basis for treatment or other patient management decisions. A negative result may occur with  improper specimen collection/handling, submission of specimen other than nasopharyngeal swab, presence of viral mutation(s) within the areas targeted by this assay, and inadequate number of viral copies(<138 copies/mL). A negative result must be combined with clinical observations, patient history, and epidemiological information. The expected result is Negative.  Fact Sheet for Patients:  BloggerCourse.com  Fact Sheet for Healthcare Providers:  SeriousBroker.it  This test is no t yet approved or cleared by the [place] FDA and  has been authorized for detection and/or diagnosis of SARS-CoV-2 by FDA  under an Emergency Use Authorization (EUA). This EUA will remain  in effect (meaning this test can be used) for the duration of the COVID-19 declaration under Section 564(b)(1) of the Act, 21 U.S.C.section 360bbb-3(b)(1), unless the authorization is terminated  or revoked sooner.       Influenza A by PCR NEGATIVE NEGATIVE   Influenza B by PCR NEGATIVE NEGATIVE    Comment: (NOTE) The Xpert Xpress SARS-CoV-2/FLU/RSV plus assay is intended as an aid in the diagnosis of influenza from Nasopharyngeal swab specimens and should not be used as a sole basis for treatment. Nasal washings and aspirates are unacceptable for Xpert Xpress SARS-CoV-2/FLU/RSV testing.  Fact Sheet for Patients: BloggerCourse.com  Fact Sheet for Healthcare Providers: SeriousBroker.it  This test is not yet approved or cleared by the [place] FDA and has been authorized for detection and/or diagnosis of SARS-CoV-2 by FDA under an Emergency Use Authorization (EUA). This EUA will remain in effect (meaning this test can be used) for the duration of the COVID-19 declaration under Section 564(b)(1) of the Act, 21 U.S.C. section 360bbb-3(b)(1), unless the authorization is terminated or revoked.  Performed at [facility], [address] [place], [place] [address]     DG Hip Unilat With Pelvis 2-3 Views Left  Result Date: 09/24/2020 CLINICAL DATA:  Status post fall. EXAM: DG HIP (WITH OR WITHOUT PELVIS) 2-3V LEFT COMPARISON:  None. FINDINGS: Partially visualized acute displaced fracture of the mid left femoral shaft. Possible femoral neck fracture. There is no evidence of severe arthropathy. Frontal views of the visualized portions of the pelvis and right hip are grossly unremarkable. IMPRESSION: 1. Markedly limited evaluation due to nonconventional views. 2. Partially visualized acute displaced fracture of the mid left femoral shaft. Recommend  dedicated x-ray left femur views. 3. A left femoral neck fracture cannot be excluded. Recommend repeat dedicated x-ray left hip views. Electronically Signed   By: Tish Frederickson M.D.   On: 09/24/2020 15:18   DG Femur Min 2 Views Left  Result Date: 09/24/2020 CLINICAL DATA:  Larey Seat earlier today EXAM: LEFT FEMUR 2 VIEWS COMPARISON:  09/24/2020 FINDINGS: Proximal left femur/left femoral head and neck are poorly evaluated due to positioning and limited visibility. There is an acute comminuted fracture involving the distal shaft of the femur with moderate posterior angulation of distal fracture fragments. There is impaction at the fracture site with multiple displaced bone fragments. IMPRESSION: Acute comminuted, impacted and angulated fracture involving the distal shaft of the femur. Left femoral head and neck are poorly evaluated. Electronically Signed   By: Jasmine Pang M.D.   On: 09/24/2020 17:38    Review of Systems  Unable to perform ROS: Dementia  Blood pressure 125/75, pulse 89, temperature 98.2 F (36.8 C), temperature source Oral, resp. rate 18, height 5\' 2"  (1.575 m), weight 52.3 kg, SpO2 99 %. Physical Exam Constitutional:      General: She is not in acute distress.    Appearance: She is well-developed. She is not diaphoretic.  HENT:     Head: Normocephalic and atraumatic.  Eyes:     General: No scleral icterus.       Right eye: No discharge.        Left eye: No discharge.     Conjunctiva/sclera: Conjunctivae normal.  Cardiovascular:     Rate and Rhythm: Normal rate and regular rhythm.  Pulmonary:     Effort: Pulmonary effort is normal. No respiratory distress.  Musculoskeletal:     Cervical back: Normal range of motion.     Comments: LLE No traumatic wounds, ecchymosis, or rash  Mild TTP  No knee or ankle effusion  Knee stable to varus/ valgus and anterior/posterior stress  Sens DPN, SPN, TN intact  Motor EHL, ext, flex, evers 5/5  DP 1+, PT 0, No significant edema   Skin:    General: Skin is warm and dry.  Neurological:     Mental Status: She is alert.  Psychiatric:        Behavior: Behavior normal.     Assessment/Plan: Left femur fx -- Plan IMN today with Dr. . Please keep NPO. Dementia    Carola Frost, PA-C Orthopedic Surgery [phone] 09/25/2020, 9:57 AM

## 2020-09-25 NOTE — Op Note (Signed)
09/25/2020  1:33 PM  PATIENT:  Deanna Strong  86 y.o. female  PRE-OPERATIVE DIAGNOSIS:  LEFT COMMINUTED FEMORAL SHAFT FRACTURE  POST-OPERATIVE DIAGNOSIS:  LEFT COMMINUTED FEMORAL SHAFT FRACTURE  PROCEDURE:  Procedure(s): INTRAMEDULLARY (IM) RETROGRADE FEMORAL NAILING (Left) with [employer] [place] 10.5 x 380 mm Nail  SURGEON:  Surgeon(s) and Role:    Myrene Galas, MD - Primary  PHYSICIAN ASSISTANT: Montez Morita, PA-C  ANESTHESIA:   general  I/O:  Total I/O In: 700 [I.V.:700] Out: 590 [Urine:550; Blood:40]  SPECIMEN:  No Specimen  TOURNIQUET:  * No tourniquets in log *  COMPLICATIONS: NONE  DICTATION: .Note written in EPIC  DISPOSITION: TO PACU  CONDITION: STABLE  DELAY START OF DVT PROPHYLAXIS BECAUSE OF BLEEDING RISK: NO   BRIEF SUMMARY OF INDICATION FOR PROCEDURE:  Deanna Strong is a  86 y.o. with dementia who sustained comminuted left femur fracture in ground level fall. The risks and benefits of this operation were discussed with the patient including the possibilities of infection, nerve injury, vessel injury, DVT/ PE, loss of motion, arthritis, symptomatic hardware, and need for further surgery among others.  After full discussion, the patient gave consent to proceed.  BRIEF SUMMARY OF PROCEDURE:  After administration of preoperative antibiotics, the patient was taken to the operating room where general anesthesia was induced.  Careful positioning was performed with a bump placed under the left hip, and control of the fracture with distraction during positioning and prepping to prevent neurovascular injury. Standard prep and drape was performed with chlorhexidine scrub and wash initially, then Betadine scrub and paint.  Time-out was held and then the radiolucent triangle and towel bumps used to gain length and sagittal alignment with traction and flexion of the knee.  A 2.5 cm incision was made at the base of the patellar tendon, extending proximally, followed with a  medial parapatellar retinacular incision.  The sharp guide pin was placed directly anterior to Blumensaat's line in the middle of the condyle and advanced on AP and lateral projections into the center-center position of the distal femur. My assistant continued traction while I fine tuned the bumps and he also applied pressure with a mallet to produce coronal plane angulation.  The starting reamer was then used distally while protecting the soft tissues.  This was followed by introduction of the ball-tipped guidewire across the fracture site into the proximal femur up close to the piriformis.  Nail length was measured. Sequential reaming followed while maintaining reduction, encountering no chatter, placing a 10.5 x 380 mm nail.  After confirming appropriate seating of the nail beyond Blumensaat's line on the lateral, locking screws were placed distally off the guide, and checked on orthogonal images to confirm placement within the nail and appropriate length. Two proximal locks were then placed in static mode using perfect circle technique and a captured screwdriver.  These screws were also confirmed for length and position. At the conclusion of the procedure, I examined the knee for varus and valgus stability after fixation and did not identify significant instability. Also with my assistant, Montez Morita, PA-C, we then irrigated all wounds thoroughly and closed them in standard layered fashion, working simultaneously to reduce overall time in the OR. The wounds were dressed and a gently compressive wrap from the ankle to thigh was applied.  The patient was awakened and taken to the PACU in stable condition.    PROGNOSIS:  The patient will have unrestricted range of motion of the knee and hip, and early mobilization will  be encouraged with nonweight bearing. Profound osteoporosis noted intraoperatively. DVT prophylaxis will be with Lovenox. Metabolic bone workup is ongoing. Given the patients associated dementia  and osteoporosis the risk of complications remains elevated which has been discussed with the family.    Doralee Albino. Carola Frost, M.D.

## 2020-09-25 NOTE — Progress Notes (Signed)
Initial Nutrition Assessment  DOCUMENTATION CODES:   Not applicable  INTERVENTION:   -Once diet is advanced, add:   -Ensure Enlive po BID, each supplement provides 350 kcal and 20 grams of protein -MVI with minerals daily  NUTRITION DIAGNOSIS:   Increased nutrient needs related to post-op healing as evidenced by estimated needs.  GOAL:   Patient will meet greater than or equal to 90% of their needs  MONITOR:   PO intake, Supplement acceptance, Labs, Weight trends, Skin, Diet advancement, I & O's  REASON FOR ASSESSMENT:   Consult Assessment of nutrition requirement/status, Hip fracture protocol  ASSESSMENT:   Deanna Strong is a 86 y.o. female with history of dementia with psychosis was brought to the ER after patient had a ground-level fall.  Since then patient was complaining of left thigh pain.  Pt admitted with distal lt femur fracture s/p mechanical fall.   Per orthopedics notes, plan for surgical repair wither today or tomorrow, pending OR availability. Pt currently NPO for pending procedure; previously on a regular diet.   Attempted to speak with pt via call to hospital room phone, however, unable to reach.   Reviewed wt hx; no wt loss over the past 4 months.   Medications reviewed.   Pt with increased nutritional needs for post-operative healing and would greatly benefit from addition of oral nutrition supplements.   Labs reviewed.   Diet Order:   Diet Order            Diet NPO time specified  Diet effective now                 EDUCATION NEEDS:   No education needs have been identified at this time  Skin:  Skin Assessment: Reviewed RN Assessment  Last BM:  09/24/20  Height:   Ht Readings from Last 1 Encounters:  09/25/20 5\' 2"  (1.575 m)    Weight:   Wt Readings from Last 1 Encounters:  09/25/20 52.3 kg    Ideal Body Weight:  50 kg  BMI:  Body mass index is 21.09 kg/m.  Estimated Nutritional Needs:   Kcal:  1400-1600  Protein:   65-80 grams  Fluid:  > 1.4 L    09/27/20, RD, LDN, CDCES Registered Dietitian II Certified Diabetes Care and Education Specialist Please refer to [facility] for RD and/or RD on-call/weekend/after hours pager

## 2020-09-25 NOTE — TOC Initial Note (Signed)
Transition of Care [facility]) - Initial/Assessment Note    Patient Details  Name: Deanna Strong MRN: [id number] Date of Birth: 1933/12/08  Transition of Care [facility]) CM/SW Contact:    Janae Bridgeman, RN Phone Number: 09/25/2020, 3:38 PM  Clinical Narrative:                 Case management called and spoke with the patient's daughter, Deanna Strong who was inquiring about patient's next step after surgery for disposition.  I explained that the patient returned to [facility] from the recovery room today after her femur fracture and she will be waiting on PT/OT evaluation possibly tomorrow.    The patient is currently living at [facility] and after her surgery - many times patient may need SNF placement for PT/OT rehab.  I provided the daughter was SNF resources through Medicare.gov and also a copy was provided in the room for the daughter when she visits tonight with the patient.  The patient is waiting for PT/OT evaluation but TOC will continue to follow the patient for discharge needs.  Expected Discharge Plan: Skilled Nursing Facility Barriers to Discharge: Continued Medical Work up   Patient Goals and CMS Choice Patient states their goals for this hospitalization and ongoing recovery are:: Patient was living at [facility] - waiting on PT for disposition CMS Medicare.gov Compare Post Acute Care list provided to:: Patient Represenative (must comment) (daughter, Deanna Strong) Choice offered to / list presented to : Adult Children  Expected Discharge Plan and Services Expected Discharge Plan: Skilled Nursing Facility   Discharge Planning Services: CM Consult   Living arrangements for the past 2 months: Assisted Living Facility                                      Prior Living Arrangements/Services Living arrangements for the past 2 months: Assisted Living Facility Lives with:: Facility Resident Patient language and need for interpreter reviewed:: Yes Do you feel  safe going back to the place where you live?: Yes      Need for Family Participation in Patient Care: Yes (Comment) Care giver support system in place?: Yes (comment)   Criminal Activity/Legal Involvement Pertinent to Current Situation/Hospitalization: No - Comment as needed  Activities of Daily Living Home Assistive Devices/Equipment: Other (Comment) (pt unable to answer this) ADL Screening (condition at time of admission) Patient's cognitive ability adequate to safely complete daily activities?: No Is the patient deaf or have difficulty hearing?: Yes Does the patient have difficulty seeing, even when wearing glasses/contacts?: No Does the patient have difficulty concentrating, remembering, or making decisions?: Yes (pt keeps talking and unable to be redirected to conversation at hand) Patient able to express need for assistance with ADLs?: Yes Does the patient have difficulty dressing or bathing?: Yes Independently performs ADLs?: No Communication: Independent Dressing (OT): Needs assistance Is this a change from baseline?: Pre-admission baseline Grooming: Independent Feeding: Independent Bathing: Needs assistance Is this a change from baseline?: Pre-admission baseline Toileting: Needs assistance Is this a change from baseline?: Pre-admission baseline In/Out Bed: Needs assistance Is this a change from baseline?: Pre-admission baseline Walks in Home: Needs assistance Is this a change from baseline?: Pre-admission baseline Does the patient have difficulty walking or climbing stairs?: Yes Weakness of Legs: Left Weakness of Arms/Hands: None  Permission Sought/Granted Permission sought to share information with : Case Manager Permission granted to share information with : Yes,  Verbal Permission Granted              Emotional Assessment Appearance:: Appears stated age       Alcohol / Substance Use: Not Applicable    Admission diagnosis:  Femur fracture (HCC)  [S72.90XA] Femur fracture, left (HCC) [S72.92XA] Closed fracture of left femur, unspecified fracture morphology, unspecified portion of femur, initial encounter [facility]) [S72.92XA] Patient Active Problem List   Diagnosis Date Noted  . Femur fracture (HCC) 09/24/2020  . Femur fracture, left (HCC) 09/24/2020  . Dementia (HCC)   . Palliative care encounter 07/30/2018  . Psychosis (HCC) 06/01/2018   PCP:  [name] Pharmacy:   [facility] [place], [place] - [address] [place] [address] Phone: [phone] Fax: [phone]     Social Determinants of Health (SDOH) Interventions    Readmission Risk Interventions Readmission Risk Prevention Plan 09/25/2020  Transportation Screening Complete  PCP or Specialist Appt within 5-7 Days Complete  Home Care Screening Complete  Medication Review (RN CM) Complete  Some recent data might be hidden

## 2020-09-25 NOTE — Progress Notes (Addendum)
Patient has been posted for surgical repair of the left femur.  Full consultation to follow. Hopeful for surgery today though may require delay until tomorrow with Dr. Jena Gauss secondary to scheduling and availability.  Deanna Galas, MD [facility], [facility] [phone]

## 2020-09-25 NOTE — Anesthesia Procedure Notes (Signed)

## 2020-09-25 NOTE — Progress Notes (Signed)
[facility] Progress Note  Patient: Deanna Strong    TGY:[id number]  DOA: 09/24/2020     Date of Service: the patient was seen and examined on 09/25/2020  Brief hospital course: Past medical history of dementia, behavioral disturbances,  Chronic anemia.  Depression.  Presents with complaints of a fall found to have distal left femur fracture. Underwent ORIF on 09/25/2020. Currently plan is monitor postoperative recovery..  Assessment and Plan: 1.  Distal left femur fracture Mechanical fall. SP ORIF intramedullary retrograde femoral nailing. Unrestricted range of motion of knee and hip Profound osteoporosis intraoperatively. DVT prophylaxis with Lovenox..  2.  Chronic anemia. Hemoglobin stable. Expecting postop acute blood loss anemia.  Monitor.  3.  History of dementia Depression Psychosis Monitor for now. Currently so far no issues. Likely at risk for delirium.  4.  Possible osteoporosis Work-up initiated. Monitor.  Diet: Regular diet DVT Prophylaxis:   enoxaparin (LOVENOX) injection 40 mg Start: 09/26/20 0800 SCDs Start: 09/25/20 1503 SCDs Start: 09/24/20 2053    Advance goals of care discussion: DNR  Family Communication: no family was present at bedside, at the time of interview.   Disposition:  Status is: Inpatient  Remains inpatient appropriate because:Ongoing diagnostic testing needed not appropriate for outpatient work up   Dispo: The patient is from: Home              Anticipated d/c is to: SNF              Anticipated d/c date is: 2 days              Patient currently is not medically stable to d/c.  Subjective: No nausea no vomiting.  Pain well controlled.  No fever no chills were seen after surgery.  Somewhat confused.  Physical Exam:  General: Appear in mild distress, no Rash; Oral Mucosa Clear, moist. no Abnormal Neck Mass Or lumps, Conjunctiva normal  Cardiovascular: S1 and S2 Present, no Murmur, Respiratory: good respiratory effort,  Bilateral Air entry present and CTA, no Crackles, no wheezes Abdomen: Bowel Sound present, Soft and no tenderness Extremities: no Pedal edema Neurology: alert and oriented to place and person affect appropriate. no new focal deficit Gait not checked due to patient safety concerns  Vitals:   09/25/20 1445 09/25/20 1500 09/25/20 1600 09/25/20 1700  BP:  121/62 128/70 130/74  Pulse: 95 90 88 90  Resp: 14 18 18 20   Temp: 98.8 F (37.1 C) 97.6 F (36.4 C) 97.6 F (36.4 C) 97.9 F (36.6 C)  TempSrc:  Oral Oral Oral  SpO2: 94% 93%    Weight:      Height:        Intake/Output Summary (Last 24 hours) at 09/25/2020 1944 Last data filed at 09/25/2020 1307 Gross per 24 hour  Intake 700 ml  Output 590 ml  Net 110 ml   Filed Weights   09/25/20 0232 09/25/20 0624  Weight: 52.3 kg 52.3 kg    Data Reviewed: I have personally reviewed and interpreted daily labs, tele strips, imagings as discussed above. I reviewed all nursing notes, pharmacy notes, vitals, pertinent old records I have discussed plan of care as described above with RN and patient/family.  CBC: Recent Labs  Lab 09/24/20 1840 09/25/20 1554  WBC 11.9* 10.4  HGB 11.9* 9.7*  HCT 37.6 30.5*  MCV 84.5 84.5  PLT 286 218   Basic Metabolic Panel: Recent Labs  Lab 09/24/20 1840 09/25/20 1554  NA 135  --   K 3.6  --  CL 101  --   CO2 23  --   GLUCOSE 161*  --   BUN 7*  --   CREATININE 0.48 0.74  CALCIUM 8.6*  --     Studies: DG C-Arm 1-60 Min  Result Date: 09/25/2020 CLINICAL DATA:  ORIF of femur fracture. EXAM: LEFT FEMUR 2 VIEWS; DG C-ARM 1-60 MIN COMPARISON:  Yesterday. FINDINGS: Placement of intramedullary nail with proximal and distal locking screws. Components appear well position. Alignment appears grossly satisfactory. IMPRESSION: Good appearance following intramedullary nail placement. No visible complication. Alignment is grossly satisfactory. Electronically Signed   By: Paulina Fusi M.D.   On:  09/25/2020 13:36   DG FEMUR MIN 2 VIEWS LEFT  Result Date: 09/25/2020 CLINICAL DATA:  ORIF of femur fracture. EXAM: LEFT FEMUR 2 VIEWS; DG C-ARM 1-60 MIN COMPARISON:  Yesterday. FINDINGS: Placement of intramedullary nail with proximal and distal locking screws. Components appear well position. Alignment appears grossly satisfactory. IMPRESSION: Good appearance following intramedullary nail placement. No visible complication. Alignment is grossly satisfactory. Electronically Signed   By: Paulina Fusi M.D.   On: 09/25/2020 13:36   DG FEMUR PORT MIN 2 VIEWS LEFT  Result Date: 09/25/2020 CLINICAL DATA:  Postop fracture. EXAM: LEFT FEMUR PORTABLE 2 VIEWS COMPARISON:  Intraoperative x-ray left femur 09/25/2020, x-ray left femur 09/24/2020 FINDINGS: Status post intramedullary rod with interlocking screw fixation of the left femoral shaft. Improved anatomical alignment and fixation of a comminuted, posteriorly displaced, acute distal left femoral shaft fracture. There is no evidence of a new displaced fracture or aggressive appearing focal bone lesions. Associated subcutaneus soft tissue edema consistent with postsurgical changes. IMPRESSION: Status post open reduction internal fixation of a left distal femoral shaft fracture with intramedullary rod and interlocking screw fixation. Electronically Signed   By: Tish Frederickson M.D.   On: 09/25/2020 16:45    Scheduled Meds:  acetaminophen  500 mg Oral Q8H   vitamin C  500 mg Oral Daily   Chlorhexidine Gluconate Cloth  6 each Topical Daily   cholecalciferol  2,000 Units Oral BID   docusate sodium  100 mg Oral BID   [START ON 09/26/2020] enoxaparin (LOVENOX) injection  40 mg Subcutaneous Q24H   [START ON 09/26/2020] feeding supplement  237 mL Oral BID BM   [START ON 09/26/2020] multivitamin with minerals  1 tablet Oral Daily   Continuous Infusions:   ceFAZolin (ANCEF) IV 2 g (09/25/20 1743)   PRN Meds: [START ON 09/26/2020] acetaminophen,  haloperidol lactate, HYDROcodone-acetaminophen, menthol-cetylpyridinium **OR** phenol, metoCLOPramide **OR** metoCLOPramide (REGLAN) injection, morphine injection, ondansetron **OR** ondansetron (ZOFRAN) IV  Time spent: 35 minutes  Author: Lynden Oxford, MD [facility] 09/25/2020 7:44 PM  To reach On-call, see care teams to locate the attending and reach out via [email or web address]. Between 7PM-7AM, please contact night-coverage If you still have difficulty reaching the attending provider, please page the [facility] (Director on Call) for [facility] on amion for assistance.

## 2020-09-25 NOTE — Plan of Care (Signed)
  Problem: Education: Goal: Knowledge of General Education information will improve Description Including pain rating scale, medication(s)/side effects and non-pharmacologic comfort measures Outcome: Progressing   

## 2020-09-25 NOTE — Transfer of Care (Signed)
Immediate Anesthesia Transfer of Care Note  Patient: Deanna Strong  Procedure(s) Performed: INTRAMEDULLARY (IM) RETROGRADE FEMORAL NAILING (Left Leg Upper)  Patient Location: PACU  Anesthesia Type:General  Level of Consciousness: drowsy  Airway & Oxygen Therapy: Patient Spontanous Breathing and Patient connected to face mask oxygen  Post-op Assessment: Report given to RN and Post -op Vital signs reviewed and stable  Post vital signs: Reviewed and stable  Last Vitals:  Vitals Value Taken Time  BP    Temp    Pulse    Resp    SpO2      Last Pain:  Vitals:   09/25/20 0723  TempSrc: Oral  PainSc:          Complications: No complications documented.

## 2020-09-25 NOTE — ED Notes (Signed)
[facility] here to transport the patient.

## 2020-09-25 NOTE — Anesthesia Preprocedure Evaluation (Signed)
Anesthesia Evaluation  Patient identified by MRN, date of birth, ID band Patient awake    Reviewed: Allergy & Precautions, NPO status , Patient's Chart, lab work & pertinent test results  Airway Mallampati: I  TM Distance: >3 FB Neck ROM: Full    Dental   Pulmonary former smoker,    Pulmonary exam normal        Cardiovascular Normal cardiovascular exam     Neuro/Psych Depression Schizophrenia Dementia    GI/Hepatic   Endo/Other    Renal/GU      Musculoskeletal   Abdominal   Peds  Hematology   Anesthesia Other Findings   Reproductive/Obstetrics                             Anesthesia Physical Anesthesia Plan  ASA: III  Anesthesia Plan: General   Post-op Pain Management:    Induction:   PONV Risk Score and Plan: 3  Airway Management Planned: Oral ETT  Additional Equipment:   Intra-op Plan:   Post-operative Plan: Extubation in OR  Informed Consent: I have reviewed the patients History and Physical, chart, labs and discussed the procedure including the risks, benefits and alternatives for the proposed anesthesia with the patient or authorized representative who has indicated his/her understanding and acceptance.       Plan Discussed with: CRNA and Surgeon  Anesthesia Plan Comments:         Anesthesia Quick Evaluation

## 2020-09-25 NOTE — ED Notes (Signed)
Assumed care of patient at this time, nad noted, sr up x2, bed locked and low, call bell w/I reach.  Will continue to monitor. ° °

## 2020-09-26 ENCOUNTER — Inpatient Hospital Stay (HOSPITAL_COMMUNITY): Payer: Medicare Other

## 2020-09-26 ENCOUNTER — Encounter (HOSPITAL_COMMUNITY): Payer: Self-pay | Admitting: Orthopedic Surgery

## 2020-09-26 DIAGNOSIS — S7292XA Unspecified fracture of left femur, initial encounter for closed fracture: Secondary | ICD-10-CM

## 2020-09-26 DIAGNOSIS — F0150 Vascular dementia without behavioral disturbance: Secondary | ICD-10-CM

## 2020-09-26 LAB — CBC
HCT: 25.1 % — ABNORMAL LOW (ref 36.0–46.0)
HCT: 24.7 % — ABNORMAL LOW (ref 36.0–46.0)
Hemoglobin: 7.9 g/dL — ABNORMAL LOW (ref 12.0–15.0)
Hemoglobin: 7.7 g/dL — ABNORMAL LOW (ref 12.0–15.0)
MCH: 26.2 pg (ref 26.0–34.0)
MCH: 26.5 pg (ref 26.0–34.0)
MCHC: 31.5 g/dL (ref 30.0–36.0)
MCHC: 31.2 g/dL (ref 30.0–36.0)
MCV: 83.4 fL (ref 80.0–100.0)
MCV: 84.9 fL (ref 80.0–100.0)
Platelets: 175 10*3/uL (ref 150–400)
Platelets: 183 10*3/uL (ref 150–400)
RBC: 3.01 MIL/uL — ABNORMAL LOW (ref 3.87–5.11)
RBC: 2.91 MIL/uL — ABNORMAL LOW (ref 3.87–5.11)
RDW: 14.6 % (ref 11.5–15.5)
RDW: 14.7 % (ref 11.5–15.5)
WBC: 9.9 10*3/uL (ref 4.0–10.5)
WBC: 9.2 10*3/uL (ref 4.0–10.5)
nRBC: 0 % (ref 0.0–0.2)
nRBC: 0 % (ref 0.0–0.2)

## 2020-09-26 LAB — VITAMIN D 25 HYDROXY (VIT D DEFICIENCY, FRACTURES): Vit D, 25-Hydroxy: 13.66 ng/mL — ABNORMAL LOW (ref 30–100)

## 2020-09-26 LAB — GLUCOSE, CAPILLARY
Glucose-Capillary: 132 mg/dL — ABNORMAL HIGH (ref 70–99)
Glucose-Capillary: 112 mg/dL — ABNORMAL HIGH (ref 70–99)
Glucose-Capillary: 107 mg/dL — ABNORMAL HIGH (ref 70–99)

## 2020-09-26 LAB — TSH: TSH: 0.285 u[IU]/mL — ABNORMAL LOW (ref 0.350–4.500)

## 2020-09-26 MED ORDER — VITAMIN D (ERGOCALCIFEROL) 1.25 MG (50000 UNIT) PO CAPS
50000.0000 [IU] | ORAL_CAPSULE | ORAL | Status: DC
  Filled 2020-09-26: qty 1

## 2020-09-26 MED ORDER — IOHEXOL 350 MG/ML SOLN: 100.0000 mL | Freq: Once | INTRAVENOUS | Status: DC | PRN

## 2020-09-26 MED ORDER — IOHEXOL 350 MG/ML SOLN
38.0000 mL | Freq: Once | INTRAVENOUS | Status: AC | PRN
  Administered 2020-09-26: 38 mL via INTRAVENOUS

## 2020-09-26 MED ORDER — HEPARIN (PORCINE) 25000 UT/250ML-% IV SOLN
1000.0000 [IU]/h | INTRAVENOUS | Status: DC
  Administered 2020-09-26: 850 [IU]/h via INTRAVENOUS
  Filled 2020-09-26: qty 250

## 2020-09-26 MED ORDER — IOHEXOL 350 MG/ML SOLN
100.0000 mL | Freq: Once | INTRAVENOUS | Status: AC | PRN
  Administered 2020-09-26: 100 mL via INTRAVENOUS

## 2020-09-26 MED ORDER — ASPIRIN EC 81 MG PO TBEC
81.0000 mg | DELAYED_RELEASE_TABLET | Freq: Every day | ORAL | Status: DC
  Administered 2020-09-29: 81 mg via ORAL
  Filled 2020-09-26 (×4): qty 1

## 2020-09-26 NOTE — Evaluation (Signed)
Occupational Therapy Evaluation Patient Details Name: Deanna Strong MRN: [id number] DOB: 1933-12-15 Today's Date: 09/26/2020    History of Present Illness Deanna Strong is a 86 y.o. female with history of dementia with psychosis was brought to the ER after patient had a ground-level fall. X-rays reveal distal L femur fx. Pt underwent IMN of L LE.    Clinical Impression   PTA, pt resides at ALF and typically Independent with ADLs and mobility without AD. PLOF information provided by friend at bedside as pt grossly nonverbal during session. Pt presents with deficits in cognition, strength, endurance, balance, pain and coordination. Pt overall Max A for bed mobility, Mod A x 2 for sit to stand with RW. Pt with decent adherence to NWB precautions but unable to advance to side steps at bedside today. Pt requires Mod A for self feeding (difficulty sequencing appropriate use of items), Max A for UB ADLs and Total A for LB ADLs.   Per friend, pt typically able to converse appropriately and presentation today very different than baseline. Pt noted with dysarthria and expressive difficulties, increased R UE weakness and R lateral lean. RNs notified of symptoms and left attending to pt in room.     Follow Up Recommendations  SNF;Supervision/Assistance - 24 hour    Equipment Recommendations  Other (comment) (to be determined)    Recommendations for Other Services       Precautions / Restrictions Precautions Precautions: Fall Restrictions Weight Bearing Restrictions: Yes Other Position/Activity Restrictions: NWB L LE      Mobility Bed Mobility Overal bed mobility: Needs Assistance Bed Mobility: Supine to Sit;Sit to Supine;Rolling Rolling: Mod assist   Supine to sit: Max assist;+2 for safety/equipment;HOB elevated Sit to supine: Mod assist;+2 for safety/equipment   General bed mobility comments: Able to initiate mobility to EOB, but required Max A x 2 for B LE, scooting hips and advancing  trunk. Able to assist in returning to supine. Mod A to Min A for rolling with difficulty following directions    Transfers Overall transfer level: Needs assistance Equipment used: Rolling walker (2 wheeled) Transfers: Sit to/from Stand Sit to Stand: Mod assist;+2 physical assistance;+2 safety/equipment         General transfer comment: Mod A x 2 for sit to stand transfer using RW with R lateral lean noted, limited weight placed through L LE, but pt unable to advance side steps to [facility]     Balance Overall balance assessment: Needs assistance;History of Falls Sitting-balance support: Feet supported;Bilateral upper extremity supported Sitting balance-Leahy Scale: Poor Sitting balance - Comments: reliant on UE support or min guard for safety Postural control: Right lateral lean Standing balance support: Bilateral upper extremity supported;During functional activity Standing balance-Leahy Scale: Poor Standing balance comment: reliant on UE support and external support                           ADL either performed or assessed with clinical judgement   ADL Overall ADL's : Needs assistance/impaired Eating/Feeding: Moderate assistance;Bed level Eating/Feeding Details (indicate cue type and reason): Grossly Mod A, difficulty sequencing, uses hands to self feed but when cued to use utensils, pt attempts to use fork. Pt noted to place butter packet in mouth. Attempting to put fork in mouth backwards Grooming: Moderate assistance;Bed level   Upper Body Bathing: Moderate assistance;Bed level   Lower Body Bathing: Maximal assistance;Sit to/from stand   Upper Body Dressing : Moderate assistance;Bed level   Lower Body Dressing:  Total assistance;Sit to/from stand;Bed level Lower Body Dressing Details (indicate cue type and reason): Total A to don socks, difficulty sequencing     Toileting- Clothing Manipulation and Hygiene: Total assistance;Bed level         General ADL  Comments: Pt limited by cognition deficits (friend at bedside reports this is not baseline), weakness, NWB precautions     Vision Patient Visual Report: No change from baseline Vision Assessment?: No apparent visual deficits     Perception     Praxis      Pertinent Vitals/Pain Pain Assessment: Faces Faces Pain Scale: Hurts a little bit Pain Location: L LE with rotation Pain Descriptors / Indicators: Grimacing Pain Intervention(s): Monitored during session;Repositioned     Hand Dominance Right   Extremity/Trunk Assessment Upper Extremity Assessment Upper Extremity Assessment: Generalized weakness;RUE deficits/detail RUE Deficits / Details: R hand dominant though pt noted to use L hand to pull R hand across chest. RNs in to assess and pt unable to hold R UE against gravity RUE Coordination: decreased gross motor;decreased fine motor   Lower Extremity Assessment Lower Extremity Assessment: Defer to PT evaluation   Cervical / Trunk Assessment Cervical / Trunk Assessment: Kyphotic   Communication Communication Communication: Expressive difficulties;Other (comment) (grossly nonverbal, dysarthria and expressive difficulties)   Cognition Arousal/Alertness: Awake/alert Behavior During Therapy: Flat affect;Impulsive Overall Cognitive Status: Difficult to assess Area of Impairment: Orientation;Attention;Memory;Following commands;Safety/judgement;Awareness;Problem solving                 Orientation Level: Place;Time;Situation Current Attention Level: Sustained Memory: Decreased recall of precautions;Decreased short-term memory Following Commands: Follows one step commands inconsistently;Follows one step commands with increased time Safety/Judgement: Decreased awareness of deficits;Decreased awareness of safety Awareness: Intellectual Problem Solving: Slow processing;Decreased initiation;Difficulty sequencing;Requires verbal cues;Requires tactile cues General Comments: Pt  grossly nonverbal during session. Pt's friend reports this is not her baseline cognition. Pt overall with difficulty following commands - able to at times with increased time. impulsive, difficulty expressing words at end of session. Difficulty sequencing self feeding tasks   General Comments  Friend entering during session, able to provide PLOF info and reports pt usually conversive, able to answer questions and pretty independent. Today, pt presents with increased confusion, grossly nonverbal with dysarthria and expressive difficulties. Pt also noted with R UE weakness (drops against gravity) and mild facial droop. PT/OT notified RN with RNs in to assess status.     Exercises     Shoulder Instructions      Home Living Family/patient expects to be discharged to:: Assisted living                                 Additional Comments: From [facility]      Prior Functioning/Environment Level of Independence: Needs assistance  Gait / Transfers Assistance Needed: Per friend, pt does not use AD for mobility ADL's / Homemaking Assistance Needed: Per friend, pt was able to complete ADLs without assist, active  Communication / Swallowing Assistance Needed: Friend reports pt lucid 90% of the time, converses well at baseline Comments: PLOF information provided by friend who was present during session as pt grossly nonverbal during session        OT Problem List: Decreased strength;Decreased activity tolerance;Impaired balance (sitting and/or standing);Decreased cognition;Decreased safety awareness;Decreased knowledge of use of DME or AE;Decreased knowledge of precautions;Impaired UE functional use;Pain      OT Treatment/Interventions: Self-care/ADL training;Therapeutic exercise;DME and/or AE instruction;Therapeutic activities;Patient/family  education    OT Goals(Current goals can be found in the care plan section) Acute Rehab OT Goals Patient Stated Goal: pt unable to  state OT Goal Formulation: Patient unable to participate in goal setting Time For Goal Achievement: 10/10/20 Potential to Achieve Goals: Good ADL Goals Pt Will Perform Eating: with modified independence;sitting Pt Will Perform Grooming: with modified independence;sitting Pt Will Perform Upper Body Bathing: with supervision;sitting Pt Will Perform Upper Body Dressing: with supervision;sitting Pt Will Transfer to Toilet: with min assist;stand pivot transfer;bedside commode Additional ADL Goal #1: Pt to demonstrate ability to follow one step commands 75% of the time to improve ADL participation  OT Frequency: Min 2X/week   Barriers to D/C:            Co-evaluation PT/OT/SLP Co-Evaluation/Treatment: Yes Reason for Co-Treatment: Necessary to address cognition/behavior during functional activity;For patient/therapist safety;To address functional/ADL transfers   OT goals addressed during session: ADL's and self-care      AM-PAC OT "6 Clicks" Daily Activity     Outcome Measure Help from another person eating meals?: A Lot Help from another person taking care of personal grooming?: A Lot Help from another person toileting, which includes using toliet, bedpan, or urinal?: Total Help from another person bathing (including washing, rinsing, drying)?: A Lot Help from another person to put on and taking off regular upper body clothing?: A Lot Help from another person to put on and taking off regular lower body clothing?: Total 6 Click Score: 10   End of Session Equipment Utilized During Treatment: Gait belt;Rolling walker Nurse Communication: Mobility status;Other (comment) (CVA symptoms)  Activity Tolerance: Treatment limited secondary to medical complications (Comment) Patient left: in bed;with call bell/phone within reach;with bed alarm set;with family/visitor present;with nursing/sitter in room  OT Visit Diagnosis: Unsteadiness on feet (R26.81);Other abnormalities of gait and mobility  (R26.89);Muscle weakness (generalized) (M62.81);History of falling (Z91.81);Other symptoms and signs involving cognitive function;Pain Pain - Right/Left: Left Pain - part of body: Leg                Time: 4709-6283 OT Time Calculation (min): 31 min Charges:  OT General Charges $OT Visit: 1 Visit OT Evaluation $OT Eval Moderate Complexity: 1 Mod  Lorre Munroe, OTR/L  Lorre Munroe 09/26/2020, 1:11 PM

## 2020-09-26 NOTE — Progress Notes (Signed)
Orthopaedic Trauma Service Progress Note  Patient ID: Deanna Strong MRN: [id number] DOB/AGE: Mar 14, 1934 86 y.o.  Subjective:  Pleasant but refusing nearly all interventions   ROS As above  Objective:   VITALS:   Vitals:   09/25/20 2000 09/25/20 2300 09/26/20 0400 09/26/20 0804  BP: 133/71 138/81 (!) 117/51 121/61  Pulse: 88 90 81 (!) 103  Resp: 16 16 16 16   Temp: 98.3 F (36.8 C) 97.9 F (36.6 C) 97.8 F (36.6 C) 98.2 F (36.8 C)  TempSrc: Axillary Axillary Axillary Axillary  SpO2: 95% 91% 91% 96%  Weight:      Height:        Estimated body mass index is 21.09 kg/m as calculated from the following:   Height as of this encounter: 5\' 2"  (1.575 m).   Weight as of this encounter: 52.3 kg.   Intake/Output      11/23 0701 - 11/24 0700 11/24 0701 - 11/25 0700   P.O. 440 240   I.V. (mL/kg) 700 (13.4)    IV Piggyback 200    Total Intake(mL/kg) 1340 (25.6) 240 (4.6)   Urine (mL/kg/hr) 1250 (1)    Blood 40    Total Output 1290    Net +50 +240          LABS  Results for orders placed or performed during the hospital encounter of 09/24/20 (from the past 24 hour(s))  CBC     Status: Abnormal   Collection Time: 09/25/20  3:54 PM  Result Value Ref Range   WBC 10.4 4.0 - 10.5 K/uL   RBC 3.61 (L) 3.87 - 5.11 MIL/uL   Hemoglobin 9.7 (L) 12.0 - 15.0 g/dL   HCT 09/26/20 (L) 36 - 46 %   MCV 84.5 80.0 - 100.0 fL   MCH 26.9 26.0 - 34.0 pg   MCHC 31.8 30.0 - 36.0 g/dL   RDW 09/27/20 40.9 - 81.1 %   Platelets 218 150 - 400 K/uL   nRBC 0.0 0.0 - 0.2 %  Creatinine, serum     Status: None   Collection Time: 09/25/20  3:54 PM  Result Value Ref Range   Creatinine, Ser 0.74 0.44 - 1.00 mg/dL   GFR, Estimated 78.2 09/27/20 mL/min  Glucose, capillary     Status: Abnormal   Collection Time: 09/25/20 11:35 PM  Result Value Ref Range   Glucose-Capillary 133 (H) 70 - 99 mg/dL  Glucose, capillary     Status: Abnormal    Collection Time: 09/26/20  6:03 AM  Result Value Ref Range   Glucose-Capillary 132 (H) 70 - 99 mg/dL  CBC     Status: Abnormal   Collection Time: 09/26/20  6:12 AM  Result Value Ref Range   WBC 9.9 4.0 - 10.5 K/uL   RBC 3.01 (L) 3.87 - 5.11 MIL/uL   Hemoglobin 7.9 (L) 12.0 - 15.0 g/dL   HCT 09/28/20 (L) 36 - 46 %   MCV 83.4 80.0 - 100.0 fL   MCH 26.2 26.0 - 34.0 pg   MCHC 31.5 30.0 - 36.0 g/dL   RDW 09/28/20 13.0 - 86.5 %   Platelets 175 150 - 400 K/uL   nRBC 0.0 0.0 - 0.2 %     PHYSICAL EXAM:   Gen: sitting up in bed, confused but pleasant  Lungs: unlabored Cardiac: reg  Ext:       Left Lower Extremity   Dressings have been changed, c/d/i  Ext warm   Minimal swelling   Distal motor and sensory functions intact  + DP pulse   No DCT     Assessment/Plan: 1 Day Post-Op   Principal Problem:   Femur fracture (HCC) Active Problems:   Dementia (HCC)   Femur fracture, left (HCC)   Anti-infectives (From admission, onward)   Start     Dose/Rate Route Frequency Ordered Stop   09/25/20 1800  ceFAZolin (ANCEF) IVPB 2g/100 mL premix        2 g 200 mL/hr over 30 Minutes Intravenous Every 6 hours 09/25/20 1502 09/26/20 0224   09/25/20 1015  ceFAZolin (ANCEF) IVPB 2g/100 mL premix        2 g 200 mL/hr over 30 Minutes Intravenous On call to O.R. 09/25/20 1013 09/25/20 1139    .  POD/HD#: 1  86 y/o female, ground level fall, L distal 1/3 femoral shaft fracture   -ground level fall  - L distal 1/3 femoral shaft fracture s/p IMN   NWB L leg  Unrestricted ROM L knee and hip   PT/OT  Ice prn Elevate leg for swelling and pain control  Dressing changes as needed   - Pain management:  Tylenol  Minimize narcotics  - ABL anemia/Hemodynamics  Monitor   CBC in am   - Medical issues   Per primary    - DVT/PE prophylaxis:  lovenox ordered but pt refusing  - ID:   periop abx  - Metabolic Bone Disease:  Vitamin d deficiency    Supplement  Fracture suggestive of  osteoporosis  Fracture liaison consult   - Activity:  As above  - Impediments to fracture healing:  Osteoporosis  Vitamin d deficiency   - Dispo:  Ortho issues stable  Will likely need SNF    TOC consult  Follow up with ortho in 10-14 days    Mearl Latin, PA-C [phone] (C) 09/26/2020, 9:53 AM  [facility] [address] [place] [address] [phone] [phone] (F)    After 5pm and on the weekends please log on to Amion, go to orthopaedics and the look under the [facility] Call for the provider(s) on call. You can also call our office at [phone] and then follow the prompts to be connected to the call team.

## 2020-09-26 NOTE — Evaluation (Addendum)
Physical Therapy Evaluation Patient Details Name: Deanna Strong MRN: [id number] DOB: 12/12/1933 Today's Date: 09/26/2020   History of Present Illness  Deanna Strong is a 86 y.o. female with history of dementia with psychosis was brought to the ER after patient had a ground-level fall. X-rays reveal distal L femur fx. Pt underwent IMN of L LE on 09/25/20.  Clinical Impression   Pt admitted with above diagnosis. Pt with hx of dementia; however, per friend pt is normally conversive, speaks clearly, is mobile without AD, and independent with ADLs.  Today, as expected s/p L femur fx she presented with decreased mobility, strength, balance, ROM, and safety.  However, she also demonstrated word finding difficulty, dysarthria, mild R facial droop, and decreased R UE strength - RN notified and was present at end of session initiating a code stroke.  In regards to L femur fx - pt did unexpectedly well with NWB status with standing and pain seemed controlled. Pt currently with functional limitations due to the deficits listed below (see PT Problem List). Pt will benefit from skilled PT to increase their independence and safety with mobility to allow discharge to the venue listed below.       Follow Up Recommendations SNF    Equipment Recommendations  Wheelchair cushion (measurements PT);Rolling walker with 5" wheels;Wheelchair (measurements PT);3in1 (PT)    Recommendations for Other Services       Precautions / Restrictions Precautions Precautions: Fall Restrictions Weight Bearing Restrictions: Yes LLE Weight Bearing: Non weight bearing Other Position/Activity Restrictions: NWB L LE      Mobility  Bed Mobility Overal bed mobility: Needs Assistance Bed Mobility: Supine to Sit;Sit to Supine;Rolling Rolling: Mod assist   Supine to sit: Max assist;+2 for safety/equipment;HOB elevated Sit to supine: Mod assist;+2 for safety/equipment   General bed mobility comments: Able to initiate mobility to  EOB, but required Max A x 2 for B LE, scooting hips and advancing trunk. Able to assist in returning to supine. Mod A to Min A for rolling with difficulty following directions    Transfers Overall transfer level: Needs assistance Equipment used: Rolling walker (2 wheeled) Transfers: Sit to/from Stand Sit to Stand: Mod assist;+2 physical assistance;+2 safety/equipment         General transfer comment: Performed x 2; Mod A x 2 for sit to stand transfer using RW with R lateral lean noted; PT placed foot under L LE and pt able to maintain TTWB status  Ambulation/Gait             General Gait Details: unable  Stairs            Wheelchair Mobility    Modified Rankin (Stroke Patients Only)       Balance Overall balance assessment: Needs assistance;History of Falls Sitting-balance support: Feet supported;Bilateral upper extremity supported Sitting balance-Leahy Scale: Poor Sitting balance - Comments: reliant on UE support or min guard for safety Postural control: Right lateral lean Standing balance support: Bilateral upper extremity supported;During functional activity Standing balance-Leahy Scale: Poor Standing balance comment: reliant on UE support and external support                             Pertinent Vitals/Pain Pain Assessment: Faces Faces Pain Scale: Hurts a little bit Pain Location: L LE with min rotation to neutral position on pillow Pain Descriptors / Indicators: Grimacing Pain Intervention(s): Limited activity within patient's tolerance;Monitored during session;Repositioned    Home Living Family/patient expects to  be discharged to:: Assisted living                 Additional Comments: From [facility]    Prior Function Level of Independence: Needs assistance   Gait / Transfers Assistance Needed: Per friend, pt does not use AD for mobility and is able to ambulate throughout facility and outside.  ADL's / Homemaking  Assistance Needed: Per friend, pt was able to complete ADLs without assist, active   Comments: PLOF information provided by friend who was present during session as pt grossly nonverbal during session     Hand Dominance   Dominant Hand: Right    Extremity/Trunk Assessment   Upper Extremity Assessment Upper Extremity Assessment: Generalized weakness RUE Deficits / Details: R hand dominant though pt noted to use L hand to pull R hand across chest and eating with L hand. RNs in to assess and pt unable to hold R UE against gravity RUE Coordination: decreased gross motor;decreased fine motor    Lower Extremity Assessment Lower Extremity Assessment: LLE deficits/detail;RLE deficits/detail RLE Deficits / Details: ROM WFL; difficulty following MMT commands but was able to weight bear on leg, perform ankle dorsiflexion at at least 3/5 level, and assisted with knee extension LLE Deficits / Details: ROM: WFL but tends to externally rotate hip and was painful with movement; MMT: unable to follow commands, demonstrating at least 3/5 ankle DF and 1/5 knee and hip    Cervical / Trunk Assessment Cervical / Trunk Assessment: Kyphotic  Communication   Communication: Expressive difficulties;Other (comment) (Pt initially non-verbal but increased throughout session; however, became more dysarthric, "word salad" at times, and difficulty with word finding)  Cognition Arousal/Alertness: Awake/alert Behavior During Therapy: Impulsive Overall Cognitive Status: Difficult to assess Area of Impairment: Orientation;Attention;Memory;Following commands;Safety/judgement;Awareness;Problem solving                 Orientation Level: Place;Time;Situation Current Attention Level: Sustained Memory: Decreased recall of precautions;Decreased short-term memory Following Commands: Follows one step commands inconsistently Safety/Judgement: Decreased awareness of deficits;Decreased awareness of safety Awareness:  Intellectual Problem Solving: Slow processing;Decreased initiation;Difficulty sequencing;Requires verbal cues;Requires tactile cues General Comments: Pt with limited verbalization.. Pt's friend reports this is not her baseline cognition. Pt overall with difficulty following commands - able to at times with increased time. impulsive, difficulty expressing words at end of session. Difficulty sequencing self feeding tasks (using fork backward)      General Comments General comments (skin integrity, edema, etc.): Pt's friend entered during session (when sitting EOB after standing), able to provide PLOF info and reports pt usually conversive, able to answer questions and pretty independent. Today, pt presents with increased confusion, grossly nonverbal with dysarthria and expressive difficulties. Pt also noted with R UE weakness (drops against gravity) and mild facial droop. PT/OT notified RN with RNs in to assess status with code stroke caleld.    Exercises     Assessment/Plan    PT Assessment Patient needs continued PT services  PT Problem List Decreased strength;Decreased mobility;Decreased safety awareness;Decreased range of motion;Decreased coordination;Decreased activity tolerance;Decreased cognition;Decreased balance;Decreased knowledge of use of DME;Pain       PT Treatment Interventions DME instruction;Therapeutic activities;Therapeutic exercise;Patient/family education;Modalities;Balance training;Functional mobility training    PT Goals (Current goals can be found in the Care Plan section)  Acute Rehab PT Goals Patient Stated Goal: pt unable to state PT Goal Formulation: Patient unable to participate in goal setting Time For Goal Achievement: 10/11/20 Potential to Achieve Goals: Good Additional Goals Additional Goal #1: Pt  will adhere to NWB status with transfers    Frequency Min 3X/week   Barriers to discharge        Co-evaluation PT/OT/SLP Co-Evaluation/Treatment:  Yes Reason for Co-Treatment: Complexity of the patient's impairments (multi-system involvement);For patient/therapist safety;Necessary to address cognition/behavior during functional activity PT goals addressed during session: Mobility/safety with mobility;Balance;Proper use of DME OT goals addressed during session: ADL's and self-care       AM-PAC PT "6 Clicks" Mobility  Outcome Measure Help needed turning from your back to your side while in a flat bed without using bedrails?: A Lot Help needed moving from lying on your back to sitting on the side of a flat bed without using bedrails?: Total Help needed moving to and from a bed to a chair (including a wheelchair)?: Total Help needed standing up from a chair using your arms (e.g., wheelchair or bedside chair)?: Total Help needed to walk in hospital room?: Total Help needed climbing 3-5 steps with a railing? : Total 6 Click Score: 7    End of Session Equipment Utilized During Treatment: Gait belt Activity Tolerance: Patient tolerated treatment well Patient left: in bed;with bed alarm set;with call bell/phone within reach;with family/visitor present;with nursing/sitter in room Nurse Communication: Mobility status PT Visit Diagnosis: Unsteadiness on feet (R26.81);Muscle weakness (generalized) (M62.81);History of falling (Z91.81)    Time: 1610-9604 PT Time Calculation (min) (ACUTE ONLY): 31 min   Charges:   PT Evaluation $PT Eval Moderate Complexity: 1 Deanna Strong, PT Acute Rehab Services Pager [phone] [facility] [phone]    Deanna Strong 09/26/2020, 2:37 PM

## 2020-09-26 NOTE — Evaluation (Signed)
Clinical/Bedside Swallow Evaluation Patient Details  Name: Deanna Strong MRN: [id number] Date of Birth: 29-Mar-1934  Today's Date: 09/26/2020 Time: SLP Start Time (ACUTE ONLY): 0934 SLP Stop Time (ACUTE ONLY): 0945 SLP Time Calculation (min) (ACUTE ONLY): 11 min  Past Medical History:  Past Medical History:  Diagnosis Date  . Anemia   . Dementia (HCC)   . Depression   . Hyperlipidemia   . Hyperthyroidism   . IBS (irritable bowel syndrome)   . Psychosis (HCC) 06/01/2018  . Strabismus    Past Surgical History: History reviewed. No pertinent surgical history. HPI:  Pt is an 86 y.o. female with history of dementia with psychosis who was brought to the ED after a ground-level fall and subsequent c/o left thigh pain. Pt found to have distal left femur fracture and underwent ORIF on 09/25/2020.    Assessment / Plan / Recommendation Clinical Impression  Pt was seen for bedside swallow evaluation and she denied a history of dysphagia. Oral mechanism exam was limited due to pt's willingness to participate, but oral motor strength and ROM appeared grossly WFL and dentition was adequate. Pt appeared suspicious of the SLP and the evaluation as evidenced by statements including, but not limited to, "I know what you're up to.", "That makes me think that you're up to something.", and "I know you are trying to trick me." Pt was re-educated regarding the purpose of the evaluation, but her expression of similar statements persisted. Pt accepted thin liquids via cup and refused all subsequent trials. Throat clearing was noted with trials of thin liquids, suggesting possible aspiration. However, pt did not allow further assessment so sample size is limited. It is recommended that the current diet be continued and SLP will follow to perform further assessment of swallow function and determine whether a instrumental assessment is clinically indicated.  SLP Visit Diagnosis: Dysphagia, unspecified (R13.10)     Aspiration Risk  Mild aspiration risk    Diet Recommendation Regular;Thin liquid (Continue current diet until pt allows further assessment)   Liquid Administration via: Straw;Cup Medication Administration: Whole meds with puree Supervision: Staff to assist with self feeding Compensations: Minimize environmental distractions Postural Changes: Seated upright at 90 degrees    Other  Recommendations Oral Care Recommendations: Oral care BID   Follow up Recommendations None      Frequency and Duration min 1 x/week  1 week       Prognosis Prognosis for Safe Diet Advancement: Good Barriers to Reach Goals: Cognitive deficits      Swallow Study   General Date of Onset: 09/25/20 HPI: Pt is an 86 y.o. female with history of dementia with psychosis who was brought to the ED after a ground-level fall and subsequent c/o left thigh pain. Pt found to have distal left femur fracture and underwent ORIF on 09/25/2020.  Type of Study: Bedside Swallow Evaluation Previous Swallow Assessment: None Diet Prior to this Study: Regular;Thin liquids Temperature Spikes Noted: No Respiratory Status: Room air History of Recent Intubation: Yes Length of Intubations (days):  (for surgery) Date extubated: 09/25/20 Behavior/Cognition: Alert;Pleasant mood;Confused Oral Cavity Assessment: Within Functional Limits Oral Care Completed by SLP: Recent completion by staff Oral Cavity - Dentition: Adequate natural dentition Vision: Functional for self-feeding Self-Feeding Abilities: Able to feed self Patient Positioning: Upright in bed;Postural control adequate for testing Baseline Vocal Quality: Normal Volitional Cough: Cognitively unable to elicit Volitional Swallow: Able to elicit    Oral/Motor/Sensory Function Overall Oral Motor/Sensory Function: Within functional limits   Ice Chips Ice chips:  Not tested (Pt refused)   Thin Liquid Thin Liquid: Impaired Presentation: Cup Pharyngeal  Phase Impairments:  Throat Clearing - Immediate    Nectar Thick Nectar Thick Liquid: Not tested   Honey Thick Honey Thick Liquid: Not tested   Puree Puree: Not tested (pt refused )   Solid    Deanna Strong Clock, MS, CCC-SLP Acute Rehabilitation Services Office number [phone] Pager [phone] Solid: Not tested (pt refused )      Deanna Strong 09/26/2020,10:00 AM

## 2020-09-26 NOTE — Progress Notes (Addendum)
0950 Pt is A&O x3, refusing to take all meds. She wants to wash up. Pt is able to wash face, holding wash cloth with both hands. 1215 I was called by PT to pt's room, pt is finding words speech is a little scrambled. Right arm is weaker. Can do grips but cannot keep right arm up. Per pt's friend in the room, this is unusual for the pt. V/S checked, I called RRT. Mitzi Davenport came in and called for code stroke. Dr Laurette Schimke neuro came in. Head CT stat done. Negative for acute stroke. Pt is back in the room, tried to have lunch.  Pt has been refusing oral meds. Dr Jacqulyn Bath notified.

## 2020-09-26 NOTE — Consult Note (Signed)
Neurology Consult   CC: CODE STROKE  History is obtained from: RRRN, floor RN, friend  HPI: Deanna Strong is a 86 y.o. female with a PMHx of dementia with psychosis on occasion, delirium, anemia, depression, HLD, hyperthyroidism, and IBS. Deanna Strong presented in the ED on 11/23 after a fall and was found to have a left distal femur fx. She underwent ORIF of such and was convalescing on 5N. Today, around 1225 a CODE STROKE was called. Per RN, pt was last seen as her normal "fiesty" self at [phone]. PT was working with pt around 12n when they noticed patient was not quite as verbally responsive as per normal. Per friend, she seemed to have right arm weakness and was leaning to the right.   Pt has a known hx of dementia and resides in ALF. Per friend, patient is known to go off on a tangent during conversations, losing focus, and talking about random things. For instance, when NP was asking her orientation questions in CT, she began to tell NP about how important it was to be outside and get fresh air.   She has had no pain medications or Haldol today.   LKW: [id number] tpa given?: No. No stroke on scans.  IR Thrombectomy? No occlusion per scan.   NIHSS: 10 per stroke RN with a 3 subtraction to to left leg weakness but this was side of surgery. By the time the patient was back to the room, there were no focal deficits.   ROS: A complete ROS was performed and is negative except as noted in the HPI.   Past Medical History:  Diagnosis Date  . Anemia   . Dementia (HCC)   . Depression   . Hyperlipidemia   . Hyperthyroidism   . IBS (irritable bowel syndrome)   . Psychosis (HCC) 06/01/2018  . Strabismus      Family History  Family history unknown: Yes    Social History:  reports that she has quit smoking. She has never used smokeless tobacco. She reports that she does not drink alcohol and does not use drugs.   Prior to Admission medications   Medication Sig Start Date End Date Taking?  Authorizing Provider  acetaminophen (TYLENOL) 500 MG tablet Take 500 mg by mouth every 4 (four) hours as needed for mild pain, fever or headache.   Yes [provider]  alum & mag hydroxide-simeth (MINTOX) 200-200-20 MG/5ML suspension Take 30 mLs by mouth every 6 (six) hours as needed for indigestion or heartburn.   Yes [provider]  ciclopirox (LOPROX) 0.77 % cream Apply 1 application topically 2 (two) times daily. Apply to feet for fungal infection   Yes [provider]  guaiFENesin (ROBITUSSIN) 100 MG/5ML SOLN Take 10 mLs by mouth every 6 (six) hours as needed for cough or to loosen phlegm.    Yes [provider]  loperamide (IMODIUM) 2 MG capsule Take 2 mg by mouth daily as needed for diarrhea or loose stools.    Yes [provider]  magnesium hydroxide (MILK OF MAGNESIA) 400 MG/5ML suspension Take 30 mLs by mouth daily as needed for mild constipation.   Yes [provider]  neomycin-bacitracin-polymyxin (NEOSPORIN) OINT Apply 1 application topically daily as needed for wound care.    Yes [provider]  OVER THE COUNTER MEDICATION Take 1 tablet by mouth 2 (two) times daily. Digestive enzyme all natural   Yes [provider]    Exam: Current vital signs: BP 129/67 (BP Location:  Left Arm)   Pulse 99   Temp 98.2 F (36.8 C) (Axillary)   Resp 15   Ht 5\' 2"  (1.575 m) Comment: from [facility] encounter 07/2818  Wt 52.3 kg   SpO2 96%   BMI 21.09 kg/m   Physical Exam  Constitutional: Appears well-developed and well-nourished.  Psych: Affect appropriate to situation Eyes: No scleral injection HENT: No OP obstrucion Head: Normocephalic.  Cardiovascular: Normal rate and regular rhythm.  Respiratory: Effort normal  GI: Soft.  Skin: WDI  Neuro: Mental Status: Patient is awake, alert, oriented to herself. Knows her age and birthday. Knows the President of 08/2818. Does not know the day or date. Knows the month. She  is unable to describe why she is in the hospital. She is unable to desribe what happened surrounding the code stroke.  Patient is unable able to give a clear and coherent history. No signs of aphasia or neglect. Cranial Nerves: II: Visual Fields are full. Pupils are equal, round, and reactive to light. III,IV, VI: EOMI without ptosis or diploplia.  V: Facial sensation is symmetric to light touch.  VII: Facial movement is symmetric.Smile symmetrical.  VIII: hearing is intact to voice X: Uvula elevates symmetrically XI: Shoulder shrug is symmetric. XII: tongue is midline without atrophy or fasciculations.  Motor: Tone is normal. Bulk is normal. Unable to test strength to hip abductors or adductors due to surgery on the left. Grips are 5/5. Will not follow directions for other testing.  Sensory: Sensation is symmetric to light touch in the arms and legs. Deep Tendon Reflexes: 2+ and symmetric in the biceps, triceps and brachioradialis. Patella and tibial 1. . Plantars: Toes are downgoing bilaterally. Cerebellar: FNF is intact but slow to understand exercise. HTS not tested.  Gait: deferred.  Dr. [place] has reviewed the images obtained:CTA NECK FINDINGS  Aortic arch: Standard aortic branching. Atherosclerotic plaque within the visualized aortic arch. No hemodynamically significant innominate or proximal subclavian artery stenosis.  Right carotid system: CCA and ICA patent within the neck without stenosis. Mild soft and calcified plaque within the carotid bulb. Tortuous city of cervical ICA.  Left carotid system: CCA and ICA patent within the neck without stenosis. Mild calcified plaque within the carotid bifurcation and carotid bulb. Tortuosity of the cervical ICA.  Vertebral arteries: Patent within the neck. The left vertebral artery is dominant. There is severe narrowing of the right vertebral artery at the C4-C5 level due to compression from adjacent uncovertebral and facet  hypertrophy (series 5, image 129). There is also mild nonspecific segmental narrowing of the right vertebral artery at the C2-C3 level. No significant stenosis of the left vertebral artery.  Skeleton: No acute bony abnormality or aggressive osseous lesion. Cervical spondylosis. C2-C3 and C7-T1 grade 1 anterolisthesis.  Other neck: 2.2 cm left thyroid lobe nodule with internal foci of calcification. No cervical lymphadenopathy.  Upper chest: No consolidation within the imaged lung apices. There are apparent small filling defects within bilateral upper lobe pulmonary arterial branches suspicious for small pulmonary emboli (series 5, image 1).  Review of the MIP images confirms the above findings  CTA HEAD FINDINGS  Anterior circulation:  The intracranial internal carotid arteries are patent. The M1 middle cerebral arteries are patent. Atherosclerotic irregularity of the M2 and more distal MCA branch vessels (greater on the right). However, no M2 proximal branch occlusion or high-grade proximal stenosis is identified. The anterior cerebral arteries are patent. No intracranial aneurysm is identified.  Posterior circulation:  The intracranial vertebral arteries are  patent. The basilar artery is patent. The posterior cerebral arteries are patent. Posterior communicating arteries are hypoplastic or absent bilaterally.  Venous sinuses: Within the limitations of contrast timing, no convincing thrombus.  Anatomic variants: As described  Review of the MIP images confirms the above findings  CT Brain Perfusion Findings:  ASPECTS: 10  CBF (<30%) Volume: 24mL  Perfusion (Tmax>6.0s) volume: 56mL  Mismatch Volume: 52mL  Infarction Location:None identified  These results were called by telephone at the time of interpretation on 09/26/2020 at 1:50 pm to provider Jamelle Goldston , who verbally acknowledged these results.  IMPRESSION: CTA neck:  1. The  bilateral common and internal carotid arteries are patent within the neck without stenosis. Mild atherosclerotic plaque within both carotid systems. 2. Vertebral arteries patent within the neck. Severe narrowing of the right vertebral artery at the C4-C5 level due to compression from adjacent uncovertebral and facet hypertrophy. Mild nonspecific segmental narrowing of the right vertebral artery at the C2-C3 level. 3. Suspected small bilateral upper lobe pulmonary emboli at the imaged levels. Consider dedicated CT angiogram of the chest for further evaluation.  CTA head:  No intracranial large vessel occlusion or proximal high-grade arterial stenosis.  CT perfusion head:  The perfusion software identifies no core infarct. The perfusion software identifies no region of critically hypoperfused brain parenchyma utilizing the Tmax>6 seconds threshold.   Assessment: Deanna Strong is a 86 y.o. female PMHx with dementia and psychosis, hypothyroidism, HLD, and anemia. A code stroke was called today after 12 noon for right arm weakness and aphasia. LKW 0951am. Pt displayed no aphasia on exam in CT. Right arm weaker than left. Left leg weaker than right, but she just had surgery on the left. By the time the pt returned to her room, her sx were resolved. NP believes that most of her sx can be explained by dementia. We are, however, calling this a TIA.     Impression: 1. Post op TIA.   Plan: - Recommend labs: HbA1c, lipid panel, TSH. - Recommend Statin if LDL > 70 - Aspirin 81mg  daily. -- Telemetry monitoring for arrhythmia. - Recommend bedside Swallow screen. - Recommend Stroke education. - Recommend PT/OT/SLP consult. Bedrest today     Pt seen with Dr. . Note revision per him.  Pearlean Brownie, NP, Neuro Pager: Jimmye Norman 09/26/2020, 1:35 PM  I have personally obtained history,examined this patient, reviewed notes, independently viewed imaging studies, participated in  medical decision making and plan of care.ROS completed by me personally and pertinent positives fully documented  I have made any additions or clarifications directly to the above note. Agree with note above.. She had left femoral # surgery y`day and today had transient speech difficulties and right sided weakness likely TIA and CTA and CTP are unremarkable.Recommend aspirin and check ECHO and lipids, HbA1c CT neck shows suspected pulm emboli hence check CTA chest and if confirmed may need anticoagulation.D/w Dr. 09/28/2020 Greater than 50% time during this 80-minute consultation visit was spent on counseling and coordination of care about TIA and stroke and discussion about pulmonary embolism and anticoagulation and answering questions Lonna Duval, MD Medical Director [facility] Pager: [phone] 09/26/2020 4:14 PM

## 2020-09-26 NOTE — Progress Notes (Signed)
NP called daughter to discuss events for the day and results of the CT scans. Daughter informed of no findings of stroke. Daughter informed of some narrowing of arteries to pt's brain and neck, but no occlusion. Daughter informed we would start ASA 81mg  a day and the stroke team would be following.   Daughter asked NP if pt's vertigo could be worked up in hospital. Daughter states pt has the sense of falling to the right. No syncope. This has been going on for 2 years. Dtr advised to take pt to PCP first to r/p cerumen, fluid, or infection, then possibly to ENT if needed.   , NP General Neuro Team

## 2020-09-26 NOTE — Progress Notes (Signed)
Pt is refusing all her meds, confused. Dr Jacqulyn Bath notified.

## 2020-09-26 NOTE — Progress Notes (Addendum)
PROGRESS NOTE    Deanna Strong  EXN:[id number] DOB: Mar 12, 1934 DOA: 09/24/2020 PCP: [name]   Brief Narrative:  Patient is 86 year old female with past medical history of dementia, behavioral disturbances, chronic anemia, depression presents with complaint of fall found to have distal left femur fracture.  Underwent ORIF on 09/25/2020.  Assessment & Plan:  Distal left femur fracture: Status post fall.  Status post ORIF intramedullary femoral nailing on 11/23 by orthopedic surgery. -Profound osteoporosis Intra-Op operatively noted. -Continue pain medication.  Continue to monitor H&H closely. -PT recommended SNF  Acute right PE: -Reviewed CTA chest -Started patient on full dose heparin monitor H&H closely.  Will get Doppler ultrasound of bilateral lower extremities and transthoracic echo.  TIA: -Code stroke called.  PT was working with patient around 12 noon when they noticed patient was not quite as verbally responsive as per normal, has right arm and left leg weakness. -Neurology consulted. -Underwent CT head, CTA head and neck, CT perfusion study which came back negative for acute stroke. -Appreciated neurology help.   recommended A1c, lipid panel and TSH.  Start aspirin 81 mg daily.  Monitor on telemetry. -Recommend PT/OT/SLP consult   Acute blood loss anemia: H&H dropped from 9.7-7.9 this morning. -Repeat hemoglobin shows 7.7. -Monitor H&H closely and transfuse if hemoglobin is less than 7.  History of dementia/depression/psychosis: -Resides in ALF.  Independent on daily life activities. -cont. Supportive care  Osteoporosis/vitamin D deficiency: -Continue with vitamin D supplements.  DVT prophylaxis: Full dose heparin Code Status: DNR Family Communication:  None present at bedside.  Plan of care discussed with patient in length and he verbalized understanding and agreed with it.  I tried to call patient's daughter Okey Regal twice to give her an update  with no response.  Disposition Plan: SNF  Consultants:   Orthopedic surgery  Neurology  Procedures:   ORIF intramedullary femoral nailing  CT head  CTA head and neck  Antimicrobials:   None  Status is: Inpatient  Dispo: The patient is from: ALF              Anticipated d/c is to: SNF              Anticipated d/c date is: 2 days              Patient currently is not medically stable to d/c.  Subjective: Patient seen and examined.  Sitting comfortably on the bed, alert, incoherent speech.  Not very good historian.  Objective: Vitals:   09/25/20 2300 09/26/20 0400 09/26/20 0804 09/26/20 1231  BP: 138/81 (!) 117/51 121/61 129/67  Pulse: 90 81 (!) 103 99  Resp: 16 16 16 15   Temp: 97.9 F (36.6 C) 97.8 F (36.6 C) 98.2 F (36.8 C)   TempSrc: Axillary Axillary Axillary   SpO2: 91% 91% 96%   Weight:      Height:        Intake/Output Summary (Last 24 hours) at 09/26/2020 1629 Last data filed at 09/26/2020 1300 Gross per 24 hour  Intake 1120 ml  Output 1000 ml  Net 120 ml   Filed Weights   09/25/20 0232 09/25/20 0624  Weight: 52.3 kg 52.3 kg    Examination:  General exam: Appears calm and comfortable, on room air, has incoherent speech Respiratory system: Clear to auscultation. Respiratory effort normal. Cardiovascular system: S1 & S2 heard, RRR. No JVD, murmurs, rubs, gallops or clicks. No pedal edema. Gastrointestinal system: Abdomen is nondistended, soft and nontender. No organomegaly or  masses felt. Normal bowel sounds heard. Central nervous system: Alert and oriented to self only. Skin: No rashes, lesions or ulcers   Data Reviewed: I have personally reviewed following labs and imaging studies  CBC: Recent Labs  Lab 09/24/20 1840 09/25/20 1554 09/26/20 0612 09/26/20 1541  WBC 11.9* 10.4 9.9 9.2  HGB 11.9* 9.7* 7.9* 7.7*  HCT 37.6 30.5* 25.1* 24.7*  MCV 84.5 84.5 83.4 84.9  PLT 286 218 175 183   Basic Metabolic Panel: Recent Labs  Lab  09/24/20 1840 09/25/20 1554  NA 135  --   K 3.6  --   CL 101  --   CO2 23  --   GLUCOSE 161*  --   BUN 7*  --   CREATININE 0.48 0.74  CALCIUM 8.6*  --    GFR: Estimated Creatinine Clearance: 41.4 mL/min (by C-G formula based on SCr of 0.74 mg/dL). Liver Function Tests: Recent Labs  Lab 09/24/20 1840  AST 23  ALT 16  ALKPHOS 78  BILITOT 0.9  PROT 6.3*  ALBUMIN 3.9   No results for input(s): LIPASE, AMYLASE in the last 168 hours. No results for input(s): AMMONIA in the last 168 hours. Coagulation Profile: No results for input(s): INR, PROTIME in the last 168 hours. Cardiac Enzymes: No results for input(s): CKTOTAL, CKMB, CKMBINDEX, TROPONINI in the last 168 hours. BNP (last 3 results) No results for input(s): PROBNP in the last 8760 hours. HbA1C: No results for input(s): HGBA1C in the last 72 hours. CBG: Recent Labs  Lab 09/25/20 2335 09/26/20 0603 09/26/20 1229  GLUCAP 133* 132* 112*   Lipid Profile: No results for input(s): CHOL, HDL, LDLCALC, TRIG, CHOLHDL, LDLDIRECT in the last 72 hours. Thyroid Function Tests: No results for input(s): TSH, T4TOTAL, FREET4, T3FREE, THYROIDAB in the last 72 hours. Anemia Panel: No results for input(s): VITAMINB12, FOLATE, FERRITIN, TIBC, IRON, RETICCTPCT in the last 72 hours. Sepsis Labs: No results for input(s): PROCALCITON, LATICACIDVEN in the last 168 hours.  Recent Results (from the past 240 hour(s))  Resp Panel by RT-PCR (Flu A&B, Covid) Nasopharyngeal Swab     Status: None   Collection Time: 09/24/20  6:40 PM   Specimen: Nasopharyngeal Swab; Nasopharyngeal(NP) swabs in vial transport medium  Result Value Ref Range Status   SARS Coronavirus 2 by RT PCR NEGATIVE NEGATIVE Final    Comment: (NOTE) SARS-CoV-2 target nucleic acids are NOT DETECTED.  The SARS-CoV-2 RNA is generally detectable in upper respiratory specimens during the acute phase of infection. The lowest concentration of SARS-CoV-2 viral copies this assay  can detect is 138 copies/mL. A negative result does not preclude SARS-Cov-2 infection and should not be used as the sole basis for treatment or other patient management decisions. A negative result may occur with  improper specimen collection/handling, submission of specimen other than nasopharyngeal swab, presence of viral mutation(s) within the areas targeted by this assay, and inadequate number of viral copies(<138 copies/mL). A negative result must be combined with clinical observations, patient history, and epidemiological information. The expected result is Negative.  Fact Sheet for Patients:  BloggerCourse.com  Fact Sheet for Healthcare Providers:  SeriousBroker.it  This test is no t yet approved or cleared by the [place] FDA and  has been authorized for detection and/or diagnosis of SARS-CoV-2 by FDA under an Emergency Use Authorization (EUA). This EUA will remain  in effect (meaning this test can be used) for the duration of the COVID-19 declaration under Section 564(b)(1) of the Act, 21 U.S.C.section 360bbb-3(b)(1),  unless the authorization is terminated  or revoked sooner.       Influenza A by PCR NEGATIVE NEGATIVE Final   Influenza B by PCR NEGATIVE NEGATIVE Final    Comment: (NOTE) The Xpert Xpress SARS-CoV-2/FLU/RSV plus assay is intended as an aid in the diagnosis of influenza from Nasopharyngeal swab specimens and should not be used as a sole basis for treatment. Nasal washings and aspirates are unacceptable for Xpert Xpress SARS-CoV-2/FLU/RSV testing.  Fact Sheet for Patients: BloggerCourse.com  Fact Sheet for Healthcare Providers: SeriousBroker.it  This test is not yet approved or cleared by the [place] FDA and has been authorized for detection and/or diagnosis of SARS-CoV-2 by FDA under an Emergency Use Authorization (EUA). This EUA will  remain in effect (meaning this test can be used) for the duration of the COVID-19 declaration under Section 564(b)(1) of the Act, 21 U.S.C. section 360bbb-3(b)(1), unless the authorization is terminated or revoked.  Performed at [facility], [address], [place], [place] [address]       Radiology Studies: CT CEREBRAL PERFUSION W CONTRAST  Result Date: 09/26/2020 CLINICAL DATA:  Stroke suspected. EXAM: CT ANGIOGRAPHY HEAD AND NECK CT PERFUSION BRAIN TECHNIQUE: Multidetector CT imaging of the head and neck was performed using the standard protocol during bolus administration of intravenous contrast. Multiplanar CT image reconstructions and MIPs were obtained to evaluate the vascular anatomy. Carotid stenosis measurements (when applicable) are obtained utilizing NASCET criteria, using the distal internal carotid diameter as the denominator. Multiphase CT imaging of the brain was performed following IV bolus contrast injection. Subsequent parametric perfusion maps were calculated using RAPID software. CONTRAST:  Administered contrast not known at this time. COMPARISON:  One hundred Omnipaque 350 intravenous contrast. FINDINGS: CTA NECK FINDINGS Aortic arch: Standard aortic branching. Atherosclerotic plaque within the visualized aortic arch. No hemodynamically significant innominate or proximal subclavian artery stenosis. Right carotid system: CCA and ICA patent within the neck without stenosis. Mild soft and calcified plaque within the carotid bulb. [place]uous [place] of cervical ICA. Left carotid system: CCA and ICA patent within the neck without stenosis. Mild calcified plaque within the carotid bifurcation and carotid bulb. Tortuosity of the cervical ICA. Vertebral arteries: Patent within the neck. The left vertebral artery is dominant. There is severe narrowing of the right vertebral artery at the C4-C5 level due to compression from adjacent uncovertebral and facet hypertrophy  (series 5, image 129). There is also mild nonspecific segmental narrowing of the right vertebral artery at the C2-C3 level. No significant stenosis of the left vertebral artery. Skeleton: No acute bony abnormality or aggressive osseous lesion. Cervical spondylosis. C2-C3 and C7-T1 grade 1 anterolisthesis. Other neck: 2.2 cm left thyroid lobe nodule with internal foci of calcification. No cervical lymphadenopathy. Upper chest: No consolidation within the imaged lung apices. There are apparent small filling defects within bilateral upper lobe pulmonary arterial branches suspicious for small pulmonary emboli (series 5, image 1). Review of the MIP images confirms the above findings CTA HEAD FINDINGS Anterior circulation: The intracranial internal carotid arteries are patent. The M1 middle cerebral arteries are patent. Atherosclerotic irregularity of the M2 and more distal MCA branch vessels (greater on the right). However, no M2 proximal branch occlusion or high-grade proximal stenosis is identified. The anterior cerebral arteries are patent. No intracranial aneurysm is identified. Posterior circulation: The intracranial vertebral arteries are patent. The basilar artery is patent. The posterior cerebral arteries are patent. Posterior communicating arteries are hypoplastic or absent bilaterally. Venous sinuses: Within the limitations  of contrast timing, no convincing thrombus. Anatomic variants: As described Review of the MIP images confirms the above findings CT Brain Perfusion Findings: ASPECTS: 10 CBF (<30%) Volume: 0mL Perfusion (Tmax>6.0s) volume: 0mL Mismatch Volume: 0mL Infarction Location:None identified These results were called by telephone at the time of interpretation on 09/26/2020 at 1:50 pm to provider PRAMOD SETHI , who verbally acknowledged these results. IMPRESSION: CTA neck: 1. The bilateral common and internal carotid arteries are patent within the neck without stenosis. Mild atherosclerotic plaque  within both carotid systems. 2. Vertebral arteries patent within the neck. Severe narrowing of the right vertebral artery at the C4-C5 level due to compression from adjacent uncovertebral and facet hypertrophy. Mild nonspecific segmental narrowing of the right vertebral artery at the C2-C3 level. 3. Suspected small bilateral upper lobe pulmonary emboli at the imaged levels. Consider dedicated CT angiogram of the chest for further evaluation. CTA head: No intracranial large vessel occlusion or proximal high-grade arterial stenosis. CT perfusion head: The perfusion software identifies no core infarct. The perfusion software identifies no region of critically hypoperfused brain parenchyma utilizing the Tmax>6 seconds threshold. Electronically Signed   By: Jackey Loge DO   On: 09/26/2020 13:53   DG C-Arm 1-60 Min  Result Date: 09/25/2020 CLINICAL DATA:  ORIF of femur fracture. EXAM: LEFT FEMUR 2 VIEWS; DG C-ARM 1-60 MIN COMPARISON:  Yesterday. FINDINGS: Placement of intramedullary nail with proximal and distal locking screws. Components appear well position. Alignment appears grossly satisfactory. IMPRESSION: Good appearance following intramedullary nail placement. No visible complication. Alignment is grossly satisfactory. Electronically Signed   By: Paulina Fusi M.D.   On: 09/25/2020 13:36   DG FEMUR MIN 2 VIEWS LEFT  Result Date: 09/25/2020 CLINICAL DATA:  ORIF of femur fracture. EXAM: LEFT FEMUR 2 VIEWS; DG C-ARM 1-60 MIN COMPARISON:  Yesterday. FINDINGS: Placement of intramedullary nail with proximal and distal locking screws. Components appear well position. Alignment appears grossly satisfactory. IMPRESSION: Good appearance following intramedullary nail placement. No visible complication. Alignment is grossly satisfactory. Electronically Signed   By: Paulina Fusi M.D.   On: 09/25/2020 13:36   DG Femur Min 2 Views Left  Result Date: 09/24/2020 CLINICAL DATA:  Larey Seat earlier today EXAM: LEFT FEMUR 2  VIEWS COMPARISON:  09/24/2020 FINDINGS: Proximal left femur/left femoral head and neck are poorly evaluated due to positioning and limited visibility. There is an acute comminuted fracture involving the distal shaft of the femur with moderate posterior angulation of distal fracture fragments. There is impaction at the fracture site with multiple displaced bone fragments. IMPRESSION: Acute comminuted, impacted and angulated fracture involving the distal shaft of the femur. Left femoral head and neck are poorly evaluated. Electronically Signed   By: Jasmine Pang M.D.   On: 09/24/2020 17:38   DG FEMUR PORT MIN 2 VIEWS LEFT  Result Date: 09/25/2020 CLINICAL DATA:  Postop fracture. EXAM: LEFT FEMUR PORTABLE 2 VIEWS COMPARISON:  Intraoperative x-ray left femur 09/25/2020, x-ray left femur 09/24/2020 FINDINGS: Status post intramedullary rod with interlocking screw fixation of the left femoral shaft. Improved anatomical alignment and fixation of a comminuted, posteriorly displaced, acute distal left femoral shaft fracture. There is no evidence of a new displaced fracture or aggressive appearing focal bone lesions. Associated subcutaneus soft tissue edema consistent with postsurgical changes. IMPRESSION: Status post open reduction internal fixation of a left distal femoral shaft fracture with intramedullary rod and interlocking screw fixation. Electronically Signed   By: Tish Frederickson M.D.   On: 09/25/2020 16:45   CT HEAD  CODE STROKE WO CONTRAST  Result Date: 09/26/2020 CLINICAL DATA:  Code stroke. Neuro deficit, acute, stroke suspected. EXAM: CT HEAD WITHOUT CONTRAST TECHNIQUE: Contiguous axial images were obtained from the base of the skull through the vertex without intravenous contrast. COMPARISON:  Head CT 06/01/2018. FINDINGS: Brain: Mild generalized cerebral atrophy. Moderately advanced ill-defined hypoattenuation within the cerebral white matter is nonspecific, but compatible with chronic small vessel  ischemic disease. There is no acute intracranial hemorrhage. No demarcated cortical infarct. No extra-axial fluid collection. No evidence of intracranial mass. No midline shift. Vascular: No hyperdense vessel. Skull: Atherosclerotic calcifications Sinuses/Orbits: Visualized orbits show no acute finding. No significant paranasal sinus disease at the imaged levels. ASPECTS [facility] Stroke Program Early CT Score) - Ganglionic level infarction (caudate, lentiform nuclei, internal capsule, insula, M1-M3 cortex):7 - Supraganglionic infarction (M4-M6 cortex): 3 Total score (0-10 with 10 being normal): 10 These results were communicated to Dr. Pearlean Brownie At 1:08 pmon 11/24/2021by text page via the [facility] messaging system. IMPRESSION: No evidence of acute intracranial abnormality. Mild cerebral atrophy and moderately advanced cerebral white matter chronic small vessel ischemic disease, progressed as compared to the head CT of 06/01/2018. Mild cerebral atrophy. Electronically Signed   By: Jackey Loge DO   On: 09/26/2020 13:14   CT ANGIO NECK CODE STROKE  Result Date: 09/26/2020 CLINICAL DATA:  Stroke suspected. EXAM: CT ANGIOGRAPHY HEAD AND NECK CT PERFUSION BRAIN TECHNIQUE: Multidetector CT imaging of the head and neck was performed using the standard protocol during bolus administration of intravenous contrast. Multiplanar CT image reconstructions and MIPs were obtained to evaluate the vascular anatomy. Carotid stenosis measurements (when applicable) are obtained utilizing NASCET criteria, using the distal internal carotid diameter as the denominator. Multiphase CT imaging of the brain was performed following IV bolus contrast injection. Subsequent parametric perfusion maps were calculated using RAPID software. CONTRAST:  Administered contrast not known at this time. COMPARISON:  One hundred Omnipaque 350 intravenous contrast. FINDINGS: CTA NECK FINDINGS Aortic arch: Standard aortic branching. Atherosclerotic plaque within  the visualized aortic arch. No hemodynamically significant innominate or proximal subclavian artery stenosis. Right carotid system: CCA and ICA patent within the neck without stenosis. Mild soft and calcified plaque within the carotid bulb. [place] of cervical ICA. Left carotid system: CCA and ICA patent within the neck without stenosis. Mild calcified plaque within the carotid bifurcation and carotid bulb. Tortuosity of the cervical ICA. Vertebral arteries: Patent within the neck. The left vertebral artery is dominant. There is severe narrowing of the right vertebral artery at the C4-C5 level due to compression from adjacent uncovertebral and facet hypertrophy (series 5, image 129). There is also mild nonspecific segmental narrowing of the right vertebral artery at the C2-C3 level. No significant stenosis of the left vertebral artery. Skeleton: No acute bony abnormality or aggressive osseous lesion. Cervical spondylosis. C2-C3 and C7-T1 grade 1 anterolisthesis. Other neck: 2.2 cm left thyroid lobe nodule with internal foci of calcification. No cervical lymphadenopathy. Upper chest: No consolidation within the imaged lung apices. There are apparent small filling defects within bilateral upper lobe pulmonary arterial branches suspicious for small pulmonary emboli (series 5, image 1). Review of the MIP images confirms the above findings CTA HEAD FINDINGS Anterior circulation: The intracranial internal carotid arteries are patent. The M1 middle cerebral arteries are patent. Atherosclerotic irregularity of the M2 and more distal MCA branch vessels (greater on the right). However, no M2 proximal branch occlusion or high-grade proximal stenosis is identified. The anterior cerebral arteries are patent. No intracranial  aneurysm is identified. Posterior circulation: The intracranial vertebral arteries are patent. The basilar artery is patent. The posterior cerebral arteries are patent. Posterior communicating arteries  are hypoplastic or absent bilaterally. Venous sinuses: Within the limitations of contrast timing, no convincing thrombus. Anatomic variants: As described Review of the MIP images confirms the above findings CT Brain Perfusion Findings: ASPECTS: 10 CBF (<30%) Volume: 0mL Perfusion (Tmax>6.0s) volume: 0mL Mismatch Volume: 0mL Infarction Location:None identified These results were called by telephone at the time of interpretation on 09/26/2020 at 1:50 pm to provider PRAMOD SETHI , who verbally acknowledged these results. IMPRESSION: CTA neck: 1. The bilateral common and internal carotid arteries are patent within the neck without stenosis. Mild atherosclerotic plaque within both carotid systems. 2. Vertebral arteries patent within the neck. Severe narrowing of the right vertebral artery at the C4-C5 level due to compression from adjacent uncovertebral and facet hypertrophy. Mild nonspecific segmental narrowing of the right vertebral artery at the C2-C3 level. 3. Suspected small bilateral upper lobe pulmonary emboli at the imaged levels. Consider dedicated CT angiogram of the chest for further evaluation. CTA head: No intracranial large vessel occlusion or proximal high-grade arterial stenosis. CT perfusion head: The perfusion software identifies no core infarct. The perfusion software identifies no region of critically hypoperfused brain parenchyma utilizing the Tmax>6 seconds threshold. Electronically Signed   By: Jackey LogeKyle  Golden DO   On: 09/26/2020 13:53    Scheduled Meds: . acetaminophen  500 mg Oral Q8H  . vitamin C  500 mg Oral Daily  . aspirin EC  81 mg Oral Daily  . Chlorhexidine Gluconate Cloth  6 each Topical Daily  . cholecalciferol  2,000 Units Oral BID  . docusate sodium  100 mg Oral BID  . multivitamin with minerals  1 tablet Oral Daily   Continuous Infusions: . heparin       LOS: 2 days   Time spent: 45 minutes  Marvelle Span Estill Cotta Zaniel Marineau, MD [facility]  If 7PM-7AM, please contact  night-coverage [email or web address] 09/26/2020, 4:29 PM

## 2020-09-26 NOTE — Code Documentation (Signed)
Stroke Response Nurse Documentation Code Documentation  Deanna Strong is a 86 y.o. female admitted to [facility] on 09/24/2020 with complaints of left thigh pain following a mechanical fall. She was found to have a distal left femur fracture and underwent a left ORIF with IMN on 09/25/2020. Her past medical hx consists of dementia. Code stroke was activated by primary RN at 1238. She was LKW at 1000 and now complaining of word finding and change in speech. On Lovenox 40mg , although she refused this medication this morning. Patient to CT with team. NIHSS 10, see documentation for details and code stroke times. Patient with right arm weakness, right decreased sensation, Global aphasia  and Sensory  neglect on exam. The following imaging was completed: CT, CTA head and neck, CTP. Patient is not a candidate for tPA due to recent surgical intervention.  Plan:  Q2H NIHSS and vital signs Bedside handoff with , RN  Durwin Nora Randeep Biondolillo  Rapid Response RN

## 2020-09-26 NOTE — Progress Notes (Signed)
I was able to administer IV antibiotic after patient went to sleep.

## 2020-09-26 NOTE — Progress Notes (Signed)
Patient refusing all medications including IV antibiotics,. Doctor notified

## 2020-09-26 NOTE — Progress Notes (Signed)
Nutrition Follow-up  DOCUMENTATION CODES:   Not applicable  INTERVENTION:   -D/c Ensure Enlive po BID, each supplement provides 350 kcal and 20 grams of protein -Continue MVI with minerals daily -Magic cup BID with meals, each supplement provides 290 kcal and 9 grams of protein  NUTRITION DIAGNOSIS:   Increased nutrient needs related to post-op healing as evidenced by estimated needs.  Ongoing  GOAL:   Patient will meet greater than or equal to 90% of their needs  Progressing   MONITOR:   PO intake, Supplement acceptance, Labs, Weight trends, Skin, Diet advancement, I & O's  REASON FOR ASSESSMENT:   Consult Assessment of nutrition requirement/status, Hip fracture protocol  ASSESSMENT:   Deanna Strong is a 86 y.o. female with history of dementia with psychosis was brought to the ER after patient had a ground-level fall.  Since then patient was complaining of left thigh pain.  11/23- s/p Procedure(s): INTRAMEDULLARY (IM) RETROGRADE FEMORAL NAILING (Left) with [employer] [place] 10.5 x 380 mm Nail 11/24- s/p BSE- recommending regular diet with thin liquids  Reviewed I/O's: +50 ml x 24 hours  UOP: 1.3 L x 24 hours  Pt unavailable at time of visit; currently working with therapy.   Pt with good appetite; noted meal completion 75-100%. She is refusing Ensure supplements.   Medications reviewed and include vitamin C, vitamin D3, and colace.   Per therapy notes, recommending SNF placement at discharge.  Labs reviewed: CBGS: 112.   Diet Order:   Diet Order            Diet regular Room service appropriate? Yes; Fluid consistency: Thin  Diet effective now                 EDUCATION NEEDS:   No education needs have been identified at this time  Skin:  Skin Assessment: Skin Integrity Issues: Skin Integrity Issues:: Incisions Incisions: closed lt knee, lt thigh  Last BM:  09/25/20  Height:   Ht Readings from Last 1 Encounters:  09/25/20 5\' 2"  (1.575 m)     Weight:   Wt Readings from Last 1 Encounters:  09/25/20 52.3 kg    Ideal Body Weight:  50 kg  BMI:  Body mass index is 21.09 kg/m.  Estimated Nutritional Needs:   Kcal:  1400-1600  Protein:  65-80 grams  Fluid:  > 1.4 L    09/27/20, RD, LDN, CDCES Registered Dietitian II Certified Diabetes Care and Education Specialist Please refer to [facility] for RD and/or RD on-call/weekend/after hours pager

## 2020-09-26 NOTE — Progress Notes (Signed)
ANTICOAGULATION CONSULT NOTE  Pharmacy Consult for Lovenox Indication: r/o PE   Heparin dosing weight - 52.3 kg  Labs: Recent Labs    09/24/20 1840 09/24/20 1840 09/25/20 1554 09/26/20 0612  HGB 11.9*   < > 9.7* 7.9*  HCT 37.6  --  30.5* 25.1*  PLT 286  --  218 175  CREATININE 0.48  --  0.74  --    < > = values in this interval not displayed.    Assessment: 56 yof presenting s/p ground level fall, L distal femoral shaft fracture, s/p repair 11/23. Now PE suspected and CTA ordered. Pharmacy consulted to dose heparin infusion for r/o PE. Patient previously ordered Lovenox prophylaxis post-op but refused - no doses given. Noted Hg drop post-op, 11.9>>7.9 (Dr. Jacqulyn Bath aware, ordering repeat CBC), plt down to 175. No current active bleed issues reported.  Goal of Therapy:  Heparin level 0.3-0.7 units/ml Monitor platelets by anticoagulation protocol: Yes   Plan:  No bolus with recent surgery. Start heparin 850 units/hr - likely should aim lower end for now Check 8hr heparin level Monitor daily heparin level and CBC, s/sx bleeding   Babs Bertin, PharmD, BCPS Clinical Pharmacist 09/26/2020 2:38 PM

## 2020-09-27 ENCOUNTER — Inpatient Hospital Stay (HOSPITAL_COMMUNITY): Payer: Medicare Other

## 2020-09-27 DIAGNOSIS — G459 Transient cerebral ischemic attack, unspecified: Secondary | ICD-10-CM

## 2020-09-27 LAB — BASIC METABOLIC PANEL
Anion gap: 9 (ref 5–15)
BUN: 9 mg/dL (ref 8–23)
CO2: 23 mmol/L (ref 22–32)
Calcium: 7.9 mg/dL — ABNORMAL LOW (ref 8.9–10.3)
Chloride: 102 mmol/L (ref 98–111)
Creatinine, Ser: 0.57 mg/dL (ref 0.44–1.00)
GFR, Estimated: >60 mL/min (ref >60)
Glucose, Bld: 130 mg/dL — ABNORMAL HIGH (ref 70–99)
Potassium: 3.8 mmol/L (ref 3.5–5.1)
Sodium: 134 mmol/L — ABNORMAL LOW (ref 135–145)

## 2020-09-27 LAB — HEPARIN LEVEL (UNFRACTIONATED)
Heparin Unfractionated: 0.14 IU/mL — ABNORMAL LOW (ref 0.30–0.70)
Heparin Unfractionated: 0.23 IU/mL — ABNORMAL LOW (ref 0.30–0.70)
Heparin Unfractionated: 0.24 IU/mL — ABNORMAL LOW (ref 0.30–0.70)

## 2020-09-27 LAB — CBC
HCT: 22.8 % — ABNORMAL LOW (ref 36.0–46.0)
HCT: 23.4 % — ABNORMAL LOW (ref 36.0–46.0)
Hemoglobin: 7 g/dL — ABNORMAL LOW (ref 12.0–15.0)
Hemoglobin: 7.3 g/dL — ABNORMAL LOW (ref 12.0–15.0)
MCH: 26 pg (ref 26.0–34.0)
MCH: 26.7 pg (ref 26.0–34.0)
MCHC: 30.7 g/dL (ref 30.0–36.0)
MCHC: 31.2 g/dL (ref 30.0–36.0)
MCV: 84.8 fL (ref 80.0–100.0)
MCV: 85.7 fL (ref 80.0–100.0)
Platelets: 146 10*3/uL — ABNORMAL LOW (ref 150–400)
Platelets: 163 10*3/uL (ref 150–400)
RBC: 2.69 MIL/uL — ABNORMAL LOW (ref 3.87–5.11)
RBC: 2.73 MIL/uL — ABNORMAL LOW (ref 3.87–5.11)
RDW: 14.7 % (ref 11.5–15.5)
RDW: 15 % (ref 11.5–15.5)
WBC: 6.5 10*3/uL (ref 4.0–10.5)
WBC: 6.6 10*3/uL (ref 4.0–10.5)
nRBC: 0 % (ref 0.0–0.2)
nRBC: 0.3 % — ABNORMAL HIGH (ref 0.0–0.2)

## 2020-09-27 LAB — LIPID PANEL
Cholesterol: 162 mg/dL (ref 0–200)
HDL: 52 mg/dL (ref >40)
LDL Cholesterol: 95 mg/dL (ref 0–99)
Total CHOL/HDL Ratio: 3.1 RATIO
Triglycerides: 73 mg/dL (ref <150)
VLDL: 15 mg/dL (ref 0–40)

## 2020-09-27 LAB — HEMOGLOBIN A1C
Hgb A1c MFr Bld: 5.4 % (ref 4.8–5.6)
Mean Plasma Glucose: 108.28 mg/dL

## 2020-09-27 LAB — GLUCOSE, CAPILLARY: Glucose-Capillary: 109 mg/dL — ABNORMAL HIGH (ref 70–99)

## 2020-09-27 LAB — PREPARE RBC (CROSSMATCH)

## 2020-09-27 MED ORDER — HEPARIN (PORCINE) 25000 UT/250ML-% IV SOLN
1250.0000 [IU]/h | INTRAVENOUS | Status: AC
  Administered 2020-09-27: 1100 [IU]/h via INTRAVENOUS
  Filled 2020-09-27: qty 250

## 2020-09-27 MED ORDER — ACETAMINOPHEN 325 MG PO TABS: 650.0000 mg | ORAL_TABLET | Freq: Once | ORAL | Status: DC

## 2020-09-27 MED ORDER — SODIUM CHLORIDE 0.9% IV SOLUTION: Freq: Once | INTRAVENOUS | Status: DC

## 2020-09-27 MED ORDER — ATORVASTATIN CALCIUM 40 MG PO TABS
40.0000 mg | ORAL_TABLET | Freq: Every day | ORAL | Status: DC
  Administered 2020-09-29: 40 mg via ORAL
  Filled 2020-09-27 (×3): qty 1

## 2020-09-27 NOTE — Progress Notes (Signed)
STROKE TEAM PROGRESS NOTE   INTERVAL HISTORY Patient is lying comfortably in bed.  She appears pleasantly demented and has easy distractibility and her speech seems tangential at times.  She states she still has some right upper extremity weakness but she blames this on mechanical pain from her right shoulder.  She has no new complaints.  Vital signs are stable.  CT angiogram of the chest confirmed small pulmonary emboli in the right upper lobe.  She has been started on IV heparin.  Echocardiogram and lower extremity venous Dopplers are pending.  LDL cholesterol is 95 mg percent and hemoglobin A1c is 5.4.  Vitals:   09/26/20 1952 09/26/20 2354 09/27/20 0350 09/27/20 0819  BP: (!) 103/57 106/67 117/66 119/70  Pulse: 89 85 84 82  Resp: 16 18 16 18   Temp: 98.6 F (37 C) 98.6 F (37 C) 98.8 F (37.1 C) 98.6 F (37 C)  TempSrc: Axillary Oral Axillary Oral  SpO2: 94% 96% 90% 97%  Weight:      Height:       CBC:  Recent Labs  Lab 09/26/20 1541 09/27/20 0021  WBC 9.2 6.6  HGB 7.7* 7.0*  HCT 24.7* 22.8*  MCV 84.9 84.8  PLT 183 146*   Basic Metabolic Panel:  Recent Labs  Lab 09/24/20 1840 09/24/20 1840 09/25/20 1554 09/27/20 0838  NA 135  --   --  134*  K 3.6  --   --  3.8  CL 101  --   --  102  CO2 23  --   --  23  GLUCOSE 161*  --   --  130*  BUN 7*  --   --  9  CREATININE 0.48   < > 0.74 0.57  CALCIUM 8.6*  --   --  7.9*   < > = values in this interval not displayed.   Lipid Panel:  Recent Labs  Lab 09/27/20 0021  CHOL 162  TRIG 73  HDL 52  CHOLHDL 3.1  VLDL 15  LDLCALC 95   HgbA1c:  Recent Labs  Lab 09/27/20 0021  HGBA1C 5.4   Urine Drug Screen: No results for input(s): LABOPIA, COCAINSCRNUR, LABBENZ, AMPHETMU, THCU, LABBARB in the last 168 hours.  Alcohol Level No results for input(s): ETH in the last 168 hours.  IMAGING past 24 hours CT ANGIO CHEST PE W OR WO CONTRAST  Result Date: 09/26/2020 CLINICAL DATA:  Postoperative hypoxia. EXAM: CT  ANGIOGRAPHY CHEST WITH CONTRAST TECHNIQUE: Multidetector CT imaging of the chest was performed using the standard protocol during bolus administration of intravenous contrast. Multiplanar CT image reconstructions and MIPs were obtained to evaluate the vascular anatomy. CONTRAST:  38mL OMNIPAQUE IOHEXOL 350 MG/ML SOLN COMPARISON:  None. FINDINGS: Cardiovascular: There is mild to moderate severity calcification of the aortic arch. A mild amount of intraluminal low attenuation is seen involving anteromedial right upper lobe and posteromedial right lower lobe branches of the right pulmonary artery. Normal heart size without evidence of heart strain. No pericardial effusion. Mediastinum/Nodes: No enlarged mediastinal, hilar, or axillary lymph nodes. Thyroid gland, trachea, and esophagus demonstrate no significant findings. Lungs/Pleura: Mild to moderate severity linear scarring and/or atelectasis is seen within the bilateral lower lobes, right greater than left. There is no evidence of a pleural effusion or pneumothorax. Upper Abdomen: Multiple focal areas of low attenuation are seen scattered throughout the liver. The largest measures approximately 2.4 cm x 1.4 cm and is seen within the anteromedial aspect of the right lobe. Musculoskeletal: Chronic  compression fracture deformities are seen involving the T6, T8, T12 and L1 vertebral bodies. Review of the MIP images confirms the above findings. IMPRESSION: 1. Mild pulmonary embolism involving right upper lobe and right lower lobe branches of the right pulmonary artery. 2. Mild to moderate severity bilateral lower lobe linear scarring and/or atelectasis. 3. Multiple low-attenuation liver lesions which are nonspecific and may represent multiple hemangiomas. Due to the size of these lesions (greater than 1.5 cm) correlation with MRI of the abdomen is recommended. 4. Chronic compression fracture deformities involving the T6, T8, T12 and L1 vertebral bodies. 5. Aortic  atherosclerosis. Aortic Atherosclerosis (ICD10-I70.0). Electronically Signed   By: Aram Candela M.D.   On: 09/26/2020 16:29   CT CEREBRAL PERFUSION W CONTRAST  Result Date: 09/26/2020 CLINICAL DATA:  Stroke suspected. EXAM: CT ANGIOGRAPHY HEAD AND NECK CT PERFUSION BRAIN TECHNIQUE: Multidetector CT imaging of the head and neck was performed using the standard protocol during bolus administration of intravenous contrast. Multiplanar CT image reconstructions and MIPs were obtained to evaluate the vascular anatomy. Carotid stenosis measurements (when applicable) are obtained utilizing NASCET criteria, using the distal internal carotid diameter as the denominator. Multiphase CT imaging of the brain was performed following IV bolus contrast injection. Subsequent parametric perfusion maps were calculated using RAPID software. CONTRAST:  Administered contrast not known at this time. COMPARISON:  One hundred Omnipaque 350 intravenous contrast. FINDINGS: CTA NECK FINDINGS Aortic arch: Standard aortic branching. Atherosclerotic plaque within the visualized aortic arch. No hemodynamically significant innominate or proximal subclavian artery stenosis. Right carotid system: CCA and ICA patent within the neck without stenosis. Mild soft and calcified plaque within the carotid bulb. [place] of cervical ICA. Left carotid system: CCA and ICA patent within the neck without stenosis. Mild calcified plaque within the carotid bifurcation and carotid bulb. Tortuosity of the cervical ICA. Vertebral arteries: Patent within the neck. The left vertebral artery is dominant. There is severe narrowing of the right vertebral artery at the C4-C5 level due to compression from adjacent uncovertebral and facet hypertrophy (series 5, image 129). There is also mild nonspecific segmental narrowing of the right vertebral artery at the C2-C3 level. No significant stenosis of the left vertebral artery. Skeleton: No acute bony abnormality  or aggressive osseous lesion. Cervical spondylosis. C2-C3 and C7-T1 grade 1 anterolisthesis. Other neck: 2.2 cm left thyroid lobe nodule with internal foci of calcification. No cervical lymphadenopathy. Upper chest: No consolidation within the imaged lung apices. There are apparent small filling defects within bilateral upper lobe pulmonary arterial branches suspicious for small pulmonary emboli (series 5, image 1). Review of the MIP images confirms the above findings CTA HEAD FINDINGS Anterior circulation: The intracranial internal carotid arteries are patent. The M1 middle cerebral arteries are patent. Atherosclerotic irregularity of the M2 and more distal MCA branch vessels (greater on the right). However, no M2 proximal branch occlusion or high-grade proximal stenosis is identified. The anterior cerebral arteries are patent. No intracranial aneurysm is identified. Posterior circulation: The intracranial vertebral arteries are patent. The basilar artery is patent. The posterior cerebral arteries are patent. Posterior communicating arteries are hypoplastic or absent bilaterally. Venous sinuses: Within the limitations of contrast timing, no convincing thrombus. Anatomic variants: As described Review of the MIP images confirms the above findings CT Brain Perfusion Findings: ASPECTS: 10 CBF (<30%) Volume: 34mL Perfusion (Tmax>6.0s) volume: 54mL Mismatch Volume: 14mL Infarction Location:None identified These results were called by telephone at the time of interpretation on 09/26/2020 at 1:50 pm to provider Mayetta Castleman ,  who verbally acknowledged these results. IMPRESSION: CTA neck: 1. The bilateral common and internal carotid arteries are patent within the neck without stenosis. Mild atherosclerotic plaque within both carotid systems. 2. Vertebral arteries patent within the neck. Severe narrowing of the right vertebral artery at the C4-C5 level due to compression from adjacent uncovertebral and facet hypertrophy. Mild  nonspecific segmental narrowing of the right vertebral artery at the C2-C3 level. 3. Suspected small bilateral upper lobe pulmonary emboli at the imaged levels. Consider dedicated CT angiogram of the chest for further evaluation. CTA head: No intracranial large vessel occlusion or proximal high-grade arterial stenosis. CT perfusion head: The perfusion software identifies no core infarct. The perfusion software identifies no region of critically hypoperfused brain parenchyma utilizing the Tmax>6 seconds threshold. Electronically Signed   By: Jackey Loge DO   On: 09/26/2020 13:53   CT HEAD CODE STROKE WO CONTRAST  Result Date: 09/26/2020 CLINICAL DATA:  Code stroke. Neuro deficit, acute, stroke suspected. EXAM: CT HEAD WITHOUT CONTRAST TECHNIQUE: Contiguous axial images were obtained from the base of the skull through the vertex without intravenous contrast. COMPARISON:  Head CT 06/01/2018. FINDINGS: Brain: Mild generalized cerebral atrophy. Moderately advanced ill-defined hypoattenuation within the cerebral white matter is nonspecific, but compatible with chronic small vessel ischemic disease. There is no acute intracranial hemorrhage. No demarcated cortical infarct. No extra-axial fluid collection. No evidence of intracranial mass. No midline shift. Vascular: No hyperdense vessel. Skull: Atherosclerotic calcifications Sinuses/Orbits: Visualized orbits show no acute finding. No significant paranasal sinus disease at the imaged levels. ASPECTS [facility] Stroke Program Early CT Score) - Ganglionic level infarction (caudate, lentiform nuclei, internal capsule, insula, M1-M3 cortex):7 - Supraganglionic infarction (M4-M6 cortex): 3 Total score (0-10 with 10 being normal): 10 These results were communicated to Dr. Pearlean Brownie At 1:08 pmon 11/24/2021by text page via the [facility] messaging system. IMPRESSION: No evidence of acute intracranial abnormality. Mild cerebral atrophy and moderately advanced cerebral white matter  chronic small vessel ischemic disease, progressed as compared to the head CT of 06/01/2018. Mild cerebral atrophy. Electronically Signed   By: Jackey Loge DO   On: 09/26/2020 13:14   CT ANGIO NECK CODE STROKE  Result Date: 09/26/2020 CLINICAL DATA:  Stroke suspected. EXAM: CT ANGIOGRAPHY HEAD AND NECK CT PERFUSION BRAIN TECHNIQUE: Multidetector CT imaging of the head and neck was performed using the standard protocol during bolus administration of intravenous contrast. Multiplanar CT image reconstructions and MIPs were obtained to evaluate the vascular anatomy. Carotid stenosis measurements (when applicable) are obtained utilizing NASCET criteria, using the distal internal carotid diameter as the denominator. Multiphase CT imaging of the brain was performed following IV bolus contrast injection. Subsequent parametric perfusion maps were calculated using RAPID software. CONTRAST:  Administered contrast not known at this time. COMPARISON:  One hundred Omnipaque 350 intravenous contrast. FINDINGS: CTA NECK FINDINGS Aortic arch: Standard aortic branching. Atherosclerotic plaque within the visualized aortic arch. No hemodynamically significant innominate or proximal subclavian artery stenosis. Right carotid system: CCA and ICA patent within the neck without stenosis. Mild soft and calcified plaque within the carotid bulb. [place] of cervical ICA. Left carotid system: CCA and ICA patent within the neck without stenosis. Mild calcified plaque within the carotid bifurcation and carotid bulb. Tortuosity of the cervical ICA. Vertebral arteries: Patent within the neck. The left vertebral artery is dominant. There is severe narrowing of the right vertebral artery at the C4-C5 level due to compression from adjacent uncovertebral and facet hypertrophy (series 5, image 129). There is also  mild nonspecific segmental narrowing of the right vertebral artery at the C2-C3 level. No significant stenosis of the left vertebral  artery. Skeleton: No acute bony abnormality or aggressive osseous lesion. Cervical spondylosis. C2-C3 and C7-T1 grade 1 anterolisthesis. Other neck: 2.2 cm left thyroid lobe nodule with internal foci of calcification. No cervical lymphadenopathy. Upper chest: No consolidation within the imaged lung apices. There are apparent small filling defects within bilateral upper lobe pulmonary arterial branches suspicious for small pulmonary emboli (series 5, image 1). Review of the MIP images confirms the above findings CTA HEAD FINDINGS Anterior circulation: The intracranial internal carotid arteries are patent. The M1 middle cerebral arteries are patent. Atherosclerotic irregularity of the M2 and more distal MCA branch vessels (greater on the right). However, no M2 proximal branch occlusion or high-grade proximal stenosis is identified. The anterior cerebral arteries are patent. No intracranial aneurysm is identified. Posterior circulation: The intracranial vertebral arteries are patent. The basilar artery is patent. The posterior cerebral arteries are patent. Posterior communicating arteries are hypoplastic or absent bilaterally. Venous sinuses: Within the limitations of contrast timing, no convincing thrombus. Anatomic variants: As described Review of the MIP images confirms the above findings CT Brain Perfusion Findings: ASPECTS: 10 CBF (<30%) Volume: 0mL Perfusion (Tmax>6.0s) volume: 0mL Mismatch Volume: 0mL Infarction Location:None identified These results were called by telephone at the time of interpretation on 09/26/2020 at 1:50 pm to provider Kiam Bransfield , who verbally acknowledged these results. IMPRESSION: CTA neck: 1. The bilateral common and internal carotid arteries are patent within the neck without stenosis. Mild atherosclerotic plaque within both carotid systems. 2. Vertebral arteries patent within the neck. Severe narrowing of the right vertebral artery at the C4-C5 level due to compression from adjacent  uncovertebral and facet hypertrophy. Mild nonspecific segmental narrowing of the right vertebral artery at the C2-C3 level. 3. Suspected small bilateral upper lobe pulmonary emboli at the imaged levels. Consider dedicated CT angiogram of the chest for further evaluation. CTA head: No intracranial large vessel occlusion or proximal high-grade arterial stenosis. CT perfusion head: The perfusion software identifies no core infarct. The perfusion software identifies no region of critically hypoperfused brain parenchyma utilizing the Tmax>6 seconds threshold. Electronically Signed   By: Jackey Loge DO   On: 09/26/2020 13:53    PHYSICAL EXAM Pleasant frail elderly Caucasian lady not in distress.  She is hard of hearing. . Afebrile. Head is nontraumatic. Neck is supple without bruit.    Cardiac exam no murmur or gallop. Lungs are clear to auscultation. Distal pulses are well felt. Neurological Exam : Patient is awake alert she is oriented to person and place.  She has diminished attention, registration and recall.  She has poor insight into her condition.  She has easy distractibility and speech at times become tangential though it is clear without evidence of dysarthria or aphasia.  She follows simple midline and one-step commands.  Extraocular movements are full range without nystagmus.  She blinks to threat bilaterally.  Fundi not visualized.  Face is symmetric without weakness.  Tongue midline.  Motor system exam she is able to move all 4 extremities against gravity the right shoulder elevation seems to be limited and she blames this on mechanical pain.  Grip strength is symmetric fine finger movements are symmetric.  Deep tendon flexes symmetric plantars are downgoing.  Gait not tested. ASSESSMENT/PLAN Deanna Strong is a 86 y.o. female with history of dementia with psychosis on occasion, delirium, anemia, depression, HLD, hyperthyroidism, and IBS presenting 09/25/20  following a fall with L distal femur fx  s/p ORIF 11/24 who developed post op R arm weakness and random speech.    Possible left hemispheric TIA vs transient worsening of underlying dementia  Code Stroke CT head No acute abnormality. Atrophy. ASPECTS 10.     CTA head Unremarkable   CTA neck mild atherosclerosis B ICAs. Severe R VA narrowing at C4-5 d/t compression. Mild narrowing R VA at C2-3. Suspected small BUL PE  CT perfusion NO CORE, NO PENUMBRA  No need to repeat CT to confirm stroke - if present, will be too small to be seen and results will not change management  LE Doppler  pending   2D Echo pending   LDL 95  HgbA1c 5.4  VTE prophylaxis - IV heparin  No antithrombotic prior to admission, now on aspirin 81 mg daily and heparin IV. Recommend ongoingPE aspirin 81 at time of d/c.  Therapy recommendations:  SNF  Disposition:  pending   Acute PE  Post ORIF  On IV heparin  Hyperlipidemia  Home meds:  No statin  LDL 95  Consider statin   Other Stroke Risk Factors  Advanced Age >/= 75   Former Cigarette smoker  Other Active Problems  Distal L femur fx s/p ORIF 09/26/20  Acute blood loss anemia  Hx dementia/depression/psychosis  Osteoporosis/Vit D deficiency  Hx vertigo x 2 yrs per dtr, falling to the R. No syncope. Recommend PCP followup  Hospital day # 3  Patient has significant underlying cognitive impairment from dementia and had transient worsening of her deficits yesterday unclear whether this represents a true TIA because of the right upper extremity weakness could also be explained by mechanical pain and discomfort that she has.  Aspirin 81 mg alone will be enough for stroke prevention but given finding of pulmonary embolism and IV heparin and oral anticoagulation may be necessary.  Add statin for elevated lipids.  Stroke team will sign off.  Discussed with Dr. Jacqulyn Bath. Greater than 50% time during this 25-minute visit was spent on counseling and coordination of care about possible TIA  versus underlying dementia and answering questions.  Deanna Heady, MD  To contact Stroke Continuity provider, please refer to [email or web address]. After hours, contact General Neurology

## 2020-09-27 NOTE — Progress Notes (Signed)
PROGRESS NOTE    Deanna OrleansJutta Strong  ZOX:[id number] DOB: 06/04/1934 DOA: 09/24/2020 PCP: Daiva NakayamaPa, [facility]   Brief Narrative:  Patient is 86 year old female with past medical history of dementia, behavioral disturbances, chronic anemia, depression presents with complaint of fall found to have distal left femur fracture.  Underwent ORIF on 09/25/2020.  Assessment & Plan:  Distal left femur fracture: Status post fall.  Status post ORIF intramedullary femoral nailing on 11/23 by orthopedic surgery. -Profound osteoporosis Intra-Op operatively noted. -Continue pain medication.  Continue to monitor H&H closely. -PT recommended SNF  Acute right sided PE: -Reviewed CTA chest -Started patient on full dose heparin monitor H&H closely.  Will get Doppler ultrasound of bilateral lower extremities and transthoracic echo-still pending.  TIA versus transient worsening of underlying dementia: -Code stroke called on 11/24.  -Neurology consulted. -Underwent CT head, CTA head and neck, CT perfusion study which came back negative for acute stroke. -Appreciated neurology help-recommended no need to repeat CT to confirm stroke. -Neurology signed off -A1c, TSH: WNL, LDL: 95.  Started patient on aspirin 81 mg and atorvastatin 40 mg once daily.  -Evaluated by PT/OT/SLP  Acute blood loss anemia: H&H dropped from 9.7-7.9-7.0 -1 unit PRBC ordered by Ortho. -Monitor H&H closely  History of dementia/depression/psychosis: -Resides in ALF.  Independent on daily life activities. -cont. Supportive care  Osteoporosis/vitamin D deficiency: -Continue with vitamin D supplements.  Thrombocytopenia: No signs of active bleeding noted.  Could be heparin-induced.  Continue to monitor.  DVT prophylaxis: Full dose heparin Code Status: DNR Family Communication:  None present at bedside.  Plan of care discussed with patient in length and he verbalized understanding and agreed with it.  I called patient's  daughters over the phone and discussed plan of care and they both verbalized understanding.  Answered all of their questions.  Disposition Plan: SNF  Consultants:   Orthopedic surgery  Neurology  Procedures:   ORIF intramedullary femoral nailing  CT head  CTA head and neck  Antimicrobials:   None  Status is: Inpatient  Dispo: The patient is from: ALF              Anticipated d/c is to: SNF              Anticipated d/c date is: 2 days              Patient currently is not medically stable to d/c.  Subjective: Patient seen and examined.  Alert and did and his easy distractibility with her speech.  She denies any new complaints today. Objective: Vitals:   09/26/20 1952 09/26/20 2354 09/27/20 0350 09/27/20 0819  BP: (!) 103/57 106/67 117/66 119/70  Pulse: 89 85 84 82  Resp: 16 18 16 18   Temp: 98.6 F (37 C) 98.6 F (37 C) 98.8 F (37.1 C) 98.6 F (37 C)  TempSrc: Axillary Oral Axillary Oral  SpO2: 94% 96% 90% 97%  Weight:      Height:        Intake/Output Summary (Last 24 hours) at 09/27/2020 1434 Last data filed at 09/27/2020 0900 Gross per 24 hour  Intake 687.91 ml  Output 400 ml  Net 287.91 ml   Filed Weights   09/25/20 0232 09/25/20 0624  Weight: 52.3 kg 52.3 kg    Examination: General exam: Appears calm and comfortable, on room air Respiratory system: Clear to auscultation. Respiratory effort normal. Cardiovascular system: S1 & S2 heard, RRR. No JVD, murmurs, rubs, gallops or clicks. No pedal edema. Gastrointestinal system:  Abdomen is nondistended, soft and nontender. No organomegaly or masses felt. Normal bowel sounds heard. Central nervous system: Alert , distractible speech, pleasantly demented skin: No rashes, lesions or ulcers.   Data Reviewed: I have personally reviewed following labs and imaging studies  CBC: Recent Labs  Lab 09/25/20 1554 09/26/20 0612 09/26/20 1541 09/27/20 0021 09/27/20 1150  WBC 10.4 9.9 9.2 6.6 6.5  HGB 9.7*  7.9* 7.7* 7.0* 7.3*  HCT 30.5* 25.1* 24.7* 22.8* 23.4*  MCV 84.5 83.4 84.9 84.8 85.7  PLT 218 175 183 146* 163   Basic Metabolic Panel: Recent Labs  Lab 09/24/20 1840 09/25/20 1554 09/27/20 0838  NA 135  --  134*  K 3.6  --  3.8  CL 101  --  102  CO2 23  --  23  GLUCOSE 161*  --  130*  BUN 7*  --  9  CREATININE 0.48 0.74 0.57  CALCIUM 8.6*  --  7.9*   GFR: Estimated Creatinine Clearance: 41.4 mL/min (by C-G formula based on SCr of 0.57 mg/dL). Liver Function Tests: Recent Labs  Lab 09/24/20 1840  AST 23  ALT 16  ALKPHOS 78  BILITOT 0.9  PROT 6.3*  ALBUMIN 3.9   No results for input(s): LIPASE, AMYLASE in the last 168 hours. No results for input(s): AMMONIA in the last 168 hours. Coagulation Profile: No results for input(s): INR, PROTIME in the last 168 hours. Cardiac Enzymes: No results for input(s): CKTOTAL, CKMB, CKMBINDEX, TROPONINI in the last 168 hours. BNP (last 3 results) No results for input(s): PROBNP in the last 8760 hours. HbA1C: Recent Labs    09/27/20 0021  HGBA1C 5.4   CBG: Recent Labs  Lab 09/25/20 2335 09/26/20 0603 09/26/20 1229 09/26/20 1846  GLUCAP 133* 132* 112* 107*   Lipid Profile: Recent Labs    09/27/20 0021  CHOL 162  HDL 52  LDLCALC 95  TRIG 73  CHOLHDL 3.1   Thyroid Function Tests: Recent Labs    09/26/20 1541  TSH 0.285*   Anemia Panel: No results for input(s): VITAMINB12, FOLATE, FERRITIN, TIBC, IRON, RETICCTPCT in the last 72 hours. Sepsis Labs: No results for input(s): PROCALCITON, LATICACIDVEN in the last 168 hours.  Recent Results (from the past 240 hour(s))  Resp Panel by RT-PCR (Flu A&B, Covid) Nasopharyngeal Swab     Status: None   Collection Time: 09/24/20  6:40 PM   Specimen: Nasopharyngeal Swab; Nasopharyngeal(NP) swabs in vial transport medium  Result Value Ref Range Status   SARS Coronavirus 2 by RT PCR NEGATIVE NEGATIVE Final    Comment: (NOTE) SARS-CoV-2 target nucleic acids are NOT  DETECTED.  The SARS-CoV-2 RNA is generally detectable in upper respiratory specimens during the acute phase of infection. The lowest concentration of SARS-CoV-2 viral copies this assay can detect is 138 copies/mL. A negative result does not preclude SARS-Cov-2 infection and should not be used as the sole basis for treatment or other patient management decisions. A negative result may occur with  improper specimen collection/handling, submission of specimen other than nasopharyngeal swab, presence of viral mutation(s) within the areas targeted by this assay, and inadequate number of viral copies(<138 copies/mL). A negative result must be combined with clinical observations, patient history, and epidemiological information. The expected result is Negative.  Fact Sheet for Patients:  BloggerCourse.com  Fact Sheet for Healthcare Providers:  SeriousBroker.it  This test is no t yet approved or cleared by the [place] FDA and  has been authorized for detection and/or diagnosis of  SARS-CoV-2 by FDA under an Emergency Use Authorization (EUA). This EUA will remain  in effect (meaning this test can be used) for the duration of the COVID-19 declaration under Section 564(b)(1) of the Act, 21 U.S.C.section 360bbb-3(b)(1), unless the authorization is terminated  or revoked sooner.       Influenza A by PCR NEGATIVE NEGATIVE Final   Influenza B by PCR NEGATIVE NEGATIVE Final    Comment: (NOTE) The Xpert Xpress SARS-CoV-2/FLU/RSV plus assay is intended as an aid in the diagnosis of influenza from Nasopharyngeal swab specimens and should not be used as a sole basis for treatment. Nasal washings and aspirates are unacceptable for Xpert Xpress SARS-CoV-2/FLU/RSV testing.  Fact Sheet for Patients: BloggerCourse.com  Fact Sheet for Healthcare Providers: SeriousBroker.it  This test is not yet  approved or cleared by the [place] FDA and has been authorized for detection and/or diagnosis of SARS-CoV-2 by FDA under an Emergency Use Authorization (EUA). This EUA will remain in effect (meaning this test can be used) for the duration of the COVID-19 declaration under Section 564(b)(1) of the Act, 21 U.S.C. section 360bbb-3(b)(1), unless the authorization is terminated or revoked.  Performed at [facility], [address], [place], [place] [address]       Radiology Studies: CT ANGIO CHEST PE W OR WO CONTRAST  Result Date: 09/26/2020 CLINICAL DATA:  Postoperative hypoxia. EXAM: CT ANGIOGRAPHY CHEST WITH CONTRAST TECHNIQUE: Multidetector CT imaging of the chest was performed using the standard protocol during bolus administration of intravenous contrast. Multiplanar CT image reconstructions and MIPs were obtained to evaluate the vascular anatomy. CONTRAST:  67mL OMNIPAQUE IOHEXOL 350 MG/ML SOLN COMPARISON:  None. FINDINGS: Cardiovascular: There is mild to moderate severity calcification of the aortic arch. A mild amount of intraluminal low attenuation is seen involving anteromedial right upper lobe and posteromedial right lower lobe branches of the right pulmonary artery. Normal heart size without evidence of heart strain. No pericardial effusion. Mediastinum/Nodes: No enlarged mediastinal, hilar, or axillary lymph nodes. Thyroid gland, trachea, and esophagus demonstrate no significant findings. Lungs/Pleura: Mild to moderate severity linear scarring and/or atelectasis is seen within the bilateral lower lobes, right greater than left. There is no evidence of a pleural effusion or pneumothorax. Upper Abdomen: Multiple focal areas of low attenuation are seen scattered throughout the liver. The largest measures approximately 2.4 cm x 1.4 cm and is seen within the anteromedial aspect of the right lobe. Musculoskeletal: Chronic compression fracture deformities are seen  involving the T6, T8, T12 and L1 vertebral bodies. Review of the MIP images confirms the above findings. IMPRESSION: 1. Mild pulmonary embolism involving right upper lobe and right lower lobe branches of the right pulmonary artery. 2. Mild to moderate severity bilateral lower lobe linear scarring and/or atelectasis. 3. Multiple low-attenuation liver lesions which are nonspecific and may represent multiple hemangiomas. Due to the size of these lesions (greater than 1.5 cm) correlation with MRI of the abdomen is recommended. 4. Chronic compression fracture deformities involving the T6, T8, T12 and L1 vertebral bodies. 5. Aortic atherosclerosis. Aortic Atherosclerosis (ICD10-I70.0). Electronically Signed   By: Aram Candela M.D.   On: 09/26/2020 16:29   CT CEREBRAL PERFUSION W CONTRAST  Result Date: 09/26/2020 CLINICAL DATA:  Stroke suspected. EXAM: CT ANGIOGRAPHY HEAD AND NECK CT PERFUSION BRAIN TECHNIQUE: Multidetector CT imaging of the head and neck was performed using the standard protocol during bolus administration of intravenous contrast. Multiplanar CT image reconstructions and MIPs were obtained to evaluate the vascular anatomy. Carotid  stenosis measurements (when applicable) are obtained utilizing NASCET criteria, using the distal internal carotid diameter as the denominator. Multiphase CT imaging of the brain was performed following IV bolus contrast injection. Subsequent parametric perfusion maps were calculated using RAPID software. CONTRAST:  Administered contrast not known at this time. COMPARISON:  One hundred Omnipaque 350 intravenous contrast. FINDINGS: CTA NECK FINDINGS Aortic arch: Standard aortic branching. Atherosclerotic plaque within the visualized aortic arch. No hemodynamically significant innominate or proximal subclavian artery stenosis. Right carotid system: CCA and ICA patent within the neck without stenosis. Mild soft and calcified plaque within the carotid bulb. [place] of  cervical ICA. Left carotid system: CCA and ICA patent within the neck without stenosis. Mild calcified plaque within the carotid bifurcation and carotid bulb. Tortuosity of the cervical ICA. Vertebral arteries: Patent within the neck. The left vertebral artery is dominant. There is severe narrowing of the right vertebral artery at the C4-C5 level due to compression from adjacent uncovertebral and facet hypertrophy (series 5, image 129). There is also mild nonspecific segmental narrowing of the right vertebral artery at the C2-C3 level. No significant stenosis of the left vertebral artery. Skeleton: No acute bony abnormality or aggressive osseous lesion. Cervical spondylosis. C2-C3 and C7-T1 grade 1 anterolisthesis. Other neck: 2.2 cm left thyroid lobe nodule with internal foci of calcification. No cervical lymphadenopathy. Upper chest: No consolidation within the imaged lung apices. There are apparent small filling defects within bilateral upper lobe pulmonary arterial branches suspicious for small pulmonary emboli (series 5, image 1). Review of the MIP images confirms the above findings CTA HEAD FINDINGS Anterior circulation: The intracranial internal carotid arteries are patent. The M1 middle cerebral arteries are patent. Atherosclerotic irregularity of the M2 and more distal MCA branch vessels (greater on the right). However, no M2 proximal branch occlusion or high-grade proximal stenosis is identified. The anterior cerebral arteries are patent. No intracranial aneurysm is identified. Posterior circulation: The intracranial vertebral arteries are patent. The basilar artery is patent. The posterior cerebral arteries are patent. Posterior communicating arteries are hypoplastic or absent bilaterally. Venous sinuses: Within the limitations of contrast timing, no convincing thrombus. Anatomic variants: As described Review of the MIP images confirms the above findings CT Brain Perfusion Findings: ASPECTS: 10 CBF (<30%)  Volume: 0mL Perfusion (Tmax>6.0s) volume: 0mL Mismatch Volume: 0mL Infarction Location:None identified These results were called by telephone at the time of interpretation on 09/26/2020 at 1:50 pm to provider PRAMOD SETHI , who verbally acknowledged these results. IMPRESSION: CTA neck: 1. The bilateral common and internal carotid arteries are patent within the neck without stenosis. Mild atherosclerotic plaque within both carotid systems. 2. Vertebral arteries patent within the neck. Severe narrowing of the right vertebral artery at the C4-C5 level due to compression from adjacent uncovertebral and facet hypertrophy. Mild nonspecific segmental narrowing of the right vertebral artery at the C2-C3 level. 3. Suspected small bilateral upper lobe pulmonary emboli at the imaged levels. Consider dedicated CT angiogram of the chest for further evaluation. CTA head: No intracranial large vessel occlusion or proximal high-grade arterial stenosis. CT perfusion head: The perfusion software identifies no core infarct. The perfusion software identifies no region of critically hypoperfused brain parenchyma utilizing the Tmax>6 seconds threshold. Electronically Signed   By: Jackey Loge DO   On: 09/26/2020 13:53   DG FEMUR PORT MIN 2 VIEWS LEFT  Result Date: 09/25/2020 CLINICAL DATA:  Postop fracture. EXAM: LEFT FEMUR PORTABLE 2 VIEWS COMPARISON:  Intraoperative x-ray left femur 09/25/2020, x-ray left femur 09/24/2020  FINDINGS: Status post intramedullary rod with interlocking screw fixation of the left femoral shaft. Improved anatomical alignment and fixation of a comminuted, posteriorly displaced, acute distal left femoral shaft fracture. There is no evidence of a new displaced fracture or aggressive appearing focal bone lesions. Associated subcutaneus soft tissue edema consistent with postsurgical changes. IMPRESSION: Status post open reduction internal fixation of a left distal femoral shaft fracture with intramedullary rod  and interlocking screw fixation. Electronically Signed   By: Tish Frederickson M.D.   On: 09/25/2020 16:45   CT HEAD CODE STROKE WO CONTRAST  Result Date: 09/26/2020 CLINICAL DATA:  Code stroke. Neuro deficit, acute, stroke suspected. EXAM: CT HEAD WITHOUT CONTRAST TECHNIQUE: Contiguous axial images were obtained from the base of the skull through the vertex without intravenous contrast. COMPARISON:  Head CT 06/01/2018. FINDINGS: Brain: Mild generalized cerebral atrophy. Moderately advanced ill-defined hypoattenuation within the cerebral white matter is nonspecific, but compatible with chronic small vessel ischemic disease. There is no acute intracranial hemorrhage. No demarcated cortical infarct. No extra-axial fluid collection. No evidence of intracranial mass. No midline shift. Vascular: No hyperdense vessel. Skull: Atherosclerotic calcifications Sinuses/Orbits: Visualized orbits show no acute finding. No significant paranasal sinus disease at the imaged levels. ASPECTS [facility] Stroke Program Early CT Score) - Ganglionic level infarction (caudate, lentiform nuclei, internal capsule, insula, M1-M3 cortex):7 - Supraganglionic infarction (M4-M6 cortex): 3 Total score (0-10 with 10 being normal): 10 These results were communicated to Dr. Pearlean Brownie At 1:08 pmon 11/24/2021by text page via the [facility] messaging system. IMPRESSION: No evidence of acute intracranial abnormality. Mild cerebral atrophy and moderately advanced cerebral white matter chronic small vessel ischemic disease, progressed as compared to the head CT of 06/01/2018. Mild cerebral atrophy. Electronically Signed   By: Jackey Loge DO   On: 09/26/2020 13:14   CT ANGIO NECK CODE STROKE  Result Date: 09/26/2020 CLINICAL DATA:  Stroke suspected. EXAM: CT ANGIOGRAPHY HEAD AND NECK CT PERFUSION BRAIN TECHNIQUE: Multidetector CT imaging of the head and neck was performed using the standard protocol during bolus administration of intravenous contrast.  Multiplanar CT image reconstructions and MIPs were obtained to evaluate the vascular anatomy. Carotid stenosis measurements (when applicable) are obtained utilizing NASCET criteria, using the distal internal carotid diameter as the denominator. Multiphase CT imaging of the brain was performed following IV bolus contrast injection. Subsequent parametric perfusion maps were calculated using RAPID software. CONTRAST:  Administered contrast not known at this time. COMPARISON:  One hundred Omnipaque 350 intravenous contrast. FINDINGS: CTA NECK FINDINGS Aortic arch: Standard aortic branching. Atherosclerotic plaque within the visualized aortic arch. No hemodynamically significant innominate or proximal subclavian artery stenosis. Right carotid system: CCA and ICA patent within the neck without stenosis. Mild soft and calcified plaque within the carotid bulb. [place] of cervical ICA. Left carotid system: CCA and ICA patent within the neck without stenosis. Mild calcified plaque within the carotid bifurcation and carotid bulb. Tortuosity of the cervical ICA. Vertebral arteries: Patent within the neck. The left vertebral artery is dominant. There is severe narrowing of the right vertebral artery at the C4-C5 level due to compression from adjacent uncovertebral and facet hypertrophy (series 5, image 129). There is also mild nonspecific segmental narrowing of the right vertebral artery at the C2-C3 level. No significant stenosis of the left vertebral artery. Skeleton: No acute bony abnormality or aggressive osseous lesion. Cervical spondylosis. C2-C3 and C7-T1 grade 1 anterolisthesis. Other neck: 2.2 cm left thyroid lobe nodule with internal foci of calcification. No cervical lymphadenopathy. Upper  chest: No consolidation within the imaged lung apices. There are apparent small filling defects within bilateral upper lobe pulmonary arterial branches suspicious for small pulmonary emboli (series 5, image 1). Review of the  MIP images confirms the above findings CTA HEAD FINDINGS Anterior circulation: The intracranial internal carotid arteries are patent. The M1 middle cerebral arteries are patent. Atherosclerotic irregularity of the M2 and more distal MCA branch vessels (greater on the right). However, no M2 proximal branch occlusion or high-grade proximal stenosis is identified. The anterior cerebral arteries are patent. No intracranial aneurysm is identified. Posterior circulation: The intracranial vertebral arteries are patent. The basilar artery is patent. The posterior cerebral arteries are patent. Posterior communicating arteries are hypoplastic or absent bilaterally. Venous sinuses: Within the limitations of contrast timing, no convincing thrombus. Anatomic variants: As described Review of the MIP images confirms the above findings CT Brain Perfusion Findings: ASPECTS: 10 CBF (<30%) Volume: 39mL Perfusion (Tmax>6.0s) volume: 86mL Mismatch Volume: 80mL Infarction Location:None identified These results were called by telephone at the time of interpretation on 09/26/2020 at 1:50 pm to provider PRAMOD SETHI , who verbally acknowledged these results. IMPRESSION: CTA neck: 1. The bilateral common and internal carotid arteries are patent within the neck without stenosis. Mild atherosclerotic plaque within both carotid systems. 2. Vertebral arteries patent within the neck. Severe narrowing of the right vertebral artery at the C4-C5 level due to compression from adjacent uncovertebral and facet hypertrophy. Mild nonspecific segmental narrowing of the right vertebral artery at the C2-C3 level. 3. Suspected small bilateral upper lobe pulmonary emboli at the imaged levels. Consider dedicated CT angiogram of the chest for further evaluation. CTA head: No intracranial large vessel occlusion or proximal high-grade arterial stenosis. CT perfusion head: The perfusion software identifies no core infarct. The perfusion software identifies no region  of critically hypoperfused brain parenchyma utilizing the Tmax>6 seconds threshold. Electronically Signed   By: Jackey Loge DO   On: 09/26/2020 13:53    Scheduled Meds: . sodium chloride   Intravenous Once  . acetaminophen  500 mg Oral Q8H  . acetaminophen  650 mg Oral Once  . vitamin C  500 mg Oral Daily  . aspirin EC  81 mg Oral Daily  . atorvastatin  40 mg Oral Daily  . Chlorhexidine Gluconate Cloth  6 each Topical Daily  . cholecalciferol  2,000 Units Oral BID  . docusate sodium  100 mg Oral BID  . multivitamin with minerals  1 tablet Oral Daily  . Vitamin D (Ergocalciferol)  50,000 Units Oral Q7 days   Continuous Infusions: . heparin 1,100 Units/hr (09/27/20 0953)     LOS: 3 days   Time spent: 45 minutes  Dorleen Kissel Estill Cotta, MD [facility]  If 7PM-7AM, please contact night-coverage [email or web address] 09/27/2020, 2:34 PM

## 2020-09-27 NOTE — Progress Notes (Signed)
  Speech Language Pathology Treatment: Dysphagia  Patient Details Name: Deanna Strong MRN: [id number] DOB: Sep 17, 1934 Today's Date: 09/27/2020 Time: 0920-0930 SLP Time Calculation (min) (ACUTE ONLY): 10 min  Assessment / Plan / Recommendation Clinical Impression  F/u after yesterday's swallow evaluation then subsequent code stroke.  CT negative, neurology dx TIA.  Speech/communication are back to baseline, and swallow function consistent with yesterday's exam.  Pt accepted only minimal sips of water and bites of food, primarily due to claims that "there is medicine in that." Followed by further refusal to eat any items from her breakfast tray, despite my reassurance that there was no medication in her food.  Despite limited intake, there were no indications of an acute dysphagia, no s/s of aspiration.  Recommend continuing current regular diet, thin liquids.  No SLP f/u is needed -  Our service will sign off.    HPI HPI: Pt is an 86 y.o. female with history of dementia with psychosis who was brought to the ED after a ground-level fall and subsequent c/o left thigh pain. Pt found to have distal left femur fracture and underwent ORIF on 09/25/2020.  COde stroke called 11/24, CT negative, likely TIA per neuro notes.      SLP Plan  All goals met       Recommendations  Diet recommendations: Regular;Thin liquid Liquids provided via: Cup;Straw Medication Administration: Whole meds with puree Supervision: Patient able to self feed Compensations: Minimize environmental distractions                Oral Care Recommendations: Oral care BID Follow up Recommendations: None Plan: All goals met       GO               Deanna Lucien L. Tivis Ringer, [place] Office number [phone] Pager [phone]  Juan Quam Deanna Strong 09/27/2020, 9:30 AM

## 2020-09-27 NOTE — Progress Notes (Signed)
Blood transfusion consent obtained from daughter via phone, verified with 2nd nurse.

## 2020-09-27 NOTE — Progress Notes (Signed)
Notified MD patient refused all meds today. Will try to offer to pt again.

## 2020-09-27 NOTE — Plan of Care (Signed)

## 2020-09-27 NOTE — Progress Notes (Signed)
Patient is agitated. Yelling not wanted lab tech to draw her blood for type and cross.  LabTech will come back.

## 2020-09-27 NOTE — Progress Notes (Signed)
ANTICOAGULATION CONSULT NOTE - Follow Up Consult  Pharmacy Consult for heparin Indication: pulmonary embolus  Labs: Recent Labs    09/24/20 1840 09/24/20 1840 09/25/20 1554 09/25/20 1554 09/26/20 0612 09/26/20 0612 09/26/20 1541 09/27/20 0021  HGB 11.9*   < > 9.7*   < > 7.9*   < > 7.7* 7.0*  HCT 37.6   < > 30.5*   < > 25.1*  --  24.7* 22.8*  PLT 286   < > 218   < > 175  --  183 146*  HEPARINUNFRC  --   --   --   --   --   --   --  0.14*  CREATININE 0.48  --  0.74  --   --   --   --   --    < > = values in this interval not displayed.    Assessment: 86yo female subtherapeutic on heparin with initial dosing for PE (confirmed on CT); no gtt issues or signs of bleeding per RN.  Goal of Therapy:  Heparin level 0.3-0.7 units/ml   Plan:  Will increase heparin gtt by 3 units/kg/hr to 1000 units/hr and check level in 8 hours.    Vernard Gambles, PharmD, BCPS  09/27/2020,1:09 AM

## 2020-09-27 NOTE — Progress Notes (Signed)
[facility]thopaedic Trauma [facility] Progress Note  Patient ID: Deanna Strong MRN: [id number] DOB/AGE: 08-Mar-1934 86 y.o.  Subjective:  Sitting up in chair  Appears well Pleasant  Goes off on tangents when speaking   Events yesterday noted and discussed with medicine    ROS As above  Objective:   VITALS:   Vitals:   09/26/20 1952 09/26/20 2354 09/27/20 0350 09/27/20 0819  BP: (!) 103/57 106/67 117/66 119/70  Pulse: 89 85 84 82  Resp: 16 18 16 18   Temp: 98.6 F (37 C) 98.6 F (37 C) 98.8 F (37.1 C) 98.6 F (37 C)  TempSrc: Axillary Oral Axillary Oral  SpO2: 94% 96% 90% 97%  Weight:      Height:        Estimated body mass index is 21.09 kg/m as calculated from the following:   Height as of this encounter: 5\' 2"  (1.575 m).   Weight as of this encounter: 52.3 kg.   Intake/Output      11/24 0701 - 11/25 0700 11/25 0701 - 11/26 0700   P.O. 920 240   I.V. (mL/kg) 7.9 (0.2)    IV Piggyback     Total Intake(mL/kg) 927.9 (17.7) 240 (4.6)   Urine (mL/kg/hr) 900 (0.7)    Stool 0    Blood     Total Output 900    Net +27.9 +240        Urine Occurrence  1 x     LABS  Results for orders placed or performed during the hospital encounter of 09/24/20 (from the past 24 hour(s))  Glucose, capillary     Status: Abnormal   Collection Time: 09/26/20 12:29 PM  Result Value Ref Range   Glucose-Capillary 112 (H) 70 - 99 mg/dL  CBC     Status: Abnormal   Collection Time: 09/26/20  3:41 PM  Result Value Ref Range   WBC 9.2 4.0 - 10.5 K/uL   RBC 2.91 (L) 3.87 - 5.11 MIL/uL   Hemoglobin 7.7 (L) 12.0 - 15.0 g/dL   HCT 09/28/20 (L) 36 - 46 %   MCV 84.9 80.0 - 100.0 fL   MCH 26.5 26.0 - 34.0 pg   MCHC 31.2 30.0 - 36.0 g/dL   RDW 09/28/20 62.8 - 36.6 %   Platelets 183 150 - 400 K/uL   nRBC 0.0 0.0 - 0.2 %  TSH     Status: Abnormal   Collection Time: 09/26/20  3:41 PM  Result Value Ref Range   TSH 0.285 (L) 0.350 -  4.500 uIU/mL  Glucose, capillary     Status: Abnormal   Collection Time: 09/26/20  6:46 PM  Result Value Ref Range   Glucose-Capillary 107 (H) 70 - 99 mg/dL  Heparin level (unfractionated)     Status: Abnormal   Collection Time: 09/27/20 12:21 AM  Result Value Ref Range   Heparin Unfractionated 0.14 (L) 0.30 - 0.70 IU/mL  CBC     Status: Abnormal   Collection Time: 09/27/20 12:21 AM  Result Value Ref Range   WBC 6.6 4.0 - 10.5 K/uL   RBC 2.69 (L) 3.87 - 5.11 MIL/uL   Hemoglobin 7.0 (L) 12.0 - 15.0 g/dL   HCT 09/29/20 (L) 36 - 46 %   MCV 84.8 80.0 - 100.0 fL   MCH 26.0 26.0 - 34.0 pg  MCHC 30.7 30.0 - 36.0 g/dL   RDW 62.7 03.5 - 00.9 %   Platelets 146 (L) 150 - 400 K/uL   nRBC 0.0 0.0 - 0.2 %  Hemoglobin A1c     Status: None   Collection Time: 09/27/20 12:21 AM  Result Value Ref Range   Hgb A1c MFr Bld 5.4 4.8 - 5.6 %   Mean Plasma Glucose 108.28 mg/dL  Lipid panel     Status: None   Collection Time: 09/27/20 12:21 AM  Result Value Ref Range   Cholesterol 162 0 - 200 mg/dL   Triglycerides 73 <381 mg/dL   HDL 52 >82 mg/dL   Total CHOL/HDL Ratio 3.1 RATIO   VLDL 15 0 - 40 mg/dL   LDL Cholesterol 95 0 - 99 mg/dL  Heparin level (unfractionated)     Status: Abnormal   Collection Time: 09/27/20  8:38 AM  Result Value Ref Range   Heparin Unfractionated 0.24 (L) 0.30 - 0.70 IU/mL  Basic metabolic panel     Status: Abnormal   Collection Time: 09/27/20  8:38 AM  Result Value Ref Range   Sodium 134 (L) 135 - 145 mmol/L   Potassium 3.8 3.5 - 5.1 mmol/L   Chloride 102 98 - 111 mmol/L   CO2 23 22 - 32 mmol/L   Glucose, Bld 130 (H) 70 - 99 mg/dL   BUN 9 8 - 23 mg/dL   Creatinine, Ser 9.93 0.44 - 1.00 mg/dL   Calcium 7.9 (L) 8.9 - 10.3 mg/dL   GFR, Estimated >71 >69 mL/min   Anion gap 9 5 - 15     PHYSICAL EXAM:   Gen: sitting up in chair, NAD Lungs: unlabored Ext:       Left Lower Extremity              Dressings that are on are intact  Anterior knee incision looks good,  no active drainage   Moderate knee effusion but this is expected              Ext warm              Minimal swelling              Distal motor and sensory functions intact             + DP pulse              No DCT   Assessment/Plan: 2 Days Post-Op   Principal Problem:   Femur fracture (HCC) Active Problems:   Dementia (HCC)   Femur fracture, left (HCC)   Anti-infectives (From admission, onward)   Start     Dose/Rate Route Frequency Ordered Stop   09/25/20 1800  ceFAZolin (ANCEF) IVPB 2g/100 mL premix        2 g 200 mL/hr over 30 Minutes Intravenous Every 6 hours 09/25/20 1502 09/26/20 0224   09/25/20 1015  ceFAZolin (ANCEF) IVPB 2g/100 mL premix        2 g 200 mL/hr over 30 Minutes Intravenous On call to O.R. 09/25/20 1013 09/25/20 1139    .  POD/HD#: 2  86 y/o female, ground level fall, L distal 1/3 femoral shaft fracture    -ground level fall   - L distal 1/3 femoral shaft fracture s/p IMN              NWB L leg             Unrestricted ROM L knee and  hip              PT/OT             Ice prn Elevate leg for swelling and pain control             Dressing changes as needed   Ok to shower/bathe    - Pain management:             Tylenol             Minimize narcotics   - ABL anemia/Hemodynamics              will transfuse 1 unit PRBCs today   Only POD 2, think she will trend down a bit more    - Medical issues              Per primary               -R Lung PE  On heparin   Per medicine and pharmacy    - ID:              periop abx completed    - Metabolic Bone Disease:             Vitamin d deficiency                          Supplement             Fracture suggestive of osteoporosis             Fracture liaison consult    - Activity:             As above   - Impediments to fracture healing:             Osteoporosis             Vitamin d deficiency    - Dispo:             Ortho issues stable             Will likely need SNF                           TOC consult               Mearl Latin, PA-C [phone] (C) 09/27/2020, 11:45 AM  [facility] [address] [place] [address] [phone] [phone] (F)    After 5pm and on the weekends please log on to Amion, go to orthopaedics and the look under the [facility] Call for the provider(s) on call. You can also call our office at [phone] and then follow the prompts to be connected to the call team.

## 2020-09-27 NOTE — Progress Notes (Signed)
Offered patient meds again. Patient continue to refused.

## 2020-09-27 NOTE — Progress Notes (Signed)
ANTICOAGULATION CONSULT NOTE  Pharmacy Consult for heparin Indication: PE  Heparin dosing weight - 52.3 kg  Labs: Recent Labs    09/25/20 1554 09/26/20 0612 09/26/20 1541 09/26/20 1541 09/27/20 0021 09/27/20 0838 09/27/20 1150 09/27/20 1810  HGB 9.7*   < > 7.7*   < > 7.0*  --  7.3*  --   HCT 30.5*   < > 24.7*  --  22.8*  --  23.4*  --   PLT 218   < > 183  --  146*  --  163  --   HEPARINUNFRC  --   --   --   --  0.14* 0.24*  --  0.23*  CREATININE 0.74  --   --   --   --  0.57  --   --    < > = values in this interval not displayed.    Assessment: 26 YOF presenting s/p ground level fall, L distal femoral shaft fracture, s/p repair 11/23.  Patient previously ordered Lovenox prophylaxis post-op but refused - no doses given. Now with PE confirmed on CTA chest.  Heparin level remains sub-therapeutic and basically unchanged.  Confirmed with RN that pump is infusing at the correct rate and there hasn't been any issue with the infusion.  No bleeding, but RN reports that patient will get transfusion.  Goal of Therapy:  Heparin level 0.3-0.7 units/ml Monitor platelets by anticoagulation protocol: Yes   Plan:  Increase heparin infusion to 1250 units/hr F/U AM labs Monitor for bleeding  Caily Rakers D. Laney Potash, PharmD, BCPS, BCCCP 09/27/2020, 7:41 PM

## 2020-09-27 NOTE — Progress Notes (Signed)
Attempted lower extremity venous duplex, patient bathing at this time. Will try again as schedule permits.  09/27/2020 10:11 AM Eula Fried., MHA, RVT, RDCS, RDMS

## 2020-09-27 NOTE — Progress Notes (Signed)
Called and talked to  phebotomist to notify nurse 20 min before blood draw, nurse can give her med for agitation.

## 2020-09-27 NOTE — Progress Notes (Signed)
ANTICOAGULATION CONSULT NOTE  Pharmacy Consult for heparin Indication: PE  Heparin dosing weight - 52.3 kg  Labs: Recent Labs    09/24/20 1840 09/24/20 1840 09/25/20 1554 09/25/20 1554 09/26/20 0612 09/26/20 0612 09/26/20 1541 09/27/20 0021 09/27/20 0838  HGB 11.9*   < > 9.7*   < > 7.9*   < > 7.7* 7.0*  --   HCT 37.6   < > 30.5*   < > 25.1*  --  24.7* 22.8*  --   PLT 286   < > 218   < > 175  --  183 146*  --   HEPARINUNFRC  --   --   --   --   --   --   --  0.14* 0.24*  CREATININE 0.48  --  0.74  --   --   --   --   --  0.57   < > = values in this interval not displayed.    Assessment: 65 YOF presenting s/p ground level fall, L distal femoral shaft fracture, s/p repair 11/23.  Patient previously ordered Lovenox prophylaxis post-op but refused - no doses given. Now with PE confirmed on CTA chest. Noted Hg drop post-op to 7 today, plt down to 146. No current active bleed issues reported.  After subtherapeutic heparin level overnight with subsequent increased infusion rate to 1000 u/kg/hr, 8-hour level this AM still subtherapeutic at 0.24. Will increase by 2 U/kg/hr to 1100 u/hr - no bolus. Will assess another 8-hour level. Aiming for lower end of goal given declining Hb and PLT. Nursing denied infusion interruption or obstruction.  Goal of Therapy:  Heparin level 0.3-0.7 units/ml Monitor platelets by anticoagulation protocol: Yes   Plan:  Increase heparin infusion to 1100 u/hr Check 8hr heparin level Monitor daily heparin level and CBC, s/sx bleeding  Margarite Gouge, PharmD PGY2 ID Pharmacy Resident Phone between 7 am - 3:30 pm: [phone]  Please check AMION for all [facility] Pharmacy phone numbers After 10:00 PM, call Main Pharmacy [phone]  09/27/2020 9:44 AM

## 2020-09-27 NOTE — Progress Notes (Signed)
Patient refused her scheduled medications stating "I don't take these medications.  I don't take Tylenol".  Patient also refused to have staff check her glucose.  She stated "my doctor said I should not".  Staff have tried numerous times to educate patient but she refuses.

## 2020-09-27 NOTE — Progress Notes (Signed)
Echocardiogram has been attempted. Tech spoke with patient and she is frustrated and confused as to why she is unable to be with her family on Thanksgiving. We will try again tomorrow.   [facility] Jarett Dralle RDCS

## 2020-09-28 ENCOUNTER — Inpatient Hospital Stay (HOSPITAL_COMMUNITY): Payer: Medicare Other

## 2020-09-28 DIAGNOSIS — I2699 Other pulmonary embolism without acute cor pulmonale: Secondary | ICD-10-CM

## 2020-09-28 DIAGNOSIS — I361 Nonrheumatic tricuspid (valve) insufficiency: Secondary | ICD-10-CM | POA: Diagnosis not present

## 2020-09-28 LAB — CBC
HCT: 24.9 % — ABNORMAL LOW (ref 36.0–46.0)
Hemoglobin: 7.9 g/dL — ABNORMAL LOW (ref 12.0–15.0)
MCH: 27.6 pg (ref 26.0–34.0)
MCHC: 31.7 g/dL (ref 30.0–36.0)
MCV: 87.1 fL (ref 80.0–100.0)
Platelets: 179 10*3/uL (ref 150–400)
RBC: 2.86 MIL/uL — ABNORMAL LOW (ref 3.87–5.11)
RDW: 14.9 % (ref 11.5–15.5)
WBC: 5.7 10*3/uL (ref 4.0–10.5)
nRBC: 0 % (ref 0.0–0.2)

## 2020-09-28 LAB — ECHOCARDIOGRAM COMPLETE
Area-P 1/2: 3.91 cm2
Height: 62 in
S' Lateral: 2.7 cm
Weight: 1844.81 oz

## 2020-09-28 LAB — BPAM RBC
Blood Product Expiration Date: 202112252359
ISSUE DATE / TIME: 202111252311
Unit Type and Rh: 6200

## 2020-09-28 LAB — ABO/RH: ABO/RH(D): A POS

## 2020-09-28 LAB — BASIC METABOLIC PANEL
Anion gap: 10 (ref 5–15)
BUN: 9 mg/dL (ref 8–23)
CO2: 24 mmol/L (ref 22–32)
Calcium: 8.3 mg/dL — ABNORMAL LOW (ref 8.9–10.3)
Chloride: 102 mmol/L (ref 98–111)
Creatinine, Ser: 0.57 mg/dL (ref 0.44–1.00)
GFR, Estimated: >60 mL/min (ref >60)
Glucose, Bld: 101 mg/dL — ABNORMAL HIGH (ref 70–99)
Potassium: 3.9 mmol/L (ref 3.5–5.1)
Sodium: 136 mmol/L (ref 135–145)

## 2020-09-28 LAB — TYPE AND SCREEN
ABO/RH(D): A POS
Antibody Screen: NEGATIVE
Unit division: 0

## 2020-09-28 LAB — HEPARIN LEVEL (UNFRACTIONATED)
Heparin Unfractionated: 0.48 IU/mL (ref 0.30–0.70)
Heparin Unfractionated: <0.1 IU/mL — ABNORMAL LOW (ref 0.30–0.70)

## 2020-09-28 LAB — GLUCOSE, CAPILLARY
Glucose-Capillary: 108 mg/dL — ABNORMAL HIGH (ref 70–99)
Glucose-Capillary: 112 mg/dL — ABNORMAL HIGH (ref 70–99)
Glucose-Capillary: 136 mg/dL — ABNORMAL HIGH (ref 70–99)
Glucose-Capillary: 96 mg/dL (ref 70–99)

## 2020-09-28 MED ORDER — VITAMIN D (ERGOCALCIFEROL) 1.25 MG (50000 UNIT) PO CAPS: 50000.0000 [IU] | ORAL_CAPSULE | ORAL | Status: AC

## 2020-09-28 MED ORDER — ACETAMINOPHEN 325 MG PO TABS
325.0000 mg | ORAL_TABLET | Freq: Four times a day (QID) | ORAL | 0 refills | Status: AC | PRN
Start: 2020-09-28 — End: ?

## 2020-09-28 MED ORDER — HEPARIN (PORCINE) 25000 UT/250ML-% IV SOLN
1300.0000 [IU]/h | INTRAVENOUS | Status: DC
  Administered 2020-09-28: 1450 [IU]/h via INTRAVENOUS
  Administered 2020-09-29: 1300 [IU]/h via INTRAVENOUS
  Filled 2020-09-28 (×2): qty 250

## 2020-09-28 MED ORDER — HEPARIN (PORCINE) 25000 UT/250ML-% IV SOLN: 1250.0000 [IU]/h | INTRAVENOUS | Status: DC

## 2020-09-28 MED ORDER — ADULT MULTIVITAMIN W/MINERALS CH: 1.0000 | ORAL_TABLET | Freq: Every day | ORAL | Status: AC

## 2020-09-28 MED ORDER — ASCORBIC ACID 500 MG PO TABS: 500.0000 mg | ORAL_TABLET | Freq: Every day | ORAL | Status: AC

## 2020-09-28 MED ORDER — APIXABAN 5 MG PO TABS: 5.0000 mg | ORAL_TABLET | Freq: Two times a day (BID) | ORAL | Status: DC

## 2020-09-28 MED ORDER — APIXABAN 5 MG PO TABS
10.0000 mg | ORAL_TABLET | Freq: Two times a day (BID) | ORAL | Status: DC
  Filled 2020-09-28: qty 2

## 2020-09-28 MED ORDER — VITAMIN D 125 MCG (5000 UT) PO CAPS: 1.0000 | ORAL_CAPSULE | Freq: Every day | ORAL | 3 refills | Status: AC

## 2020-09-28 NOTE — Discharge Instructions (Signed)
Orthopaedic Trauma Service Discharge Instructions   General Discharge Instructions  Orthopaedic Injuries:  Left femur fracture treated with intramedullary nailing   WEIGHT BEARING STATUS: nonweightbearing left leg   RANGE OF MOTION/ACTIVITY: Unrestricted range of motion Left hip and knee   Bone health:  Continue with vitamin d supplementation   Wound Care: daily wound care starting 09/29/2020.  4x4 gauze and tape or mepliex type dressing.  Ok to leave wounds open to air if dry    Discharge Wound Care Instructions  Do NOT apply any ointments, solutions or lotions to pin sites or surgical wounds.  These prevent needed drainage and even though solutions like hydrogen peroxide kill bacteria, they also damage cells lining the pin sites that help fight infection.  Applying lotions or ointments can keep the wounds moist and can cause them to breakdown and open up as well. This can increase the risk for infection. When in doubt call the office.  Surgical incisions should be dressed daily.  If any drainage is noted, use one layer of adaptic, then gauze and tape   Once the incision is completely dry and without drainage, it may be left open to air out.  Showering may begin 36-48 hours later.  Cleaning gently with soap and water.   Diet: as you were eating previously.  Can use over the counter stool softeners and bowel preparations, such as Miralax, to help with bowel movements.  Narcotics can be constipating.  Be sure to drink plenty of fluids  PAIN MEDICATION USE AND EXPECTATIONS  You have likely been given narcotic medications to help control your pain.  After a traumatic event that results in an fracture (broken bone) with or without surgery, it is ok to use narcotic pain medications to help control one's pain.  We understand that everyone responds to pain differently and each individual patient will be evaluated on a regular basis for the continued need for narcotic medications. Ideally,  narcotic medication use should last no more than 6-8 weeks (coinciding with fracture healing).   As a patient it is your responsibility as well to monitor narcotic medication use and report the amount and frequency you use these medications when you come to your office visit.   We would also advise that if you are using narcotic medications, you should take a dose prior to therapy to maximize you participation.  IF YOU ARE ON NARCOTIC MEDICATIONS IT IS NOT PERMISSIBLE TO OPERATE A MOTOR VEHICLE (MOTORCYCLE/CAR/TRUCK/MOPED) OR HEAVY MACHINERY DO NOT MIX NARCOTICS WITH OTHER CNS (CENTRAL NERVOUS SYSTEM) DEPRESSANTS SUCH AS ALCOHOL   STOP SMOKING OR USING NICOTINE PRODUCTS!!!!  As discussed nicotine severely impairs your body's ability to heal surgical and traumatic wounds but also impairs bone healing.  Wounds and bone heal by forming microscopic blood vessels (angiogenesis) and nicotine is a vasoconstrictor (essentially, shrinks blood vessels).  Therefore, if vasoconstriction occurs to these microscopic blood vessels they essentially disappear and are unable to deliver necessary nutrients to the healing tissue.  This is one modifiable factor that you can do to dramatically increase your chances of healing your injury.    (This means no smoking, no nicotine gum, patches, etc)  DO NOT USE NONSTEROIDAL ANTI-INFLAMMATORY DRUGS (NSAID'S)  Using products such as Advil (ibuprofen), Aleve (naproxen), Motrin (ibuprofen) for additional pain control during fracture healing can delay and/or prevent the healing response.  If you would like to take over the counter (OTC) medication, Tylenol (acetaminophen) is ok.  However, some narcotic medications that are  given for pain control contain acetaminophen as well. Therefore, you should not exceed more than 4000 mg of tylenol in a day if you do not have liver disease.  Also note that there are may OTC medicines, such as cold medicines and allergy medicines that my contain  tylenol as well.  If you have any questions about medications and/or interactions please ask your doctor/PA or your pharmacist.      ICE AND ELEVATE INJURED/OPERATIVE EXTREMITY  Using ice and elevating the injured extremity above your heart can help with swelling and pain control.  Icing in a pulsatile fashion, such as 20 minutes on and 20 minutes off, can be followed.    Do not place ice directly on skin. Make sure there is a barrier between to skin and the ice pack.    Using frozen items such as frozen peas works well as the conform nicely to the are that needs to be iced.  USE AN ACE WRAP OR TED HOSE FOR SWELLING CONTROL  In addition to icing and elevation, Ace wraps or TED hose are used to help limit and resolve swelling.  It is recommended to use Ace wraps or TED hose until you are informed to stop.    When using Ace Wraps start the wrapping distally (farthest away from the body) and wrap proximally (closer to the body)   Example: If you had surgery on your leg or thing and you do not have a splint on, start the ace wrap at the toes and work your way up to the thigh        If you had surgery on your upper extremity and do not have a splint on, start the ace wrap at your fingers and work your way up to the upper arm  IF YOU ARE IN A SPLINT OR CAST DO NOT [place]   If your splint gets wet for any reason please contact the office immediately. You may shower in your splint or cast as long as you keep it dry.  This can be done by wrapping in a cast cover or garbage back (or similar)  Do Not stick any thing down your splint or cast such as pencils, money, or hangers to try and scratch yourself with.  If you feel itchy take benadryl as prescribed on the bottle for itching  IF YOU ARE IN A CAM BOOT (BLACK BOOT)  You may remove boot periodically. Perform daily dressing changes as noted below.  Wash the liner of the boot regularly and wear a sock when wearing the boot. It is  recommended that you sleep in the boot until told otherwise    Call office for the following:  Temperature greater than 101F  Persistent nausea and vomiting  Severe uncontrolled pain  Redness, tenderness, or signs of infection (pain, swelling, redness, odor or green/yellow discharge around the site)  Difficulty breathing, headache or visual disturbances  Hives  Persistent dizziness or light-headedness  Extreme fatigue  Any other questions or concerns you may have after discharge  In an emergency, call 911 or go to an Emergency Department at a nearby hospital  HELPFUL INFORMATION  ? If you had a block, it will wear off between 8-24 hrs postop typically.  This is period when your pain may go from nearly zero to the pain you would have had postop without the block.  This is an abrupt transition but nothing dangerous is happening.  You may take an extra dose  of narcotic when this happens.  ? You should wean off your narcotic medicines as soon as you are able.  Most patients will be off or using minimal narcotics before their first postop appointment.   ? We suggest you use the pain medication the first night prior to going to bed, in order to ease any pain when the anesthesia wears off. You should avoid taking pain medications on an empty stomach as it will make you nauseous.  ? Do not drink alcoholic beverages or take illicit drugs when taking pain medications.  ? In most states it is against the law to drive while you are in a splint or sling.  And certainly against the law to drive while taking narcotics.  ? You may return to work/school in the next couple of days when you feel up to it.   ? Pain medication may make you constipated.  Below are a few solutions to try in this order: - Decrease the amount of pain medication if you aren't having pain. - Drink lots of decaffeinated fluids. - Drink prune juice and/or each dried prunes  o If the first 3 don't work start with  additional solutions - Take Colace - an over-the-counter stool softener - Take Senokot - an over-the-counter laxative - Take Miralax - a stronger over-the-counter laxative     CALL THE OFFICE WITH ANY QUESTIONS OR CONCERNS: [phone]   VISIT OUR WEBSITE FOR ADDITIONAL INFORMATION: [email or web address]

## 2020-09-28 NOTE — Progress Notes (Signed)
  Echocardiogram 2D Echocardiogram has been performed.  Pieter Partridge 09/28/2020, 10:35 AM

## 2020-09-28 NOTE — TOC CAGE-AID Note (Signed)
Transition of Care [facility]) - CAGE-AID Screening   Patient Details  Name: Deanna Strong MRN: [id number] Date of Birth: 1934/10/25  [facility] CM/SW Contact:    Jimmy Picket, LCSWA Phone Number: 09/28/2020, 11:06 AM   Clinical Narrative:  Pt is unable to participate in assessment due to being oriented to self.   CAGE-AID Screening: Substance Abuse Screening unable to be completed due to: : Patient unable to participate            Isabella Stalling Clinical Social Worker [phone]

## 2020-09-28 NOTE — Progress Notes (Addendum)
Occupational Therapy Treatment Patient Details Name: Deanna Strong MRN: [id number] DOB: [birth year] Today's Date: 09/28/2020    History of present illness Deanna Strong is a 86 y.o. female with history of dementia with psychosis was brought to the ER after patient had a ground-level fall. X-rays reveal distal L femur fx. Pt underwent IMN of L LE on 09/25/20.   OT comments  Pt with much improved cognition and communication today, able to converse throughout with therapist, recall WB precautions (though with difficulty implementing them during activity). Pt able to demonstrate ability to complete self feeding and perform grooming tasks seated without physical assist. Pt overall Mod A for bed mobility, Max A for sit to stand with RW though unable to hop or sequence R LE for pivot. Pt transferred to chair with manual therapist assist at Max A x 1. For toileting tasks, pt will need Max A x 2 (one person to assist with balance and one for hygiene). Began education on compensatory strategies for LB ADLs with plans to practice during next session. Continue to recommend SNF for short term rehab.   Follow Up Recommendations  SNF;Supervision/Assistance - 24 hour    Equipment Recommendations  3 in 1 bedside commode;Other (comment);Wheelchair (measurements OT);Wheelchair cushion (measurements OT) (RW)    Recommendations for Other Services      Precautions / Restrictions Precautions Precautions: Fall Restrictions Weight Bearing Restrictions: Yes LLE Weight Bearing: Non weight bearing       Mobility Bed Mobility Overal bed mobility: Needs Assistance Bed Mobility: Supine to Sit     Supine to sit: Mod assist;HOB elevated     General bed mobility comments: Mod A for bed mobility to sit EOB, light assist for advancing L LE to EOB and heavy assist to lift trunk  Transfers Overall transfer level: Needs assistance Equipment used: Rolling walker (2 wheeled);None Transfers: Sit to/from [facility] Sit to Stand: Max assist   Squat pivot transfers: Max assist     General transfer comment: Trialed sit to stands with RW at Max A x 1, with each trial able to progress to maintaining NWB but unable to hop or sequence R LE for pivot to chair with RW. Assisted with manual squat pivot to chair at Max A x 1    Balance Overall balance assessment: Needs assistance;History of Falls Sitting-balance support: Feet supported;Bilateral upper extremity supported Sitting balance-Leahy Scale: Fair     Standing balance support: Bilateral upper extremity supported;During functional activity Standing balance-Leahy Scale: Poor Standing balance comment: reliant on UE support and external support                           ADL either performed or assessed with clinical judgement   ADL Overall ADL's : Needs assistance/impaired Eating/Feeding: Set up;Sitting Eating/Feeding Details (indicate cue type and reason): Setup to drink from cup, eat snacks. Assistance to open containers Grooming: Supervision/safety;Sitting;Brushing hair Grooming Details (indicate cue type and reason): Supervision with cues for thoroughness and attention             Lower Body Dressing: Maximal assistance;Sit to/from stand;Sitting/lateral leans Lower Body Dressing Details (indicate cue type and reason): Able to bring R LE up to self to reach socks, unable to reach L foot at this time. Educated on safety techniques for dressing, sitting during task Toilet Transfer: Maximal assistance;Squat-pivot Toilet Transfer Details (indicate cue type and reason): simulated to recliner           General  ADL Comments: Much improved cognition and communication, continues with balance deciits and R shoulder weakness     Vision   Vision Assessment?: No apparent visual deficits   Perception     Praxis      Cognition Arousal/Alertness: Awake/alert Behavior During Therapy: WFL for tasks  assessed/performed Overall Cognitive Status: History of cognitive impairments - at baseline Area of Impairment: Orientation;Attention;Memory;Following commands;Safety/judgement;Awareness;Problem solving                 Orientation Level: Situation Current Attention Level: Selective Memory: Decreased short-term memory Following Commands: Follows one step commands with increased time Safety/Judgement: Decreased awareness of safety Awareness: Emergent Problem Solving: Difficulty sequencing;Requires verbal cues;Requires tactile cues General Comments: Pt cognition much improved, conversive throughout session, able to recall precautions but difficulty implementing during activity. Some tangential conversation. Does require frequent cues for safety techniques        Exercises     Shoulder Instructions       General Comments Pt much more conversive today, improved cognition and following of safety directions. Pt unable to recall events on Wednesday (CVA symptoms during session)    Pertinent Vitals/ Pain       Pain Assessment: No/denies pain  Home Living                                          Prior Functioning/Environment              Frequency  Min 2X/week        Progress Toward Goals  OT Goals(current goals can now be found in the care plan section)  Progress towards OT goals: Progressing toward goals  Acute Rehab OT Goals Patient Stated Goal: improve R UE strength, return to independence OT Goal Formulation: With patient Time For Goal Achievement: 10/10/20 Potential to Achieve Goals: Good ADL Goals Pt Will Perform Eating: with modified independence;sitting Pt Will Perform Grooming: with modified independence;sitting Pt Will Perform Upper Body Bathing: with supervision;sitting Pt Will Perform Upper Body Dressing: with supervision;sitting Pt Will Transfer to Toilet: with min assist;stand pivot transfer;bedside commode Additional ADL Goal  #1: Pt to demonstrate ability to follow one step commands 75% of the time to improve ADL participation  Plan Discharge plan remains appropriate    Co-evaluation                 AM-PAC OT "6 Clicks" Daily Activity     Outcome Measure   Help from another person eating meals?: A Little Help from another person taking care of personal grooming?: A Little Help from another person toileting, which includes using toliet, bedpan, or urinal?: A Lot Help from another person bathing (including washing, rinsing, drying)?: A Lot Help from another person to put on and taking off regular upper body clothing?: A Little Help from another person to put on and taking off regular lower body clothing?: A Lot 6 Click Score: 15    End of Session Equipment Utilized During Treatment: Gait belt;Rolling walker  OT Visit Diagnosis: Unsteadiness on feet (R26.81);Other abnormalities of gait and mobility (R26.89);Muscle weakness (generalized) (M62.81);History of falling (Z91.81);Other symptoms and signs involving cognitive function;Pain   Activity Tolerance Patient tolerated treatment well   Patient Left in chair;with call bell/phone within reach;with chair alarm set;Other (comment) (fall mats in place)   Nurse Communication Mobility status;Weight bearing status        Time: 0802-0850 OT  Time Calculation (min): 48 min  Charges: OT General Charges $OT Visit: 1 Visit OT Treatments $Self Care/Home Management : 23-37 mins $Therapeutic Activity: 8-22 mins  Lorre Munroe, OTR/L   Lorre Munroe 09/28/2020, 9:48 AM

## 2020-09-28 NOTE — Progress Notes (Signed)
Bilateral lower extremity venous duplex has been completed. Preliminary results can be found in CV Proc through chart review.  Results were given to the patient's nurse, Elease Hashimoto.  09/28/20 5:15 PM Olen Cordial RVT

## 2020-09-28 NOTE — TOC Benefit Eligibility Note (Signed)
Transition of Care [facility]) Benefit Eligibility Note    Patient Details  Name: Deanna Strong MRN: [id number] Date of Birth: 1934/07/14   Medication/Dose: ELIQUIS  5 MG BID  Covered?: Yes  Tier: 3 Drug  Prescription Coverage [facility]  Spoke with Person/Company/Phone Number:: TIFFANY  @ [facility] [employer] # [phone] OPT- MEMBER  Co-Pay: $429.96  Prior Approval: No  Deductible: Unmet (DEDUCTIBLE $ 382.96=47.00= $429.96 and OUT-OF-POCKET:UNMET)  Additional Notes: APIXABAN : NON-FORMULARY    Mardene Sayer Phone Number: 09/28/2020, 1:22 PM

## 2020-09-28 NOTE — NC FL2 (Signed)
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, [address] [place], [place] [address]      Provider Number: [id number]  Attending Physician Name and Address:  Ollen Bowl, MD  Relative Name and Phone Number:  Nakina Spatz - daughter - [phone]    Current Level of Care: Hospital Recommended Level of Care: Skilled Nursing Facility Prior Approval Number:    Date Approved/Denied:   PASRR Number: pending PASRR  Discharge Plan: SNF    Current Diagnoses: Patient Active Problem List   Diagnosis Date Noted  . Femur fracture (HCC) 09/24/2020  . Femur fracture, left (HCC) 09/24/2020  . Dementia (HCC)   . Palliative care encounter 07/30/2018  . Psychosis (HCC) 06/01/2018    Orientation RESPIRATION BLADDER Height & Weight     Self  Normal Continent Weight: 52.3 kg Height:  5\' 2"  (157.5 cm) (from CareEverywhere encounter 07/2818)  BEHAVIORAL SYMPTOMS/MOOD NEUROLOGICAL BOWEL NUTRITION STATUS      Continent Diet (See discharge summary)  AMBULATORY STATUS COMMUNICATION OF NEEDS Skin   Extensive Assist Verbally Surgical wounds                       Personal Care Assistance Level of Assistance  Bathing, Feeding, Dressing Bathing Assistance: Maximum assistance Feeding assistance: Independent Dressing Assistance: Maximum assistance     Functional Limitations Info  Sight, Hearing, Speech Sight Info: Impaired Hearing Info: Adequate Speech Info: Adequate    SPECIAL CARE FACTORS FREQUENCY  PT (By licensed PT), OT (By licensed OT)     PT Frequency: 5 x per week OT Frequency: 5 x per week            Contractures Contractures Info: Not present    Additional Factors Info  Code Status, Allergies, Psychotropic Code Status  Info: DNR Allergies Info: NKDA Psychotropic Info: Haldol         Current Medications (09/28/2020):  This is the current hospital active medication list Current Facility-Administered Medications  Medication Dose Route Frequency Provider Last Rate Last Admin  . 0.9 %  sodium chloride infusion (Manually program via Guardrails IV Fluids)   Intravenous Once 09/30/2020, PA-C      . acetaminophen (TYLENOL) tablet 325-650 mg  325-650 mg Oral Q6H PRN Montez Morita, PA-C      . acetaminophen (TYLENOL) tablet 500 mg  500 mg Oral Q8H Montez Morita, PA-C      . acetaminophen (TYLENOL) tablet 650 mg  650 mg Oral Once Montez Morita, PA-C      . ascorbic acid (VITAMIN C) tablet 500 mg  500 mg Oral Daily Montez Morita, PA-C      . aspirin EC tablet 81 mg  81 mg Oral Daily Kirby-Graham, Montez Morita, NP      . atorvastatin (LIPITOR) tablet 40 mg  40 mg Oral Daily Pahwani, Rinka R, MD      . Chlorhexidine Gluconate Cloth 2 % PADS 6 each  6 each Topical Daily Beather Arbour, MD   6 each at 09/28/20 1221  . cholecalciferol (VITAMIN D3) tablet 2,000 Units  2,000 Units Oral BID 09/30/20, PA-C   2,000 Units at 09/27/20 2215  . docusate sodium (COLACE) capsule 100 mg  100 mg Oral BID 2216, PA-C  100 mg at 09/27/20 2215  . haloperidol lactate (HALDOL) injection 2 mg  2 mg Intravenous Q6H PRN Montez Morita, PA-C   2 mg at 09/27/20 1623  . heparin ADULT infusion 100 units/mL (25000 units/220mL sodium chloride 0.45%)  1,250 Units/hr Intravenous Continuous [place]wyk, Yujing Z, RPH 12.5 mL/hr at 09/28/20 1230 1,250 Units/hr at 09/28/20 1230  . HYDROcodone-acetaminophen (NORCO/VICODIN) 5-325 MG per tablet 1 tablet  1 tablet Oral Q4H PRN Montez Morita, PA-C      . iohexol (OMNIPAQUE) 350 MG/ML injection 100 mL  100 mL Intravenous Once PRN Altamese Dilling M, RN      . menthol-cetylpyridinium (CEPACOL) lozenge 3 mg  1 lozenge Oral PRN Montez Morita, PA-C      . metoCLOPramide (REGLAN) tablet 5-10 mg  5-10 mg Oral Q8H PRN Montez Morita, PA-C       Or  . metoCLOPramide (REGLAN) injection 5-10 mg  5-10 mg Intravenous Q8H PRN Montez Morita, PA-C      . morphine 2 MG/ML injection 0.5-1 mg  0.5-1 mg Intravenous Q2H PRN Montez Morita, PA-C      . multivitamin with minerals tablet 1 tablet  1 tablet Oral Daily Montez Morita, PA-C   1 tablet at 09/25/20 1739  . ondansetron (ZOFRAN) tablet 4 mg  4 mg Oral Q6H PRN Montez Morita, PA-C       Or  . ondansetron [facility]) injection 4 mg  4 mg Intravenous Q6H PRN Montez Morita, PA-C      . Vitamin D (Ergocalciferol) (DRISDOL) capsule 50,000 Units  50,000 Units Oral Q7 days Pahwani, Kasandra Knudsen, MD         Discharge Medications: Please see discharge summary for a list of discharge medications.  Relevant Imaging Results:  Relevant Lab Results:   Additional Information SS# [id number]  Janae Bridgeman, RN

## 2020-09-28 NOTE — Progress Notes (Signed)
PROGRESS NOTE    Deanna Strong  QIO:[id number] DOB: 02-04-1934 DOA: 09/24/2020 PCP: [name]   Brief Narrative:  Patient is 86 year old female with past medical history of dementia, behavioral disturbances, chronic anemia, depression presents with complaint of fall found to have distal left femur fracture.  Underwent ORIF on 09/25/2020.  Assessment & Plan:  Distal left femur fracture: Status post fall.  Status post ORIF intramedullary femoral nailing on 11/23 by orthopedic surgery. -Profound osteoporosis Intra-Op operatively noted. -Continue pain medication.  Continue to monitor H&H closely. -PT recommended SNF  Acute right sided PE: -Reviewed CTA chest -Started patient on full dose heparin monitor H&H closely.   -Transitioned to p.o. Eliquis however patient has been refusing p.o. medications.  Continue heparin for now.  Reviewed transthoracic echo which showed preserved ejection fraction with grade 1 diastolic dysfunction.  Doppler ultrasound of bilateral lower extremity still pending.  TIA versus transient worsening of underlying dementia: -Code stroke called on 11/24.  -Neurology consulted. -Underwent CT head, CTA head and neck, CT perfusion study which came back negative for acute stroke. -Appreciated neurology help-recommended no need to repeat CT to confirm stroke. -Neurology signed off -A1c, TSH: WNL, LDL: 95.   -Continue aspirin, statin.-Evaluated by PT/OT/SLP  Acute blood loss anemia: H&H dropped from 9.7-7.9-7.0 -Status post 1 unit blood transfusion on 11/25 -Monitor H&H closely and transfuse if hemoglobin is less than 7.  History of dementia/depression/psychosis: -Resides in ALF.  Independent on daily life activities. -cont. Supportive care  Osteoporosis/vitamin D deficiency: -Continue with vitamin D supplements.  Thrombocytopenia: No signs of active bleeding noted.  Could be heparin-induced.  Continue to monitor.  Resolved.  DVT  prophylaxis: Full dose heparin Code Status: DNR Family Communication:  None present at bedside.  Plan of care discussed with patient in length and he verbalized understanding and agreed with it.  Disposition Plan: SNF  Consultants:   Orthopedic surgery  Neurology  Procedures:   ORIF intramedullary femoral nailing  CT head  CTA head and neck  Antimicrobials:   None  Status is: Inpatient  Dispo: The patient is from: ALF              Anticipated d/c is to: SNF              Anticipated d/c date is: 2 days              Patient currently is not medically stable to d/c.  Subjective: Patient seen and examined.  Pleasantly demented.  She denies new complaints including pain in her left hip.  Denies chest pain, shortness of breath, palpitation, nausea or vomiting   objective: Vitals:   09/27/20 2323 09/27/20 2348 09/28/20 0220 09/28/20 0751  BP: 116/64 116/62 128/78 121/71  Pulse: 91 91 85 82  Resp: 16 16  17   Temp: 98.6 F (37 C) 98.6 F (37 C) 99 F (37.2 C) 98.4 F (36.9 C)  TempSrc: Oral Oral Oral Oral  SpO2: 94% 93%  94%  Weight:      Height:       No intake or output data in the 24 hours ending 09/28/20 1329 Filed Weights   09/25/20 0232 09/25/20 0624  Weight: 52.3 kg 52.3 kg    Examination: General exam: Appears calm and comfortable on room air, communicating well Respiratory system: Clear to auscultation. Respiratory effort normal. Cardiovascular system: S1 & S2 heard, RRR. No JVD, murmurs, rubs, gallops or clicks. No pedal edema. Gastrointestinal system: Abdomen is nondistended, soft and  nontender. No organomegaly or masses felt. Normal bowel sounds heard. Central nervous system: Alert and oriented Extremities: Symmetric 5 x 5 power. Skin: Sutures noted in left knee and upper thigh.  No signs of active bleeding noted.     Data Reviewed: I have personally reviewed following labs and imaging studies  CBC: Recent Labs  Lab 09/26/20 0612  09/26/20 1541 09/27/20 0021 09/27/20 1150 09/28/20 0459  WBC 9.9 9.2 6.6 6.5 5.7  HGB 7.9* 7.7* 7.0* 7.3* 7.9*  HCT 25.1* 24.7* 22.8* 23.4* 24.9*  MCV 83.4 84.9 84.8 85.7 87.1  PLT 175 183 146* 163 179   Basic Metabolic Panel: Recent Labs  Lab 09/24/20 1840 09/25/20 1554 09/27/20 0838 09/28/20 0459  NA 135  --  134* 136  K 3.6  --  3.8 3.9  CL 101  --  102 102  CO2 23  --  23 24  GLUCOSE 161*  --  130* 101*  BUN 7*  --  9 9  CREATININE 0.48 0.74 0.57 0.57  CALCIUM 8.6*  --  7.9* 8.3*   GFR: Estimated Creatinine Clearance: 41.4 mL/min (by C-G formula based on SCr of 0.57 mg/dL). Liver Function Tests: Recent Labs  Lab 09/24/20 1840  AST 23  ALT 16  ALKPHOS 78  BILITOT 0.9  PROT 6.3*  ALBUMIN 3.9   No results for input(s): LIPASE, AMYLASE in the last 168 hours. No results for input(s): AMMONIA in the last 168 hours. Coagulation Profile: No results for input(s): INR, PROTIME in the last 168 hours. Cardiac Enzymes: No results for input(s): CKTOTAL, CKMB, CKMBINDEX, TROPONINI in the last 168 hours. BNP (last 3 results) No results for input(s): PROBNP in the last 8760 hours. HbA1C: Recent Labs    09/27/20 0021  HGBA1C 5.4   CBG: Recent Labs  Lab 09/26/20 1846 09/27/20 1651 09/28/20 0001 09/28/20 0642 09/28/20 1208  GLUCAP 107* 109* 108* 112* 96   Lipid Profile: Recent Labs    09/27/20 0021  CHOL 162  HDL 52  LDLCALC 95  TRIG 73  CHOLHDL 3.1   Thyroid Function Tests: Recent Labs    09/26/20 1541  TSH 0.285*   Anemia Panel: No results for input(s): VITAMINB12, FOLATE, FERRITIN, TIBC, IRON, RETICCTPCT in the last 72 hours. Sepsis Labs: No results for input(s): PROCALCITON, LATICACIDVEN in the last 168 hours.  Recent Results (from the past 240 hour(s))  Resp Panel by RT-PCR (Flu A&B, Covid) Nasopharyngeal Swab     Status: None   Collection Time: 09/24/20  6:40 PM   Specimen: Nasopharyngeal Swab; Nasopharyngeal(NP) swabs in vial transport  medium  Result Value Ref Range Status   SARS Coronavirus 2 by RT PCR NEGATIVE NEGATIVE Final    Comment: (NOTE) SARS-CoV-2 target nucleic acids are NOT DETECTED.  The SARS-CoV-2 RNA is generally detectable in upper respiratory specimens during the acute phase of infection. The lowest concentration of SARS-CoV-2 viral copies this assay can detect is 138 copies/mL. A negative result does not preclude SARS-Cov-2 infection and should not be used as the sole basis for treatment or other patient management decisions. A negative result may occur with  improper specimen collection/handling, submission of specimen other than nasopharyngeal swab, presence of viral mutation(s) within the areas targeted by this assay, and inadequate number of viral copies(<138 copies/mL). A negative result must be combined with clinical observations, patient history, and epidemiological information. The expected result is Negative.  Fact Sheet for Patients:  BloggerCourse.comhttps://www.fda.gov/media/152166/download  Fact Sheet for Healthcare Providers:  SeriousBroker.ithttps://www.fda.gov/media/152162/download  This  test is no t yet approved or cleared by the [place] and  has been authorized for detection and/or diagnosis of SARS-CoV-2 by FDA under an Emergency Use Authorization (EUA). This EUA will remain  in effect (meaning this test can be used) for the duration of the COVID-19 declaration under Section 564(b)(1) of the Act, 21 U.S.C.section 360bbb-3(b)(1), unless the authorization is terminated  or revoked sooner.       Influenza A by PCR NEGATIVE NEGATIVE Final   Influenza B by PCR NEGATIVE NEGATIVE Final    Comment: (NOTE) The Xpert Xpress SARS-CoV-2/FLU/RSV plus assay is intended as an aid in the diagnosis of influenza from Nasopharyngeal swab specimens and should not be used as a sole basis for treatment. Nasal washings and aspirates are unacceptable for Xpert Xpress SARS-CoV-2/FLU/RSV testing.  Fact Sheet for  Patients: BloggerCourse.com  Fact Sheet for Healthcare Providers: SeriousBroker.it  This test is not yet approved or cleared by the [place] FDA and has been authorized for detection and/or diagnosis of SARS-CoV-2 by FDA under an Emergency Use Authorization (EUA). This EUA will remain in effect (meaning this test can be used) for the duration of the COVID-19 declaration under Section 564(b)(1) of the Act, 21 U.S.C. section 360bbb-3(b)(1), unless the authorization is terminated or revoked.  Performed at [facility], [address] [place], [place] [address]       Radiology Studies: CT ANGIO CHEST PE W OR WO CONTRAST  Result Date: 09/26/2020 CLINICAL DATA:  Postoperative hypoxia. EXAM: CT ANGIOGRAPHY CHEST WITH CONTRAST TECHNIQUE: Multidetector CT imaging of the chest was performed using the standard protocol during bolus administration of intravenous contrast. Multiplanar CT image reconstructions and MIPs were obtained to evaluate the vascular anatomy. CONTRAST:  38mL OMNIPAQUE IOHEXOL 350 MG/ML SOLN COMPARISON:  None. FINDINGS: Cardiovascular: There is mild to moderate severity calcification of the aortic arch. A mild amount of intraluminal low attenuation is seen involving anteromedial right upper lobe and posteromedial right lower lobe branches of the right pulmonary artery. Normal heart size without evidence of heart strain. No pericardial effusion. Mediastinum/Nodes: No enlarged mediastinal, hilar, or axillary lymph nodes. Thyroid gland, trachea, and esophagus demonstrate no significant findings. Lungs/Pleura: Mild to moderate severity linear scarring and/or atelectasis is seen within the bilateral lower lobes, right greater than left. There is no evidence of a pleural effusion or pneumothorax. Upper Abdomen: Multiple focal areas of low attenuation are seen scattered throughout the liver. The largest measures  approximately 2.4 cm x 1.4 cm and is seen within the anteromedial aspect of the right lobe. Musculoskeletal: Chronic compression fracture deformities are seen involving the T6, T8, T12 and L1 vertebral bodies. Review of the MIP images confirms the above findings. IMPRESSION: 1. Mild pulmonary embolism involving right upper lobe and right lower lobe branches of the right pulmonary artery. 2. Mild to moderate severity bilateral lower lobe linear scarring and/or atelectasis. 3. Multiple low-attenuation liver lesions which are nonspecific and may represent multiple hemangiomas. Due to the size of these lesions (greater than 1.5 cm) correlation with MRI of the abdomen is recommended. 4. Chronic compression fracture deformities involving the T6, T8, T12 and L1 vertebral bodies. 5. Aortic atherosclerosis. Aortic Atherosclerosis (ICD10-I70.0). Electronically Signed   By: Aram Candela M.D.   On: 09/26/2020 16:29   ECHOCARDIOGRAM COMPLETE  Result Date: 09/28/2020    ECHOCARDIOGRAM REPORT   Patient Name:   ANYSHA FRAPPIER Date of Exam: 09/28/2020 Medical Rec #:  [id number]    Height:  62.0 in Accession #:    [id number]   Weight:       115.3 lb Date of Birth:  01-Nov-1934    BSA:          1.513 m Patient Age:    86 years     BP:           121/71 mmHg Patient Gender: F            HR:           82 bpm. Exam Location:  Inpatient Procedure: 2D Echo, Cardiac Doppler and Color Doppler Indications:    Pulmonary embolus  History:        Patient has no prior history of Echocardiogram examinations.                 TIA. Dementia, psychosis, anemia.  Sonographer:    Lavenia Atlas Referring Phys: [id number] Kasandra Knudsen Moe Brier IMPRESSIONS  1. Left ventricular ejection fraction, by estimation, is 60 to 65%. The left ventricle has normal function. The left ventricle has no regional wall motion abnormalities. Left ventricular diastolic parameters are consistent with Grade I diastolic dysfunction (impaired relaxation).  2. Right  ventricular systolic function is normal. The right ventricular size is normal. There is mildly elevated pulmonary artery systolic pressure. The estimated right ventricular systolic pressure is 39.5 mmHg.  3. The mitral valve is normal in structure. No evidence of mitral valve regurgitation. No evidence of mitral stenosis.  4. The aortic valve is normal in structure. Aortic valve regurgitation is not visualized. No aortic stenosis is present.  5. The inferior vena cava is normal in size with greater than 50% respiratory variability, suggesting right atrial pressure of 3 mmHg. FINDINGS  Left Ventricle: Left ventricular ejection fraction, by estimation, is 60 to 65%. The left ventricle has normal function. The left ventricle has no regional wall motion abnormalities. The left ventricular internal cavity size was normal in size. There is  no left ventricular hypertrophy. Left ventricular diastolic parameters are consistent with Grade I diastolic dysfunction (impaired relaxation). Indeterminate filling pressures. Right Ventricle: The right ventricular size is normal. No increase in right ventricular wall thickness. Right ventricular systolic function is normal. There is mildly elevated pulmonary artery systolic pressure. The tricuspid regurgitant velocity is 3.02  m/s, and with an assumed right atrial pressure of 3 mmHg, the estimated right ventricular systolic pressure is 39.5 mmHg. Left Atrium: Left atrial size was normal in size. Right Atrium: Right atrial size was normal in size. Pericardium: There is no evidence of pericardial effusion. Mitral Valve: The mitral valve is normal in structure. No evidence of mitral valve regurgitation. No evidence of mitral valve stenosis. Tricuspid Valve: The tricuspid valve is normal in structure. Tricuspid valve regurgitation is mild . No evidence of tricuspid stenosis. Aortic Valve: The aortic valve is normal in structure. Aortic valve regurgitation is not visualized. No aortic  stenosis is present. Pulmonic Valve: The pulmonic valve was normal in structure. Pulmonic valve regurgitation is not visualized. No evidence of pulmonic stenosis. Aorta: The aortic root is normal in size and structure. Venous: The inferior vena cava is normal in size with greater than 50% respiratory variability, suggesting right atrial pressure of 3 mmHg. IAS/Shunts: No atrial level shunt detected by color flow Doppler.  LEFT VENTRICLE PLAX 2D LVIDd:         4.60 cm  Diastology LVIDs:         2.70 cm  LV e' medial:    5.98 cm/s  LV PW:         1.10 cm  LV E/e' medial:  12.4 LV IVS:        1.00 cm  LV e' lateral:   5.66 cm/s LVOT diam:     2.10 cm  LV E/e' lateral: 13.1 LV SV:         63 LV SV Index:   41 LVOT Area:     3.46 cm  RIGHT VENTRICLE RV Basal diam:  3.20 cm RV S prime:     14.00 cm/s TAPSE (M-mode): 2.6 cm LEFT ATRIUM             Index       RIGHT ATRIUM           Index LA diam:        3.40 cm 2.25 cm/m  RA Area:     15.60 cm LA Vol (A2C):   32.6 ml 21.55 ml/m RA Volume:   46.30 ml  30.61 ml/m LA Vol (A4C):   26.8 ml 17.72 ml/m LA Biplane Vol: 31.5 ml 20.82 ml/m  AORTIC VALVE LVOT Vmax:   108.00 cm/s LVOT Vmean:  54.900 cm/s LVOT VTI:    0.181 m  AORTA Ao Root diam: 3.00 cm MITRAL VALVE               TRICUSPID VALVE MV Area (PHT): 3.91 cm    TR Peak grad:   36.5 mmHg MV Decel Time: 194 msec    TR Vmax:        302.00 cm/s MV E velocity: 74.10 cm/s MV A velocity: 86.10 cm/s  SHUNTS MV E/A ratio:  0.86        Systemic VTI:  0.18 m                            Systemic Diam: 2.10 cm Rachelle Hora Croitoru MD Electronically signed by Thurmon Fair MD Signature Date/Time: 09/28/2020/12:15:06 PM    Final     Scheduled Meds: . sodium chloride   Intravenous Once  . acetaminophen  500 mg Oral Q8H  . acetaminophen  650 mg Oral Once  . vitamin C  500 mg Oral Daily  . aspirin EC  81 mg Oral Daily  . atorvastatin  40 mg Oral Daily  . Chlorhexidine Gluconate Cloth  6 each Topical Daily  . cholecalciferol  2,000  Units Oral BID  . docusate sodium  100 mg Oral BID  . multivitamin with minerals  1 tablet Oral Daily  . Vitamin D (Ergocalciferol)  50,000 Units Oral Q7 days   Continuous Infusions: . heparin 1,250 Units/hr (09/28/20 1230)     LOS: 4 days   Time spent: 45 minutes  Kohen Reither Estill Cotta, MD [facility]  If 7PM-7AM, please contact night-coverage [email or web address] 09/28/2020, 1:29 PM

## 2020-09-28 NOTE — TOC Progression Note (Signed)
Transition of Care [facility]) - Progression Note    Patient Details  Name: Deanna Strong MRN: [id number] Date of Birth: [birth year]  Transition of Care [facility]) CM/SW Contact  Janae Bridgeman, RN Phone Number: 09/28/2020, 1:48 PM  Clinical Narrative:    Case management spoke with the patient's daughter, Okey Regal, regarding patient need for [facility] placement once patient is medically clear and she verbalized understanding of need for short term rehab for the patient.  The patient would not be able to return back to [facility] due to the nursing level of care.  I applied for the PASARR number and uploaded clinicals to the PASARR website in the meantime and will need to have this approved prior to SNF placement.  I started the SNF process for the patient and faxed the patient out to facilities in [place] and [place] area.  Medicare choice will have to be given to the daughters once patient is nearer to discharge to the facility.  The patient is fully vaccinated.  Will continue to follow for SNF placement once medically stable.   Expected Discharge Plan: Skilled Nursing Facility Barriers to Discharge: Continued Medical Work up  Expected Discharge Plan and Services Expected Discharge Plan: Skilled Nursing Facility   Discharge Planning Services: CM Consult   Living arrangements for the past 2 months: Assisted Living Facility                                       Social Determinants of Health (SDOH) Interventions    Readmission Risk Interventions Readmission Risk Prevention Plan 09/25/2020  Transportation Screening Complete  PCP or Specialist Appt within 5-7 Days Complete  Home Care Screening Complete  Medication Review (RN CM) Complete  Some recent data might be hidden

## 2020-09-28 NOTE — Progress Notes (Signed)
ANTICOAGULATION CONSULT NOTE - Follow Up Consult  Pharmacy Consult for heparin Indication: pulmonary embolus  Labs: Recent Labs    09/25/20 1554 09/26/20 0612 09/27/20 0021 09/27/20 0021 09/27/20 0838 09/27/20 1150 09/27/20 1810 09/28/20 0459  HGB 9.7*   < > 7.0*   < >  --  7.3*  --  7.9*  HCT 30.5*   < > 22.8*  --   --  23.4*  --  24.9*  PLT 218   < > 146*  --   --  163  --  179  HEPARINUNFRC  --   --  0.14*   < > 0.24*  --  0.23* 0.48  CREATININE 0.74  --   --   --  0.57  --   --   --    < > = values in this interval not displayed.    Assessment/Plan:  86yo female therapeutic on heparin after rate change. Will continue gtt at current rate and confirm stable with additional level.   Vernard Gambles, PharmD, BCPS  09/28/2020,5:48 AM

## 2020-09-28 NOTE — Care Management (Signed)
RE:  Deanna Strong Date of birth:  [birth year] Date:  09/28/2020  To whom it may concern,  Please be advised that the above-named patient will require a short-term nursing home stay - anticipated 30 days or less for rehabilitation and strengthening.  The plan is for return home.

## 2020-09-28 NOTE — Progress Notes (Signed)
Physical Therapy Treatment Patient Details Name: Deanna Strong MRN: [id number] DOB: November 08, 1933 Today's Date: 09/28/2020    History of Present Illness Deanna Strong is a 86 y.o. female with history of dementia with psychosis was brought to the ER after patient had a ground-level fall. X-rays reveal distal L femur fx. Pt underwent IMN of L LE on 09/25/20.    PT Comments    Pt supine on arrival, agreeable to therapy session and with good participation and fair tolerance for mobility. Pt performed posterior supine scoot with bed in trendelenburg with +28maxA, deferred EOB transfer due to arrival of dinner. Pt performed supine/bed in seated position BLE A/AAROM therapeutic exercises with good tolerance, needing verbal/tactile cues for technique and reps. Pt heels floated at end of session and bed alarm on for safety. Pt continues to benefit from skilled rehab in a post acute setting to maximize functional gains before returning home.   Follow Up Recommendations  SNF     Equipment Recommendations  Wheelchair cushion (measurements PT);Rolling walker with 5" wheels;Wheelchair (measurements PT);3in1 (PT)    Recommendations for Other Services       Precautions / Restrictions Precautions Precautions: Fall Restrictions Weight Bearing Restrictions: Yes LLE Weight Bearing: Non weight bearing    Mobility  Bed Mobility Overal bed mobility: Needs Assistance Bed Mobility:  (Posterior supine scoot)           General bed mobility comments: +33maxA for posterior supine scoot with bed in trendelenburg and pulling with LUE on bed rail, transfer pad assist on R side  Transfers                 General transfer comment: UTA, pt food tray arriving pt defer at this time, wanting to eat  Ambulation/Gait                 Stairs             Wheelchair Mobility    Modified Rankin (Stroke Patients Only)       Balance                                             Cognition Arousal/Alertness: Awake/alert Behavior During Therapy: WFL for tasks assessed/performed Overall Cognitive Status: History of cognitive impairments - at baseline Area of Impairment: Orientation;Attention;Memory;Following commands;Safety/judgement;Awareness;Problem solving                 Orientation Level: Situation Current Attention Level: Selective Memory: Decreased short-term memory Following Commands: Follows one step commands with increased time Safety/Judgement: Decreased awareness of safety Awareness: Emergent Problem Solving: Difficulty sequencing;Requires verbal cues;Requires tactile cues General Comments: Pt conversive, some tangential conversation but participatory, [facility]      Exercises General Exercises - Lower Extremity Ankle Circles/Pumps: AROM;Strengthening;Both;20 reps;Supine Quad Sets: AROM;Left;5 reps;Supine Short Arc Quad: Strengthening;Right;10 reps;AROM (bed in chair position) Heel Slides: AROM;AAROM;Strengthening;Both;10 reps;Supine (AA on LLE) Hip ABduction/ADduction: AROM;Strengthening;Both;10 reps;Supine Straight Leg Raises: AROM;Right;10 reps;Supine    General Comments General comments (skin integrity, edema, etc.): pt RUE IV site had been dripping scant drops of blood, RN notified to check after session; pt set up to eat dinner at end of session with HOB raised >40 deg      Pertinent Vitals/Pain Pain Assessment: Faces Faces Pain Scale: Hurts even more Pain Location: L LE with min rotation to neutral position on pillow and with abduction Pain Descriptors / Indicators:  Grimacing Pain Intervention(s): Monitored during session;Repositioned   Vitals:   09/28/20 1459  BP: 118/67  Pulse: 83  Resp: 17  Temp: 98.7 F (37.1 C)  SpO2: 95%    Home Living                      Prior Function            PT Goals (current goals can now be found in the care plan section) Acute Rehab PT Goals Patient Stated Goal: improve R UE  strength, return to independence PT Goal Formulation: With patient Time For Goal Achievement: 10/11/20 Potential to Achieve Goals: Good Progress towards PT goals: Progressing toward goals    Frequency    Min 3X/week      PT Plan Current plan remains appropriate    Co-evaluation              AM-PAC PT "6 Clicks" Mobility   Outcome Measure  Help needed turning from your back to your side while in a flat bed without using bedrails?: A Lot Help needed moving from lying on your back to sitting on the side of a flat bed without using bedrails?: A Lot Help needed moving to and from a bed to a chair (including a wheelchair)?: Total Help needed standing up from a chair using your arms (e.g., wheelchair or bedside chair)?: A Lot Help needed to walk in hospital room?: Total Help needed climbing 3-5 steps with a railing? : Total 6 Click Score: 9    End of Session   Activity Tolerance: Patient tolerated treatment well Patient left: in bed;with bed alarm set;with call bell/phone within reach Nurse Communication: Mobility status;Other (comment) (R IV site bleeding slightly) PT Visit Diagnosis: Unsteadiness on feet (R26.81);Muscle weakness (generalized) (M62.81);History of falling (Z91.81)     Time: 3235-5732 PT Time Calculation (min) (ACUTE ONLY): 21 min  Charges:  $Therapeutic Exercise: 8-22 mins                     [name], PTA Acute Rehabilitation Services Pager: [phone] Office: [phone]   Angus Palms 09/28/2020, 4:58 PM

## 2020-09-28 NOTE — Progress Notes (Addendum)
ANTICOAGULATION CONSULT NOTE  Pharmacy Consult for heparin >> apixaban >> heparin Indication: pulmonary embolus  Addendum 09/28/2020 11:53 AM  RN called regarding pt refusing all oral medications. RN states heparin infusion was never stopped. Pharmacy consulted to resume heparin infusion.  Plan: Continue heparin infusion at 1250 units/hr Confirmatory heparin level at 1 pm Monitor daily heparin level, CBC, and s/sx of bleeding  Addendum 09/28/2020 2:15 PM  Confirmatory heparin level was undetectable. Confirmed with RN, no issues with infusion or bleeding.   Goal of Therapy:  Heparin level 0.3-0.7 units/ml Monitor platelets by anticoagulation protocol: Yes  Plan: Increase heparin infusion to 1450 units/hr 8 hour heparin level Monitor daily heparin level, CBC, and s/sx of bleeding F/u transition to apixaban when pt willing to take oral meds  No Known Allergies  Patient Measurements: Height: 5\' 2"  (157.5 cm) (from [facility]where encounter 07/2818) Weight: 52.3 kg (115 lb 4.8 oz) IBW/kg (Calculated) : 50.1  Heparin dosing weight: 52.3 kg  Vital Signs: Temp: 98.4 F (36.9 C) (11/26 0751) Temp Source: Oral (11/26 0751) BP: 121/71 (11/26 0751) Pulse Rate: 82 (11/26 0751)  Labs: Recent Labs    09/25/20 1554 09/26/20 0612 09/27/20 0021 09/27/20 0021 09/27/20 0838 09/27/20 1150 09/27/20 1810 09/28/20 0459  HGB 9.7*   < > 7.0*   < >  --  7.3*  --  7.9*  HCT 30.5*   < > 22.8*  --   --  23.4*  --  24.9*  PLT 218   < > 146*  --   --  163  --  179  HEPARINUNFRC  --   --  0.14*   < > 0.24*  --  0.23* 0.48  CREATININE 0.74  --   --   --  0.57  --   --  0.57   < > = values in this interval not displayed.    Estimated Creatinine Clearance: 41.4 mL/min (by C-G formula based on SCr of 0.57 mg/dL).   Medical History: Past Medical History:  Diagnosis Date  . Anemia   . Dementia (HCC)   . Depression   . Hyperlipidemia   . Hyperthyroidism   . IBS (irritable bowel syndrome)    . Psychosis (HCC) 06/01/2018  . Strabismus     Medications:  Scheduled:  . sodium chloride   Intravenous Once  . acetaminophen  500 mg Oral Q8H  . acetaminophen  650 mg Oral Once  . apixaban  10 mg Oral BID   Followed by  . [START ON 10/05/2020] apixaban  5 mg Oral BID  . vitamin C  500 mg Oral Daily  . aspirin EC  81 mg Oral Daily  . atorvastatin  40 mg Oral Daily  . Chlorhexidine Gluconate Cloth  6 each Topical Daily  . cholecalciferol  2,000 Units Oral BID  . docusate sodium  100 mg Oral BID  . multivitamin with minerals  1 tablet Oral Daily  . Vitamin D (Ergocalciferol)  50,000 Units Oral Q7 days   Infusions:  . heparin 1,250 Units/hr (09/27/20 1945)    Assessment: 35 yoF presenting s/p ground level fall, L distal femoral shaft fracture, s/p repair 11/23.  Patient previously ordered Lovenox prophylaxis post-op but refused - no doses given. Now with PE confirmed on CTA chest. Patient has been on heparin infusion with therapeutic level this AM. Pharmacy consulted to transition to apixaban.  CBC improving, Hgb up to 7.9 and platelet up to 179. Renal function stable.   Goal of Therapy:  Monitor platelets by anticoagulation protocol: Yes   Plan:  Start apixaban 10 mg BID this AM for 7 days, then transition to apixaban 5 mg BID on 10/05/2020 Stop heparin infusion this AM when first dose of apixaban is given Monitor CBC and s/sx of bleeding  Lulu Riding, PharmD, BCPS PGY2 Pharmacy Resident Phone between 7 am - 3:30 pm: [phone]  Please check AMION for all [facility] Pharmacy phone numbers After 10:00 PM, call Main Pharmacy [phone]  09/28/2020

## 2020-09-28 NOTE — Progress Notes (Signed)
MD updated on preliminary results of vascular ultrasound.

## 2020-09-28 NOTE — Progress Notes (Signed)
Pt refusing all oral medications after several attempts to administer meds and pt education. MD updated and order to continue Heparin drip received. Pharmacy updated.

## 2020-09-28 NOTE — Progress Notes (Signed)
Blood transfusion-started 2300- 2nd iv site started for blood- tolerating it and was cooperative through procedure

## 2020-09-28 NOTE — Progress Notes (Signed)
Continues to decline meds and some care like lab draws

## 2020-09-29 ENCOUNTER — Encounter (HOSPITAL_COMMUNITY): Payer: Self-pay | Admitting: Internal Medicine

## 2020-09-29 DIAGNOSIS — Z66 Do not resuscitate: Secondary | ICD-10-CM

## 2020-09-29 DIAGNOSIS — Z7189 Other specified counseling: Secondary | ICD-10-CM

## 2020-09-29 LAB — CBC
HCT: 27.2 % — ABNORMAL LOW (ref 36.0–46.0)
Hemoglobin: 8.6 g/dL — ABNORMAL LOW (ref 12.0–15.0)
MCH: 27.5 pg (ref 26.0–34.0)
MCHC: 31.6 g/dL (ref 30.0–36.0)
MCV: 86.9 fL (ref 80.0–100.0)
Platelets: 225 10*3/uL (ref 150–400)
RBC: 3.13 MIL/uL — ABNORMAL LOW (ref 3.87–5.11)
RDW: 15.4 % (ref 11.5–15.5)
WBC: 6.7 10*3/uL (ref 4.0–10.5)
nRBC: 0 % (ref 0.0–0.2)

## 2020-09-29 LAB — HEPARIN LEVEL (UNFRACTIONATED): Heparin Unfractionated: 0.95 IU/mL — ABNORMAL HIGH (ref 0.30–0.70)

## 2020-09-29 LAB — GLUCOSE, CAPILLARY
Glucose-Capillary: 104 mg/dL — ABNORMAL HIGH (ref 70–99)
Glucose-Capillary: 115 mg/dL — ABNORMAL HIGH (ref 70–99)
Glucose-Capillary: 33 mg/dL — CL (ref 70–99)

## 2020-09-29 MED ORDER — BISACODYL 10 MG RE SUPP: 10.0000 mg | Freq: Every day | RECTAL | Status: DC | PRN

## 2020-09-29 MED ORDER — HYPROMELLOSE (GONIOSCOPIC) 2.5 % OP SOLN
1.0000 [drp] | Freq: Four times a day (QID) | OPHTHALMIC | Status: DC | PRN
  Filled 2020-09-29: qty 15

## 2020-09-29 MED ORDER — MORPHINE SULFATE 10 MG/5ML PO SOLN: 2.5000 mg | ORAL | Status: DC | PRN

## 2020-09-29 MED ORDER — LORAZEPAM 2 MG/ML IJ SOLN: 0.5000 mg | INTRAMUSCULAR | Status: DC | PRN

## 2020-09-29 MED ORDER — MORPHINE SULFATE (PF) 2 MG/ML IV SOLN: 0.5000 mg | INTRAVENOUS | Status: DC | PRN

## 2020-09-29 MED ORDER — BIOTENE DRY MOUTH MT LIQD: 15.0000 mL | OROMUCOSAL | Status: DC | PRN

## 2020-09-29 NOTE — Progress Notes (Signed)
Palliative care orders present.  Cardiac monitor removed.  Patient is pleasant and cooperative.

## 2020-09-29 NOTE — Progress Notes (Signed)
PROGRESS NOTE    Deanna Strong  CZY:[id number] DOB: 1934/10/24 DOA: 09/24/2020 PCP: [name]   Brief Narrative:  Patient is 86 year old female with past medical history of dementia, behavioral disturbances, chronic anemia, depression presents with complaint of fall found to have distal left femur fracture.  Underwent ORIF on 09/25/2020.  Assessment & Plan:  Distal left femur fracture: Status post fall.  Status post ORIF intramedullary femoral nailing on 11/23 by orthopedic surgery. -Profound osteoporosis Intra-Op operatively noted. -Continue pain medication.  Continue to monitor H&H closely. -PT recommended SNF  Acute right sided PE: Acute left lower extremity DVT and peroneal veins: -Reviewed CTA chest and Doppler ultrasound of bilateral lower extremities.  - Reviewed transthoracic echo which showed preserved ejection fraction with grade 1 diastolic dysfunction.   -Patient was started on heparin which was transitioned to p.o. Eliquis however she refused to take p.o. medications. -Patient continue to refuse to take anticoagulation & other medications for her chronic medical issues.  Palliative care consulted.  Patient will be transition to complete comfort care.  Risks of discontinuing anticoagulation discussed with the patient and her daughter and they verbalized understanding.  TIA versus transient worsening of underlying dementia: -Code stroke called on 11/24.  -Neurology consulted. -Underwent CT head, CTA head and neck, CT perfusion study which came back negative for acute stroke. -Appreciated neurology help-recommended no need to repeat CT to confirm stroke. -Neurology signed off -A1c, TSH: WNL, LDL: 95.   -Evaluated by PT/OT/SLP  Acute blood loss anemia: H&H dropped from 9.7-7.9-7.0 -Status post 1 unit blood transfusion on 11/25 -H&H from 11/27 shows 8.6.  History of dementia/depression/psychosis: -Resides in ALF.  Independent on daily life  activities. -cont. Supportive care  Osteoporosis/vitamin D deficiency: -DC vitamin D supplements.  Thrombocytopenia: No signs of active bleeding noted.  Could be heparin-induced.  Continue to monitor.  Resolved.  Goals of care: Palliative care consulted-appreciate help.  Patient transition to complete comfort care.  Stop anticoagulation and other chronic medications.  Only symptomatic treatment.  DC to [facility] with hospice or inpatient hospice.  Case manager notified.  DVT prophylaxis: Full dose heparin Code Status: DNR Family Communication:  None present at bedside.  Plan of care discussed with patient in length and he verbalized understanding and agreed with it.  Called patient's daughter Okey Regal and discussed plan of care and she verbalized understanding.  She is aware of risks of not continuing anticoagulation and she would like to go with her mother's wishes.  Disposition Plan: SNF  Consultants:   Orthopedic surgery  Neurology  Procedures:   ORIF intramedullary femoral nailing  CT head  CTA head and neck  Antimicrobials:   None  Status is: Inpatient  Dispo: The patient is from: ALF              Anticipated d/c is to: [facility] with hospice or inpatient hospice              Anticipated d/c date is: 1-2 days              Patient currently is medically stable for discharge  Subjective: Patient seen and examined.  Tells me that she does not want to take medications and she wants comfort care.  Tells me that she does not want to live her life like that.  She does not want IV/PO medications.  Tells me that she is not depressed or suicidal & she is confident about her decision.  objective: Vitals:   09/28/20  1459 09/28/20 1930 09/29/20 0300 09/29/20 0759  BP: 118/67 112/81 (!) 108/51 133/77  Pulse: 83 88 71 83  Resp: 17 16 16 17   Temp: 98.7 F (37.1 C) 98.5 F (36.9 C) 98.5 F (36.9 C) 98.4 F (36.9 C)  TempSrc: Oral Axillary Axillary Oral  SpO2: 95%  93% 94% 96%  Weight:      Height:        Intake/Output Summary (Last 24 hours) at 09/29/2020 1015 Last data filed at 09/29/2020 0300 Gross per 24 hour  Intake 5.61 ml  Output 700 ml  Net -694.39 ml   Filed Weights   09/25/20 0232 09/25/20 0624  Weight: 52.3 kg 52.3 kg    Examination: General exam: Appears calm and comfortable, on room air, communicating well Respiratory system: Clear to auscultation. Respiratory effort normal. Cardiovascular system: S1 & S2 heard, RRR. No JVD, murmurs, rubs, gallops or clicks. No pedal edema. Gastrointestinal system: Abdomen is nondistended, soft and nontender. No organomegaly or masses felt. Normal bowel sounds heard. Central nervous system: Alert and oriented. No focal neurological deficits. Extremities: Left leg: Bruise noted lateral side of her left leg.  Sutures noted.  Intact, no active bleeding or discharge seen.  Psychiatry: Judgement and insight appear normal. Mood & affect appropriate.   Data Reviewed: I have personally reviewed following labs and imaging studies  CBC: Recent Labs  Lab 09/26/20 1541 09/27/20 0021 09/27/20 1150 09/28/20 0459 09/29/20 0544  WBC 9.2 6.6 6.5 5.7 6.7  HGB 7.7* 7.0* 7.3* 7.9* 8.6*  HCT 24.7* 22.8* 23.4* 24.9* 27.2*  MCV 84.9 84.8 85.7 87.1 86.9  PLT 183 146* 163 179 225   Basic Metabolic Panel: Recent Labs  Lab 09/24/20 1840 09/25/20 1554 09/27/20 0838 09/28/20 0459  NA 135  --  134* 136  K 3.6  --  3.8 3.9  CL 101  --  102 102  CO2 23  --  23 24  GLUCOSE 161*  --  130* 101*  BUN 7*  --  9 9  CREATININE 0.48 0.74 0.57 0.57  CALCIUM 8.6*  --  7.9* 8.3*   GFR: Estimated Creatinine Clearance: 41.4 mL/min (by C-G formula based on SCr of 0.57 mg/dL). Liver Function Tests: Recent Labs  Lab 09/24/20 1840  AST 23  ALT 16  ALKPHOS 78  BILITOT 0.9  PROT 6.3*  ALBUMIN 3.9   No results for input(s): LIPASE, AMYLASE in the last 168 hours. No results for input(s): AMMONIA in the last 168  hours. Coagulation Profile: No results for input(s): INR, PROTIME in the last 168 hours. Cardiac Enzymes: No results for input(s): CKTOTAL, CKMB, CKMBINDEX, TROPONINI in the last 168 hours. BNP (last 3 results) No results for input(s): PROBNP in the last 8760 hours. HbA1C: Recent Labs    09/27/20 0021  HGBA1C 5.4   CBG: Recent Labs  Lab 09/28/20 0001 09/28/20 0642 09/28/20 1208 09/28/20 1836 09/29/20 0604  GLUCAP 108* 112* 96 136* 104*   Lipid Profile: Recent Labs    09/27/20 0021  CHOL 162  HDL 52  LDLCALC 95  TRIG 73  CHOLHDL 3.1   Thyroid Function Tests: Recent Labs    09/26/20 1541  TSH 0.285*   Anemia Panel: No results for input(s): VITAMINB12, FOLATE, FERRITIN, TIBC, IRON, RETICCTPCT in the last 72 hours. Sepsis Labs: No results for input(s): PROCALCITON, LATICACIDVEN in the last 168 hours.  Recent Results (from the past 240 hour(s))  Resp Panel by RT-PCR (Flu A&B, Covid) Nasopharyngeal Swab  Status: None   Collection Time: 09/24/20  6:40 PM   Specimen: Nasopharyngeal Swab; Nasopharyngeal(NP) swabs in vial transport medium  Result Value Ref Range Status   SARS Coronavirus 2 by RT PCR NEGATIVE NEGATIVE Final    Comment: (NOTE) SARS-CoV-2 target nucleic acids are NOT DETECTED.  The SARS-CoV-2 RNA is generally detectable in upper respiratory specimens during the acute phase of infection. The lowest concentration of SARS-CoV-2 viral copies this assay can detect is 138 copies/mL. A negative result does not preclude SARS-Cov-2 infection and should not be used as the sole basis for treatment or other patient management decisions. A negative result may occur with  improper specimen collection/handling, submission of specimen other than nasopharyngeal swab, presence of viral mutation(s) within the areas targeted by this assay, and inadequate number of viral copies(<138 copies/mL). A negative result must be combined with clinical observations, patient  history, and epidemiological information. The expected result is Negative.  Fact Sheet for Patients:  BloggerCourse.com  Fact Sheet for Healthcare Providers:  SeriousBroker.it  This test is no t yet approved or cleared by the [place] FDA and  has been authorized for detection and/or diagnosis of SARS-CoV-2 by FDA under an Emergency Use Authorization (EUA). This EUA will remain  in effect (meaning this test can be used) for the duration of the COVID-19 declaration under Section 564(b)(1) of the Act, 21 U.S.C.section 360bbb-3(b)(1), unless the authorization is terminated  or revoked sooner.       Influenza A by PCR NEGATIVE NEGATIVE Final   Influenza B by PCR NEGATIVE NEGATIVE Final    Comment: (NOTE) The Xpert Xpress SARS-CoV-2/FLU/RSV plus assay is intended as an aid in the diagnosis of influenza from Nasopharyngeal swab specimens and should not be used as a sole basis for treatment. Nasal washings and aspirates are unacceptable for Xpert Xpress SARS-CoV-2/FLU/RSV testing.  Fact Sheet for Patients: BloggerCourse.com  Fact Sheet for Healthcare Providers: SeriousBroker.it  This test is not yet approved or cleared by the [place] FDA and has been authorized for detection and/or diagnosis of SARS-CoV-2 by FDA under an Emergency Use Authorization (EUA). This EUA will remain in effect (meaning this test can be used) for the duration of the COVID-19 declaration under Section 564(b)(1) of the Act, 21 U.S.C. section 360bbb-3(b)(1), unless the authorization is terminated or revoked.  Performed at [facility], [address], [place], [place] [address]       Radiology Studies: ECHOCARDIOGRAM COMPLETE  Result Date: 09/28/2020    ECHOCARDIOGRAM REPORT   Patient Name:   LAROSE BATRES Date of Exam: 09/28/2020 Medical Rec #:  [id number]    Height:        62.0 in Accession #:    [id number]   Weight:       115.3 lb Date of Birth:  12-03-1933    BSA:          1.513 m Patient Age:    86 years     BP:           121/71 mmHg Patient Gender: F            HR:           82 bpm. Exam Location:  Inpatient Procedure: 2D Echo, Cardiac Doppler and Color Doppler Indications:    Pulmonary embolus  History:        Patient has no prior history of Echocardiogram examinations.                 TIA.  Dementia, psychosis, anemia.  Sonographer:    Lavenia Atlas Referring Phys: [id number] Kasandra Knudsen Kache Mcclurg IMPRESSIONS  1. Left ventricular ejection fraction, by estimation, is 60 to 65%. The left ventricle has normal function. The left ventricle has no regional wall motion abnormalities. Left ventricular diastolic parameters are consistent with Grade I diastolic dysfunction (impaired relaxation).  2. Right ventricular systolic function is normal. The right ventricular size is normal. There is mildly elevated pulmonary artery systolic pressure. The estimated right ventricular systolic pressure is 39.5 mmHg.  3. The mitral valve is normal in structure. No evidence of mitral valve regurgitation. No evidence of mitral stenosis.  4. The aortic valve is normal in structure. Aortic valve regurgitation is not visualized. No aortic stenosis is present.  5. The inferior vena cava is normal in size with greater than 50% respiratory variability, suggesting right atrial pressure of 3 mmHg. FINDINGS  Left Ventricle: Left ventricular ejection fraction, by estimation, is 60 to 65%. The left ventricle has normal function. The left ventricle has no regional wall motion abnormalities. The left ventricular internal cavity size was normal in size. There is  no left ventricular hypertrophy. Left ventricular diastolic parameters are consistent with Grade I diastolic dysfunction (impaired relaxation). Indeterminate filling pressures. Right Ventricle: The right ventricular size is normal. No increase in right  ventricular wall thickness. Right ventricular systolic function is normal. There is mildly elevated pulmonary artery systolic pressure. The tricuspid regurgitant velocity is 3.02  m/s, and with an assumed right atrial pressure of 3 mmHg, the estimated right ventricular systolic pressure is 39.5 mmHg. Left Atrium: Left atrial size was normal in size. Right Atrium: Right atrial size was normal in size. Pericardium: There is no evidence of pericardial effusion. Mitral Valve: The mitral valve is normal in structure. No evidence of mitral valve regurgitation. No evidence of mitral valve stenosis. Tricuspid Valve: The tricuspid valve is normal in structure. Tricuspid valve regurgitation is mild . No evidence of tricuspid stenosis. Aortic Valve: The aortic valve is normal in structure. Aortic valve regurgitation is not visualized. No aortic stenosis is present. Pulmonic Valve: The pulmonic valve was normal in structure. Pulmonic valve regurgitation is not visualized. No evidence of pulmonic stenosis. Aorta: The aortic root is normal in size and structure. Venous: The inferior vena cava is normal in size with greater than 50% respiratory variability, suggesting right atrial pressure of 3 mmHg. IAS/Shunts: No atrial level shunt detected by color flow Doppler.  LEFT VENTRICLE PLAX 2D LVIDd:         4.60 cm  Diastology LVIDs:         2.70 cm  LV e' medial:    5.98 cm/s LV PW:         1.10 cm  LV E/e' medial:  12.4 LV IVS:        1.00 cm  LV e' lateral:   5.66 cm/s LVOT diam:     2.10 cm  LV E/e' lateral: 13.1 LV SV:         63 LV SV Index:   41 LVOT Area:     3.46 cm  RIGHT VENTRICLE RV Basal diam:  3.20 cm RV S prime:     14.00 cm/s TAPSE (M-mode): 2.6 cm LEFT ATRIUM             Index       RIGHT ATRIUM           Index LA diam:        3.40 cm 2.25  cm/m  RA Area:     15.60 cm LA Vol (A2C):   32.6 ml 21.55 ml/m RA Volume:   46.30 ml  30.61 ml/m LA Vol (A4C):   26.8 ml 17.72 ml/m LA Biplane Vol: 31.5 ml 20.82 ml/m  AORTIC  VALVE LVOT Vmax:   108.00 cm/s LVOT Vmean:  54.900 cm/s LVOT VTI:    0.181 m  AORTA Ao Root diam: 3.00 cm MITRAL VALVE               TRICUSPID VALVE MV Area (PHT): 3.91 cm    TR Peak grad:   36.5 mmHg MV Decel Time: 194 msec    TR Vmax:        302.00 cm/s MV E velocity: 74.10 cm/s MV A velocity: 86.10 cm/s  SHUNTS MV E/A ratio:  0.86        Systemic VTI:  0.18 m                            Systemic Diam: 2.10 cm Rachelle Hora Croitoru MD Electronically signed by Thurmon Fair MD Signature Date/Time: 09/28/2020/12:15:06 PM    Final    VAS [place] LOWER EXTREMITY VENOUS (DVT)  Result Date: 09/28/2020  Lower Venous DVT Study Indications: Pulmonary embolism.  Risk Factors: Trauma. Limitations: Poor ultrasound/tissue interface. Comparison Study: No prior studies. Performing Technologist: Chanda Busing RVT  Examination Guidelines: A complete evaluation includes B-mode imaging, spectral Doppler, color Doppler, and power Doppler as needed of all accessible portions of each vessel. Bilateral testing is considered an integral part of a complete examination. Limited examinations for reoccurring indications may be performed as noted. The reflux portion of the exam is performed with the patient in reverse Trendelenburg.  +---------+---------------+---------+-----------+----------+--------------+ RIGHT    CompressibilityPhasicitySpontaneityPropertiesThrombus Aging +---------+---------------+---------+-----------+----------+--------------+ CFV      Full           Yes      Yes                                 +---------+---------------+---------+-----------+----------+--------------+ SFJ      Full                                                        +---------+---------------+---------+-----------+----------+--------------+ FV Prox  Full                                                        +---------+---------------+---------+-----------+----------+--------------+ FV Mid   Full                                                         +---------+---------------+---------+-----------+----------+--------------+ FV DistalFull                                                        +---------+---------------+---------+-----------+----------+--------------+  PFV      Full                                                        +---------+---------------+---------+-----------+----------+--------------+ POP      Full           Yes      Yes                                 +---------+---------------+---------+-----------+----------+--------------+ PTV      Full                                                        +---------+---------------+---------+-----------+----------+--------------+ PERO     Full                                                        +---------+---------------+---------+-----------+----------+--------------+   +---------+---------------+---------+-----------+----------+--------------+ LEFT     CompressibilityPhasicitySpontaneityPropertiesThrombus Aging +---------+---------------+---------+-----------+----------+--------------+ CFV      Full           Yes      Yes                                 +---------+---------------+---------+-----------+----------+--------------+ SFJ      Full                                                        +---------+---------------+---------+-----------+----------+--------------+ FV Prox  Full                                                        +---------+---------------+---------+-----------+----------+--------------+ FV Mid   Full                                                        +---------+---------------+---------+-----------+----------+--------------+ FV Distal               Yes      Yes                                 +---------+---------------+---------+-----------+----------+--------------+ PFV      Full                                                         +---------+---------------+---------+-----------+----------+--------------+  POP      Full           Yes      Yes                                 +---------+---------------+---------+-----------+----------+--------------+ PTV      Full                                                        +---------+---------------+---------+-----------+----------+--------------+ PERO     Partial                                      Acute          +---------+---------------+---------+-----------+----------+--------------+     Summary: RIGHT: - There is no evidence of deep vein thrombosis in the lower extremity.  - No cystic structure found in the popliteal fossa.  LEFT: - Findings consistent with acute deep vein thrombosis involving the left peroneal veins. - No cystic structure found in the popliteal fossa.  *See table(s) above for measurements and observations.    Preliminary     Scheduled Meds: . acetaminophen  500 mg Oral Q8H  . acetaminophen  650 mg Oral Once   Continuous Infusions:    LOS: 5 days   Time spent: 45 minutes  Charlsey Moragne Estill Cotta, MD [facility]  If 7PM-7AM, please contact night-coverage [email or web address] 09/29/2020, 10:15 AM

## 2020-09-29 NOTE — Plan of Care (Signed)

## 2020-09-29 NOTE — TOC Progression Note (Signed)
Transition of Care [facility]) - Progression Note    Patient Details  Name: Rissie Sculley MRN: [id number] Date of Birth: 1934/08/19  Transition of Care [facility]) CM/SW Contact  Levada Schilling Phone Number: 09/29/2020, 12:40 PM  Clinical Narrative:    CSW spoke with pt's daughter Okey Regal by phone.  Pt's daughter would prefer for pt to return [facility] with hospice services.  CSW  spoke with Asher Muir from [facility].  Asher Muir will start the progress for hospice service for [facility] and will follow up with hospice again on Monday to see if they can provide services for the pt at [facility].  Pt's daughter and physician has been update with the progress for [facility]. CSW will continue to assist with discharging needs.  Expected Discharge Plan: Skilled Nursing Facility Barriers to Discharge: Continued Medical Work up  Expected Discharge Plan and Services Expected Discharge Plan: Skilled Nursing Facility   Discharge Planning Services: CM Consult   Living arrangements for the past 2 months: Assisted Living Facility                                       Social Determinants of Health (SDOH) Interventions    Readmission Risk Interventions Readmission Risk Prevention Plan 09/25/2020  Transportation Screening Complete  PCP or Specialist Appt within 5-7 Days Complete  Home Care Screening Complete  Medication Review (RN CM) Complete  Some recent data might be hidden

## 2020-09-29 NOTE — Consult Note (Addendum)
Palliative Medicine Inpatient Consult Note  Reason for consult:  Goals of Care  HPI:  Per intake H&P --> Patient is 86 year old female with past medical history of dementia, behavioral disturbances, chronic anemia, depression presents with complaint of fall found to have distal left femur fracture.  Underwent ORIF on 09/25/2020, has an acute right sided PE.   Palliative care was asked to get involved to further address goals of care as patient has been refusing to take her oral anticoagulant.  She also requested that she no longer receives any intravenous anticoagulation from medical team.  Clinical Assessment/Goals of Care: I have reviewed medical records including EPIC notes, labs and imaging, received report from bedside RN, assessed the patient who is laying in bed in no distress.    I met with Latia Mataya and called her daughter Miangel Flom to further discuss diagnosis prognosis, GOC, EOL wishes, disposition and options.   I introduced Palliative Medicine as specialized medical care for people living with serious illness. It focuses on providing relief from the symptoms and stress of a serious illness. The goal is to improve quality of life for both the patient and the family.  Keiley to is from [place] officially.  She immigrated to [place] in 1974.  She is a widow.  She has 2 children a daughter Arbie Cookey and a daughter Becky Sax.  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  She gets great joy out of being in the outdoor space.  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  Nicol lives at [facility].  She is a long-term assisted care resident there.  As far as the level of help that she gets she used to get medication assistance though had refused taking her medicines and no longer received that benefit  A detailed discussion was had today  regarding advanced directives -there are none in [place] though the patient says all decisions are made with both of her daughters.    Concepts specific to code status, artifical feeding and hydration, continued IV antibiotics and rehospitalization was had.    Is a DO NOT RESUSCITATE DO NOT INTUBATE CODE STATUS.  She would not want heroic life-prolonging measures and would rather opt to be made comfortable if she had a terminal diagnosis.    Due to shares with me that that the medical team has told her she has a significant pulmonary embolism.  She does not desire to take any anticoagulation for this.  She vocalizes the risks of this the most notable one being death.  She says "there comes a time when you are finished with life and I am finished".    The difference between a aggressive medical intervention path  and a palliative comfort care path for this patient at this time was had. We talked about transition to comfort measures in house and what that would entail inclusive of medications to control pain, dyspnea, agitation, nausea, itching, and hiccups. We discussed stopping all uneccessary measures such as blood draws, needle sticks, and frequent vital signs.   She shares that this is what she would want and she had thought she had already made the decision to transition to this type of care with Dr. Doristine Bosworth.  Patient's daughter Arbie Cookey is in agreement with what her mother wants.  She and her sister had talked to her mother very openly throughout  their life about what her wishes would be.  We discussed transition back to [place] under hospice care. I described hospice as a service for patients for have a life expectancy of < 6 months. It preserves dignity and quality at the end phases of life. The focus changes from curative to symptom relief.   Discussed the importance of continued conversation with family and their  medical providers regarding overall plan of care and treatment options, ensuring  decisions are within the context of the patients values and GOCs.  Provided "Hard Choices for [employer]" booklet.   Decision Maker: Patient's 2 daughters Bayard Males are her surrogate decision makers.  SUMMARY OF RECOMMENDATIONS   DNAR/DNI  Comfort Focused Care  [place] for patient to transition back to ALF on hospice  Gold DNR on Chart  Code Status/Advance Care Planning: DNAR/DNI   Symptom Management:  Dyspnea: Pain:  - Morphine 63m PO/SL Q1H PRN  - Morphine 0.5-168mIV Q1H PRN Fever:  - Tylenol 65029mO Q6H PRN Agitation:  - Haldol 2mg55m Q6H PRN Anxiety:  - Lorazepam 1mg 57mSL/IV  Q1H PRN Nausea:  - Zofran 4mg P30mV Q6H PRN  - Reglan 5-10mg P75m  Secretions:  - Glycopyrrolate 0.4mg IV 24m ATC Dry Eyes:  - Artificial Tears PRN Xerostomia:  - Biotene 15ml PRN32mBID oral care Urinary Retention:  - Bladder scan if > 250ml plac45md maintain foley  Constipation:  - Bisacodyl 10mg PR [id number] Spiritual:  - Chaplain consult   Palliative Prophylaxis:   Oral care, mobility  Additional Recommendations (Limitations, Scope, Preferences):  Comfort care  Psycho-social/Spiritual:   Desire for further Chaplaincy support: Yes- [place]  Deon Pillingl Recommendations: Education on hospice care   Prognosis: Poor likely days - weeks s/p L hip repair, FTT, PE not on AC  [facility]  Vitals:   09/29/20 0300 09/29/20 0759  BP: (!) 108/51 133/77  Pulse: 71 83  Resp: 16 17  Temp: 98.5 F (36.9 C) 98.4 F (36.9 C)  SpO2: 94% 96%    Intake/Output Summary (Last 24 hours) at 09/29/2020 0926 Last [id number] at 09/29/2020 0300 Gross per 24 hour  Intake 5.61 ml  Output 700 ml  Net -694.39 ml   Last Weight  Most recent update: 09/25/2020  6:25 AM   Weight  52.3 kg (115 lb 4.8 oz)           Gen:  Frail elderly F in NAD HEENT: Dry mucous membranes CV: Regular rate and rhythm    PULM: clear to auscultation bilaterally  ABD: soft/nontender  EXT: LLe (+) pedal edema and ecchymosis at hip l Neuro: Alert and oriented to person and place  PPS: 10%   This conversation/these recommendations were discussed with patient primary care team, Dr. Pahwani  TiDoristine Bosworth0920 Time Out: 1030 Total Time:70 Greater than 50%  of this time was spent counseling and coordinating care related to the above assessment and plan.  Cherylanne Ardelean FeUvalde EstatesCell Phone: [phone] secure chat with additional questions, if there is no response within 30 minutes please call the above phone number  Palliative Medicine Team providers are available by phone from 7am to 7pm daily and can be reached through the team cell phone.  Should this patient require assistance outside of these hours, please call the patient's attending physician.

## 2020-09-29 NOTE — Progress Notes (Signed)
ANTICOAGULATION CONSULT NOTE - Follow Up Consult  Pharmacy Consult for heparin Indication: PE/DVT  Labs: Recent Labs    09/27/20 0021 09/27/20 0838 09/27/20 1150 09/27/20 1810 09/28/20 0459 09/28/20 1255 09/29/20 0544  HGB   < >  --  7.3*  --  7.9*  --  8.6*  HCT   < >  --  23.4*  --  24.9*  --  27.2*  PLT   < >  --  163  --  179  --  225  HEPARINUNFRC  --  0.24*  --    < > 0.48 <0.10* 0.95*  CREATININE  --  0.57  --   --  0.57  --   --    < > = values in this interval not displayed.    Assessment: 86yo female supratherapeutic on heparin after rate change for low level; no gtt issues or signs of bleeding per RN other than some bleeding when IV was pulled out.  Goal of Therapy:  Heparin level 0.3-0.7 units/ml   Plan:  Will decrease heparin gtt by ~10% to 1300 units/hr and check level in 8 hours.    Vernard Gambles, PharmD, BCPS  09/29/2020,6:18 AM

## 2020-09-30 DIAGNOSIS — Z66 Do not resuscitate: Secondary | ICD-10-CM

## 2020-09-30 LAB — GLUCOSE, CAPILLARY: Glucose-Capillary: 138 mg/dL — ABNORMAL HIGH (ref 70–99)

## 2020-09-30 NOTE — Progress Notes (Signed)
   Palliative Medicine Inpatient Follow Up Note  Reason for consult:  Goals of Care  HPI:  Per intake H&P --> Patient is 86 year old female with past medical history of dementia, behavioral disturbances, chronic anemia, depression presents with complaint of fall found to have distal left femur fracture. Underwent ORIF on 09/25/2020, has an acute right sided PE.   Palliative care was asked to get involved to further address goals of care as patient has been refusing to take her oral anticoagulant.  She also requested that she no longer receives any intravenous anticoagulation from medical team.  Today's Discussion (09/30/2020): Chart reviewed. I met with Deanna Strong this morning. She was not very interested in talking to me and asked why I was "seeing her again today?" I again, explained my role. She shared that she wanted to enjoy her breakfast and had nothing to tell me. I kindly excused myself.  Per review of TOC notes patient should ideally get back to [facility] tomorrow under hospice care.  Discussed the importance of continued conversation with family and their  medical providers regarding overall plan of care and treatment options, ensuring decisions are within the context of the patients values and GOCs.  Questions and concerns addressed   Objective Assessment: Vital Signs Vitals:   09/29/20 1930 09/30/20 0300  BP: 126/71 123/67  Pulse: 72 68  Resp: 17 17  Temp:  99.1 F (37.3 C)  SpO2: 97% 97%    Intake/Output Summary (Last 24 hours) at 09/30/2020 1112 Last data filed at 09/30/2020 0058 Gross per 24 hour  Intake --  Output 300 ml  Net -300 ml   Last Weight  Most recent update: 09/25/2020  6:25 AM   Weight  52.3 kg (115 lb 4.8 oz)           Gen:  Frail elderly F in NAD HEENT: Dry mucous membranes CV: Regular rate and rhythm  PULM: clear to auscultation bilaterally  ABD: soft/nontender  EXT: LLE (+) pedal edema and ecchymosis at hip  Neuro: Alert and oriented  to person and place  SUMMARY OF RECOMMENDATIONS DNAR/DNI  [place] for patient to transition back to ALF on hospice as early as Monday  Gold DNR on Chart  Time Spent: 15 Greater than 50% of the time was spent in counseling and coordination of care ______________________________________________________________________________________ [place] Team Team Cell Phone: [phone] Please utilize secure chat with additional questions, if there is no response within 30 minutes please call the above phone number  Palliative Medicine Team providers are available by phone from 7am to 7pm daily and can be reached through the team cell phone.  Should this patient require assistance outside of these hours, please call the patient's attending physician.

## 2020-09-30 NOTE — Plan of Care (Signed)

## 2020-09-30 NOTE — Progress Notes (Signed)
PROGRESS NOTE    Deanna Strong  ZOX:[id number] DOB: 02/01/1934 DOA: 09/24/2020 PCP: Daiva NakayamaPa, [facility]   Brief Narrative:  Patient is 86 year old female with past medical history of dementia, behavioral disturbances, chronic anemia, depression presents with complaint of fall found to have distal left femur fracture.  Underwent ORIF on 09/25/2020.  Assessment & Plan:  Distal left femur fracture: Status post ORIF intramedullary femoral nailing on 11/23 by orthopedic surgery.  Acute right sided PE: Acute left lower extremity DVT and peroneal veins: -Pt transitioned to comfort care-NO anticoagulation as per patient's wishes.  TIA versus transient worsening of underlying dementia: -Code stroke called on 11/24.  -Underwent CT head, CTA head and neck, CT perfusion study which came back negative for acute stroke.   Acute blood loss anemia: H&H dropped from 9.7-7.9-7.0 -Status post 1 unit blood transfusion on 11/25 -H&H from 11/27 shows 8.6.  History of dementia/depression/psychosis: -Resides in ALF.  Independent on daily life activities at baseline. -cont. Supportive care  Osteoporosis/vitamin D deficiency: -DC vitamin D supplements.  Thrombocytopenia:  Resolved.  Goals of care: Appreciate Palliative care help. Pt is full comfort care. Cont. Comfort focused care. [facility] or inpatient hospice likely tomorrow.  DVT prophylaxis: Full dose heparin Code Status: DNR Family Communication:  None present at bedside.  Plan of care discussed with patient in length and he verbalized understanding and agreed with it.  Disposition Plan: SNF  Consultants:   Orthopedic surgery  Neurology  Procedures:   ORIF intramedullary femoral nailing  CT head  CTA head and neck  Antimicrobials:   None  Status is: Inpatient  Dispo: The patient is from: ALF              Anticipated d/c is to: [facility] with hospice or inpatient hospice               Anticipated d/c date is: 10/01/2020              Patient currently is medically stable for discharge  Subjective: Patient seen and examined.  Pleasantly demented.  Tells me that she had a good breakfast this morning.  No new complaints.  objective: Vitals:   09/29/20 0759 09/29/20 1356 09/29/20 1930 09/30/20 0300  BP: 133/77 114/68 126/71 123/67  Pulse: 83 79 72 68  Resp: 17 17 17 17   Temp: 98.4 F (36.9 C) 99.2 F (37.3 C) 98.7 F (37.1 C) 99.1 F (37.3 C)  TempSrc: Oral Oral Oral Oral  SpO2: 96% 96% 97% 97%  Weight:      Height:        Intake/Output Summary (Last 24 hours) at 09/30/2020 1201 Last data filed at 09/30/2020 0058 Gross per 24 hour  Intake --  Output 300 ml  Net -300 ml   Filed Weights   09/25/20 0232 09/25/20 0624  Weight: 52.3 kg 52.3 kg    Examination: General exam: Appears calm and comfortable, on room air Respiratory system: Clear to auscultation. Respiratory effort normal. Cardiovascular system: S1 & S2 heard, RRR. No JVD, murmurs, rubs, gallops or clicks. No pedal edema. Gastrointestinal system: Abdomen is nondistended, soft and nontender. No organomegaly or masses felt. Normal bowel sounds heard. Central nervous system: Alert and pleasantly demented.  Following commands. Extremities: Symmetric 5 x 5 power. Skin: Bruises noted on right thigh.  Sutures intact.  No signs of active bleeding noted.  Data Reviewed: I have personally reviewed following labs and imaging studies  CBC: Recent Labs  Lab  09/26/20 1541 09/27/20 0021 09/27/20 1150 09/28/20 0459 09/29/20 0544  WBC 9.2 6.6 6.5 5.7 6.7  HGB 7.7* 7.0* 7.3* 7.9* 8.6*  HCT 24.7* 22.8* 23.4* 24.9* 27.2*  MCV 84.9 84.8 85.7 87.1 86.9  PLT 183 146* 163 179 225   Basic Metabolic Panel: Recent Labs  Lab 09/24/20 1840 09/25/20 1554 09/27/20 0838 09/28/20 0459  NA 135  --  134* 136  K 3.6  --  3.8 3.9  CL 101  --  102 102  CO2 23  --  23 24  GLUCOSE 161*  --  130* 101*  BUN 7*  --   9 9  CREATININE 0.48 0.74 0.57 0.57  CALCIUM 8.6*  --  7.9* 8.3*   GFR: Estimated Creatinine Clearance: 41.4 mL/min (by C-G formula based on SCr of 0.57 mg/dL). Liver Function Tests: Recent Labs  Lab 09/24/20 1840  AST 23  ALT 16  ALKPHOS 78  BILITOT 0.9  PROT 6.3*  ALBUMIN 3.9   No results for input(s): LIPASE, AMYLASE in the last 168 hours. No results for input(s): AMMONIA in the last 168 hours. Coagulation Profile: No results for input(s): INR, PROTIME in the last 168 hours. Cardiac Enzymes: No results for input(s): CKTOTAL, CKMB, CKMBINDEX, TROPONINI in the last 168 hours. BNP (last 3 results) No results for input(s): PROBNP in the last 8760 hours. HbA1C: No results for input(s): HGBA1C in the last 72 hours. CBG: Recent Labs  Lab 09/28/20 1208 09/28/20 1836 09/29/20 0604 09/29/20 1201 09/29/20 2338  GLUCAP 96 136* 104* 115* 33*   Lipid Profile: No results for input(s): CHOL, HDL, LDLCALC, TRIG, CHOLHDL, LDLDIRECT in the last 72 hours. Thyroid Function Tests: No results for input(s): TSH, T4TOTAL, FREET4, T3FREE, THYROIDAB in the last 72 hours. Anemia Panel: No results for input(s): VITAMINB12, FOLATE, FERRITIN, TIBC, IRON, RETICCTPCT in the last 72 hours. Sepsis Labs: No results for input(s): PROCALCITON, LATICACIDVEN in the last 168 hours.  Recent Results (from the past 240 hour(s))  Resp Panel by RT-PCR (Flu A&B, Covid) Nasopharyngeal Swab     Status: None   Collection Time: 09/24/20  6:40 PM   Specimen: Nasopharyngeal Swab; Nasopharyngeal(NP) swabs in vial transport medium  Result Value Ref Range Status   SARS Coronavirus 2 by RT PCR NEGATIVE NEGATIVE Final    Comment: (NOTE) SARS-CoV-2 target nucleic acids are NOT DETECTED.  The SARS-CoV-2 RNA is generally detectable in upper respiratory specimens during the acute phase of infection. The lowest concentration of SARS-CoV-2 viral copies this assay can detect is 138 copies/mL. A negative result does not  preclude SARS-Cov-2 infection and should not be used as the sole basis for treatment or other patient management decisions. A negative result may occur with  improper specimen collection/handling, submission of specimen other than nasopharyngeal swab, presence of viral mutation(s) within the areas targeted by this assay, and inadequate number of viral copies(<138 copies/mL). A negative result must be combined with clinical observations, patient history, and epidemiological information. The expected result is Negative.  Fact Sheet for Patients:  BloggerCourse.com  Fact Sheet for Healthcare Providers:  SeriousBroker.it  This test is no t yet approved or cleared by the [place] FDA and  has been authorized for detection and/or diagnosis of SARS-CoV-2 by FDA under an Emergency Use Authorization (EUA). This EUA will remain  in effect (meaning this test can be used) for the duration of the COVID-19 declaration under Section 564(b)(1) of the Act, 21 U.S.C.section 360bbb-3(b)(1), unless the authorization is terminated  or  revoked sooner.       Influenza A by PCR NEGATIVE NEGATIVE Final   Influenza B by PCR NEGATIVE NEGATIVE Final    Comment: (NOTE) The Xpert Xpress SARS-CoV-2/FLU/RSV plus assay is intended as an aid in the diagnosis of influenza from Nasopharyngeal swab specimens and should not be used as a sole basis for treatment. Nasal washings and aspirates are unacceptable for Xpert Xpress SARS-CoV-2/FLU/RSV testing.  Fact Sheet for Patients: BloggerCourse.com  Fact Sheet for Healthcare Providers: SeriousBroker.it  This test is not yet approved or cleared by the [place] FDA and has been authorized for detection and/or diagnosis of SARS-CoV-2 by FDA under an Emergency Use Authorization (EUA). This EUA will remain in effect (meaning this test can be used) for the  duration of the COVID-19 declaration under Section 564(b)(1) of the Act, 21 U.S.C. section 360bbb-3(b)(1), unless the authorization is terminated or revoked.  Performed at [facility], [address], [place], [place] [address]       Radiology Studies: VAS [place] LOWER EXTREMITY VENOUS (DVT)  Result Date: 09/28/2020  Lower Venous DVT Study Indications: Pulmonary embolism.  Risk Factors: Trauma. Limitations: Poor ultrasound/tissue interface. Comparison Study: No prior studies. Performing Technologist: Chanda Busing RVT  Examination Guidelines: A complete evaluation includes B-mode imaging, spectral Doppler, color Doppler, and power Doppler as needed of all accessible portions of each vessel. Bilateral testing is considered an integral part of a complete examination. Limited examinations for reoccurring indications may be performed as noted. The reflux portion of the exam is performed with the patient in reverse Trendelenburg.  +---------+---------------+---------+-----------+----------+--------------+ RIGHT    CompressibilityPhasicitySpontaneityPropertiesThrombus Aging +---------+---------------+---------+-----------+----------+--------------+ CFV      Full           Yes      Yes                                 +---------+---------------+---------+-----------+----------+--------------+ SFJ      Full                                                        +---------+---------------+---------+-----------+----------+--------------+ FV Prox  Full                                                        +---------+---------------+---------+-----------+----------+--------------+ FV Mid   Full                                                        +---------+---------------+---------+-----------+----------+--------------+ FV DistalFull                                                         +---------+---------------+---------+-----------+----------+--------------+ PFV      Full                                                        +---------+---------------+---------+-----------+----------+--------------+  POP      Full           Yes      Yes                                 +---------+---------------+---------+-----------+----------+--------------+ PTV      Full                                                        +---------+---------------+---------+-----------+----------+--------------+ PERO     Full                                                        +---------+---------------+---------+-----------+----------+--------------+   +---------+---------------+---------+-----------+----------+--------------+ LEFT     CompressibilityPhasicitySpontaneityPropertiesThrombus Aging +---------+---------------+---------+-----------+----------+--------------+ CFV      Full           Yes      Yes                                 +---------+---------------+---------+-----------+----------+--------------+ SFJ      Full                                                        +---------+---------------+---------+-----------+----------+--------------+ FV Prox  Full                                                        +---------+---------------+---------+-----------+----------+--------------+ FV Mid   Full                                                        +---------+---------------+---------+-----------+----------+--------------+ FV Distal               Yes      Yes                                 +---------+---------------+---------+-----------+----------+--------------+ PFV      Full                                                        +---------+---------------+---------+-----------+----------+--------------+ POP      Full           Yes      Yes                                  +---------+---------------+---------+-----------+----------+--------------+  PTV      Full                                                        +---------+---------------+---------+-----------+----------+--------------+ PERO     Partial                                      Acute          +---------+---------------+---------+-----------+----------+--------------+     Summary: RIGHT: - There is no evidence of deep vein thrombosis in the lower extremity.  - No cystic structure found in the popliteal fossa.  LEFT: - Findings consistent with acute deep vein thrombosis involving the left peroneal veins. - No cystic structure found in the popliteal fossa.  *See table(s) above for measurements and observations.    Preliminary     Scheduled Meds:  Continuous Infusions:    LOS: 6 days   Time spent: 45 minutes  Hanh Kertesz Estill Cotta, MD [facility]  If 7PM-7AM, please contact night-coverage [email or web address] 09/30/2020, 12:01 PM

## 2020-09-30 NOTE — Progress Notes (Signed)
Pt's blood sugar at this time 33. Pt refused dextrose IV, food and drink. Pt's desire is "to die" and for [place] to respect her wish. MD informed.

## 2020-10-01 NOTE — Plan of Care (Signed)

## 2020-10-01 NOTE — Progress Notes (Addendum)
[facility] Liaison Note:  Received request from [facility] for family interest in [facility]. Chart reviewed and hospice eligibility under review at this time.   Spoke with family to acknowledge referral, support and answer hospice related questions.    Unfortunately, [facility] is not able to offer a room today.   Family and TOC are aware Civil engineer, contracting liaison will follow tomorrow or sooner if room becomes available.   Please do not hesitate to call with questions.  Thank you.Marland Kitchen   Roda Shutters, RN [facility] Liaison  [phone] [place] are on AMION   UPDATE: Per [facility] Physician - Patient is hospice eligible but not yet eligible for [facility]. TOC aware.

## 2020-10-01 NOTE — Progress Notes (Signed)
PROGRESS NOTE    Deanna Strong  SEG:[id number] DOB: [birth year] DOA: 09/24/2020 PCP: [name]   Brief Narrative:  Patient is 86 year old female with past medical history of dementia, behavioral disturbances, chronic anemia, depression presents with complaint of fall found to have distal left femur fracture.  Underwent ORIF on 09/25/2020.  Assessment & Plan:  Distal left femur fracture: Status post ORIF intramedullary femoral nailing on 11/23 by orthopedic surgery.  Acute right sided PE: Acute left lower extremity DVT and peroneal veins: -Pt transitioned to comfort care-NO anticoagulation as per patient's wishes.  TIA versus transient worsening of underlying dementia: -Code stroke called on 11/24.  -Underwent CT head, CTA head and neck, CT perfusion study which came back negative for acute stroke.   Acute blood loss anemia: H&H dropped from 9.7-7.9-7.0 -Status post 1 unit blood transfusion on 11/25 -H&H from 11/27 shows 8.6.  History of dementia/depression/psychosis: -Resides in ALF.  Independent on daily life activities at baseline. -cont. Supportive care  Osteoporosis/vitamin D deficiency: -Discontinue vitamin D supplements.  Thrombocytopenia:  Resolved.  Goals of care: Appreciate Palliative care help. Pt is full comfort care. Cont. Comfort focused care.  CM-working on patient's bed placement either inpatient hospice versus [facility] with hospice.  DVT prophylaxis: Full dose heparin Code Status: DNR Family Communication:  None present at bedside.  Plan of care discussed with patient in length and he verbalized understanding and agreed with it.  Disposition Plan: [facility] with hospice versus inpatient hospice  Consultants:   Orthopedic surgery  Neurology  Procedures:   ORIF intramedullary femoral nailing  CT head  CTA head and neck  Antimicrobials:   None  Status is: Inpatient  Dispo: The patient is from: ALF               Anticipated d/c is to: [facility] with hospice or inpatient hospice              Anticipated d/c date is: 10/01/2020              Patient currently is medically stable for discharge  Subjective: Patient seen and examined.  Pleasantly demented.  Tells me that she does not want to go to [facility] house as she feels like trapped over there.  objective: Vitals:   09/30/20 0300 09/30/20 2005 09/30/20 2005 10/01/20 0735  BP: 123/67 116/66 116/66 104/63  Pulse: 68 80 80 85  Resp: 17 18 17 18   Temp: 99.1 F (37.3 C) 98.1 F (36.7 C) 98.1 F (36.7 C) 98.6 F (37 C)  TempSrc: Oral Oral Oral Oral  SpO2: 97% 96% 96% 98%  Weight:      Height:        Intake/Output Summary (Last 24 hours) at 10/01/2020 1208 Last data filed at 10/01/2020 0456 Gross per 24 hour  Intake --  Output 400 ml  Net -400 ml   Filed Weights   09/25/20 0232 09/25/20 0624  Weight: 52.3 kg 52.3 kg    Examination: General exam: Appears calm and comfortable, pleasantly demented, on room air Respiratory system: Clear to auscultation. Respiratory effort normal. Cardiovascular system: S1 & S2 heard, RRR. No JVD, murmurs, rubs, gallops or clicks. No pedal edema. Gastrointestinal system: Abdomen is nondistended, soft and nontender. No organomegaly or masses felt. Normal bowel sounds heard. Central nervous system: Alert and and following commands Extremities: Symmetric 5 x 5 power. Skin: Large bruise noted in left upper and medial thigh.  Data Reviewed: I have personally reviewed following labs and  imaging studies  CBC: Recent Labs  Lab 09/26/20 1541 09/27/20 0021 09/27/20 1150 09/28/20 0459 09/29/20 0544  WBC 9.2 6.6 6.5 5.7 6.7  HGB 7.7* 7.0* 7.3* 7.9* 8.6*  HCT 24.7* 22.8* 23.4* 24.9* 27.2*  MCV 84.9 84.8 85.7 87.1 86.9  PLT 183 146* 163 179 225   Basic Metabolic Panel: Recent Labs  Lab 09/24/20 1840 09/25/20 1554 09/27/20 0838 09/28/20 0459  NA 135  --  134* 136  K 3.6  --  3.8 3.9  CL 101   --  102 102  CO2 23  --  23 24  GLUCOSE 161*  --  130* 101*  BUN 7*  --  9 9  CREATININE 0.48 0.74 0.57 0.57  CALCIUM 8.6*  --  7.9* 8.3*   GFR: Estimated Creatinine Clearance: 41.4 mL/min (by C-G formula based on SCr of 0.57 mg/dL). Liver Function Tests: Recent Labs  Lab 09/24/20 1840  AST 23  ALT 16  ALKPHOS 78  BILITOT 0.9  PROT 6.3*  ALBUMIN 3.9   No results for input(s): LIPASE, AMYLASE in the last 168 hours. No results for input(s): AMMONIA in the last 168 hours. Coagulation Profile: No results for input(s): INR, PROTIME in the last 168 hours. Cardiac Enzymes: No results for input(s): CKTOTAL, CKMB, CKMBINDEX, TROPONINI in the last 168 hours. BNP (last 3 results) No results for input(s): PROBNP in the last 8760 hours. HbA1C: No results for input(s): HGBA1C in the last 72 hours. CBG: Recent Labs  Lab 09/28/20 1836 09/29/20 0604 09/29/20 1201 09/29/20 2338 09/30/20 1702  GLUCAP 136* 104* 115* 33* 138*   Lipid Profile: No results for input(s): CHOL, HDL, LDLCALC, TRIG, CHOLHDL, LDLDIRECT in the last 72 hours. Thyroid Function Tests: No results for input(s): TSH, T4TOTAL, FREET4, T3FREE, THYROIDAB in the last 72 hours. Anemia Panel: No results for input(s): VITAMINB12, FOLATE, FERRITIN, TIBC, IRON, RETICCTPCT in the last 72 hours. Sepsis Labs: No results for input(s): PROCALCITON, LATICACIDVEN in the last 168 hours.  Recent Results (from the past 240 hour(s))  Resp Panel by RT-PCR (Flu A&B, Covid) Nasopharyngeal Swab     Status: None   Collection Time: 09/24/20  6:40 PM   Specimen: Nasopharyngeal Swab; Nasopharyngeal(NP) swabs in vial transport medium  Result Value Ref Range Status   SARS Coronavirus 2 by RT PCR NEGATIVE NEGATIVE Final    Comment: (NOTE) SARS-CoV-2 target nucleic acids are NOT DETECTED.  The SARS-CoV-2 RNA is generally detectable in upper respiratory specimens during the acute phase of infection. The lowest concentration of SARS-CoV-2  viral copies this assay can detect is 138 copies/mL. A negative result does not preclude SARS-Cov-2 infection and should not be used as the sole basis for treatment or other patient management decisions. A negative result may occur with  improper specimen collection/handling, submission of specimen other than nasopharyngeal swab, presence of viral mutation(s) within the areas targeted by this assay, and inadequate number of viral copies(<138 copies/mL). A negative result must be combined with clinical observations, patient history, and epidemiological information. The expected result is Negative.  Fact Sheet for Patients:  BloggerCourse.com  Fact Sheet for Healthcare Providers:  SeriousBroker.it  This test is no t yet approved or cleared by the [place] FDA and  has been authorized for detection and/or diagnosis of SARS-CoV-2 by FDA under an Emergency Use Authorization (EUA). This EUA will remain  in effect (meaning this test can be used) for the duration of the COVID-19 declaration under Section 564(b)(1) of the Act, 21 U.S.C.section  360bbb-3(b)(1), unless the authorization is terminated  or revoked sooner.       Influenza A by PCR NEGATIVE NEGATIVE Final   Influenza B by PCR NEGATIVE NEGATIVE Final    Comment: (NOTE) The Xpert Xpress SARS-CoV-2/FLU/RSV plus assay is intended as an aid in the diagnosis of influenza from Nasopharyngeal swab specimens and should not be used as a sole basis for treatment. Nasal washings and aspirates are unacceptable for Xpert Xpress SARS-CoV-2/FLU/RSV testing.  Fact Sheet for Patients: BloggerCourse.com  Fact Sheet for Healthcare Providers: SeriousBroker.it  This test is not yet approved or cleared by the [place] FDA and has been authorized for detection and/or diagnosis of SARS-CoV-2 by FDA under an Emergency Use Authorization  (EUA). This EUA will remain in effect (meaning this test can be used) for the duration of the COVID-19 declaration under Section 564(b)(1) of the Act, 21 U.S.C. section 360bbb-3(b)(1), unless the authorization is terminated or revoked.  Performed at [facility], [address], [place], [place] [address]       Radiology Studies: No results found.  Scheduled Meds:  Continuous Infusions:    LOS: 7 days   Time spent: 45 minutes  Dianah Pruett Estill Cotta, MD [facility]  If 7PM-7AM, please contact night-coverage [email or web address] 10/01/2020, 12:08 PM

## 2020-10-01 NOTE — TOC Progression Note (Signed)
Transition of Care [facility]) - Progression Note    Patient Details  Name: Deanna Strong MRN: [id number] Date of Birth: 01/16/1934  Transition of Care [facility]) CM/SW Contact  Janae Bridgeman, RN Phone Number: 10/01/2020, 11:56 AM  Clinical Narrative:    Case management spoke with the patient at the bedside regarding patient's request for hospice care services.  The patient does not want to return to [facility] and is asking for placement at [facility]  [facility] and the patient and family are aware of this.  The patient is oriented to self and time with a history of dementia.  I called the patient's daughter on the phone and she was given choice regarding hospice services and chose [facility] to assist with comfort care measure and possible placement back to [facility] or inpatient hospice if she qualifies.  I called and spoke with Rubin Payor, RNCM on the phone and she will speak with the patient and daughter, Okey Regal, on the phone regarding hospice care services.  If the patient discharges back to [facility] - she will need a wheelchair.  She is currently in the independent living building of [address] and will need increased care provided by the facility.  I spoke with Tharon Aquas, CM at the facility and I faxed all related PT notes and FL2 to the facility to see if they could provide this level of care to the patient.  Will continue to follow for discharge plans.   Expected Discharge Plan: Skilled Nursing Facility Barriers to Discharge: Continued Medical Work up  Expected Discharge Plan and Services Expected Discharge Plan: Skilled Nursing Facility   Discharge Planning Services: CM Consult   Living arrangements for the past 2 months: Assisted Living Facility                                       Social Determinants of Health (SDOH) Interventions    Readmission Risk Interventions Readmission Risk  Prevention Plan 09/25/2020  Transportation Screening Complete  PCP or Specialist Appt within 5-7 Days Complete  Home Care Screening Complete  Medication Review (RN CM) Complete  Some recent data might be hidden

## 2020-10-02 DIAGNOSIS — F29 Unspecified psychosis not due to a substance or known physiological condition: Secondary | ICD-10-CM | POA: Diagnosis present

## 2020-10-02 DIAGNOSIS — F32A Depression, unspecified: Secondary | ICD-10-CM | POA: Diagnosis not present

## 2020-10-02 DIAGNOSIS — M2550 Pain in unspecified joint: Secondary | ICD-10-CM | POA: Diagnosis not present

## 2020-10-02 DIAGNOSIS — G459 Transient cerebral ischemic attack, unspecified: Secondary | ICD-10-CM | POA: Diagnosis not present

## 2020-10-02 DIAGNOSIS — M6281 Muscle weakness (generalized): Secondary | ICD-10-CM | POA: Diagnosis present

## 2020-10-02 DIAGNOSIS — S72402A Unspecified fracture of lower end of left femur, initial encounter for closed fracture: Secondary | ICD-10-CM | POA: Diagnosis not present

## 2020-10-02 DIAGNOSIS — R488 Other symbolic dysfunctions: Secondary | ICD-10-CM | POA: Diagnosis present

## 2020-10-02 DIAGNOSIS — D649 Anemia, unspecified: Secondary | ICD-10-CM | POA: Diagnosis not present

## 2020-10-02 DIAGNOSIS — W19XXXA Unspecified fall, initial encounter: Secondary | ICD-10-CM | POA: Diagnosis not present

## 2020-10-02 DIAGNOSIS — R2681 Unsteadiness on feet: Secondary | ICD-10-CM | POA: Diagnosis present

## 2020-10-02 DIAGNOSIS — Z66 Do not resuscitate: Secondary | ICD-10-CM | POA: Diagnosis not present

## 2020-10-02 DIAGNOSIS — S7292XD Unspecified fracture of left femur, subsequent encounter for closed fracture with routine healing: Secondary | ICD-10-CM | POA: Diagnosis not present

## 2020-10-02 DIAGNOSIS — E559 Vitamin D deficiency, unspecified: Secondary | ICD-10-CM | POA: Diagnosis not present

## 2020-10-02 DIAGNOSIS — S7292XA Unspecified fracture of left femur, initial encounter for closed fracture: Secondary | ICD-10-CM | POA: Diagnosis not present

## 2020-10-02 DIAGNOSIS — R278 Other lack of coordination: Secondary | ICD-10-CM | POA: Diagnosis present

## 2020-10-02 DIAGNOSIS — R2689 Other abnormalities of gait and mobility: Secondary | ICD-10-CM | POA: Diagnosis present

## 2020-10-02 DIAGNOSIS — Z515 Encounter for palliative care: Secondary | ICD-10-CM

## 2020-10-02 DIAGNOSIS — R296 Repeated falls: Secondary | ICD-10-CM | POA: Diagnosis present

## 2020-10-02 DIAGNOSIS — R531 Weakness: Secondary | ICD-10-CM | POA: Diagnosis not present

## 2020-10-02 DIAGNOSIS — F0390 Unspecified dementia without behavioral disturbance: Secondary | ICD-10-CM | POA: Diagnosis not present

## 2020-10-02 DIAGNOSIS — Z7401 Bed confinement status: Secondary | ICD-10-CM | POA: Diagnosis not present

## 2020-10-02 DIAGNOSIS — S7290XD Unspecified fracture of unspecified femur, subsequent encounter for closed fracture with routine healing: Secondary | ICD-10-CM | POA: Diagnosis not present

## 2020-10-02 DIAGNOSIS — Z5320 Procedure and treatment not carried out because of patient's decision for unspecified reasons: Secondary | ICD-10-CM | POA: Diagnosis not present

## 2020-10-02 LAB — SARS CORONAVIRUS 2 BY RT PCR (HOSPITAL ORDER, PERFORMED IN ~~LOC~~ HOSPITAL LAB): SARS Coronavirus 2: NEGATIVE

## 2020-10-02 MED ORDER — HALOPERIDOL LACTATE 5 MG/ML IJ SOLN: 2.0000 mg | Freq: Four times a day (QID) | INTRAMUSCULAR | 0 refills | Status: AC | PRN

## 2020-10-02 NOTE — Progress Notes (Signed)
Civil engineer, contracting [facility])  Received request from [facility] for family interest in [facility]. Chart reviewed and hospice eligibility confirmed but not [facility] appropriate at this time. Daughter Okey Regal is aware. Due to needing more supportive care at [facility] they cant take pt back at this time so [facility] Marcelino Duster is looking into rehab/PT facility [facility]. Daughter Okey Regal would like to try this and then pick up with hospice afterwards. [facility] numbers have been given to [facility] to call once ready for hospice.  If in the event something changes or needing family assistance at bedside or decisions please call other sister Celine Mans at [phone]. Otherwise can still call Okey Regal if able to answer at a distance.   Thank you for this referral. Please feel free to call with any further hospice questions.  Yolande Jolly, BSN, [facility]in [place]) [phone]

## 2020-10-02 NOTE — TOC Progression Note (Addendum)
Transition of Care [facility]) - Progression Note    Patient Details  Name: Deanna Strong MRN: [id number] Date of Birth: [birth year]  Transition of Care [facility]) CM/SW Contact  Deanna Bridgeman, RN Phone Number: 10/02/2020, 1:08 PM  Clinical Narrative:    Case management spoke with Deanna Strong, CM with [facility] ALF and the facility said that they were unable to provide 24-hour supervision and care to the patient and the patient could not return to the ALF under hospice care.  The patient will need to be placed at a SNF for rehab and have palliative care provided through [facility] at  The facility.  I called and spoke with Deanna Strong, the patient's daughter and let her know that the ALF facility was asking that the patient transfer to a SNF first for rehab.  I spoke with the patient's daughter, Deanna Strong, and gave her Medicare choice regarding the [facility] facility and the daughter chose [facility].  The patient's PASSR is currently pending and aq copy of the 30-day note was uploaded to the [employer].  The patient's COVID screen will need to be completed before she can transfer but [facility] has an available bed to the patient and the family is agreeable.  I called Deanna Strong, CM with [facility] and made her aware that since the patient did not qualify for inpatient hospice that the patient would be transferred to the SNF for rehab with palliative care resources and return to the ALF at a later date.  Will continue to follow the patient for discharge to the SNF facility today.  10/02/2020 - Angelito, primary RN sill be calling report to [facility] at [phone].  [facility] was called and transport was arranged to the facility.   Expected Discharge Plan: Skilled Nursing Facility Barriers to Discharge: Continued Medical Work up  Expected Discharge Plan and Services Expected Discharge Plan: Skilled Nursing Facility   Discharge Planning Services: CM Consult   Living arrangements for the  past 2 months: Assisted Living Facility                                       Social Determinants of Health (SDOH) Interventions    Readmission Risk Interventions Readmission Risk Prevention Plan 09/25/2020  Transportation Screening Complete  PCP or Specialist Appt within 5-7 Days Complete  Home Care Screening Complete  Medication Review (RN CM) Complete  Some recent data might be hidden

## 2020-10-02 NOTE — Progress Notes (Signed)
Report called to [facility] @ [facility].Patient awaiting PTA.

## 2020-10-02 NOTE — Progress Notes (Signed)
Attempted to call  [facility] x2 at [phone] and leave a message, still awaiting a return call.

## 2020-10-02 NOTE — Plan of Care (Signed)

## 2020-10-02 NOTE — Care Management Important Message (Signed)
Important Message  Patient Details  Name: Deanna Strong MRN: [id number] Date of Birth: April 03, 1934   Medicare Important Message Given:  Yes     Rudie Sermons P Agata Lucente 10/02/2020, 3:23 PM

## 2020-10-02 NOTE — Progress Notes (Signed)
Nutrition Brief Note  Chart reviewed. Pt now transitioning to comfort care.  No further nutrition interventions warranted at this time.  Please re-consult as needed.   Shevonne Wolf W, RD, LDN, CDCES Registered Dietitian II Certified Diabetes Care and Education Specialist Please refer to AMION for RD and/or RD on-call/weekend/after hours pager  

## 2020-10-02 NOTE — Discharge Summary (Signed)
Physician Discharge Summary  Deanna Strong WGN:[id number] DOB: 06/03/1934 DOA: 09/24/2020  PCP: [name]  Admit date: 09/24/2020 Discharge date: 10/02/2020  Admitted From: [facility]  disposition: SNF with hospice  Recommendations for Outpatient Follow-up:  1. Comfort care  Home Health: None Equipment/Devices: None Discharge Condition: Stable CODE STATUS: DNR Diet recommendation: Regular diet  Brief/Interim Summary: Patient is 86 year old female with past medical history of dementia, behavioral disturbances, chronic anemia, depression presents with complaint of fall found to have distal left femur fracture.  Underwent ORIF on 09/25/2020.  Postop.period  Complicated with acute PE and left lower extremity DVT.  Patient started on heparin and then transitioned to p.o. Eliquis however patient refused to take medications and requested for complete comfort care.  Palliative care consulted and patient transitioned to comfort care.  Discontinued all of her medications and placed her on symptomatic treatment as per palliative.  Distal left femur fracture: Status post ORIF intramedullary femoral nailing on 11/23 by orthopedic surgery.  Acute right sided PE: Acute left lower extremity DVT and peroneal veins: -Pt transitioned to comfort care-NO anticoagulation as per patient's wishes.  Discussed risks of not continuation of anticoagulation with patient and her daughter included but not limited to death and they both verbalized understanding.  TIA versus transient worsening of underlying dementia: -Code stroke called on 11/24.  -Underwent CT head, CTA head and neck, CT perfusion study which came back negative for acute stroke.  Neurology consulted who recommended to start on aspirin, statin which was discontinued later as per patient's wishes.  Acute blood loss anemia: H&H dropped from 9.7-7.9-7.0 -Status post 1 unit blood transfusion on 11/25 -H&H from 11/27 showed  8.6.  History of dementia/depression/psychosis: -Resides in ALF.  Independent on daily life activities at baseline.  Osteoporosis/vitamin D deficiency: -Discontinued vitamin D supplements.  Thrombocytopenia:  Resolved.  Goals of care: Appreciated Palliative care help. Pt transitioned to full comfort care as per patient's and her family wishes. Cont.d Comfort focused care while patient was hospitalized.   Patient will be discharged to SNF with hospice.  She is stable at the time of discharge.  Discharge Diagnoses:   Distal left femur fracture Acute right sided PE Acute left lower extremity DVT TIA versus transient worsening of underlying dementia Acute blood loss anemia History of dementia/depression/psychosis Osteoporosis/vitamin D deficiency Thrombocytopenia  Discharge Instructions  Discharge Instructions    Diet - low sodium heart healthy   Complete by: As directed    Discharge instructions   Complete by: As directed    Comfort care   Increase activity slowly   Complete by: As directed    No dressing needed   Complete by: As directed      Allergies as of 10/02/2020   No Known Allergies     Medication List    TAKE these medications   acetaminophen 325 MG tablet Commonly known as: TYLENOL Take 1-2 tablets (325-650 mg total) by mouth every 6 (six) hours as needed for mild pain (pain score 1-3 or temp > 100.5). What changed:   medication strength  how much to take  when to take this  reasons to take this   ascorbic acid 500 MG tablet Commonly known as: VITAMIN C Take 1 tablet (500 mg total) by mouth daily.   ciclopirox 0.77 % cream Commonly known as: LOPROX Apply 1 application topically 2 (two) times daily. Apply to feet for fungal infection   guaiFENesin 100 MG/5ML Soln Commonly known as: ROBITUSSIN Take 10 mLs by  mouth every 6 (six) hours as needed for cough or to loosen phlegm.   haloperidol lactate 5 MG/ML injection Commonly known as:  HALDOL Inject 0.4 mLs (2 mg total) into the vein every 6 (six) hours as needed.   loperamide 2 MG capsule Commonly known as: IMODIUM Take 2 mg by mouth daily as needed for diarrhea or loose stools.   magnesium hydroxide 400 MG/5ML suspension Commonly known as: MILK OF MAGNESIA Take 30 mLs by mouth daily as needed for mild constipation.   Mintox 200-200-20 MG/5ML suspension Generic drug: alum & mag hydroxide-simeth Take 30 mLs by mouth every 6 (six) hours as needed for indigestion or heartburn.   multivitamin with minerals Tabs tablet Take 1 tablet by mouth daily.   neomycin-bacitracin-polymyxin Oint Commonly known as: NEOSPORIN Apply 1 application topically daily as needed for wound care.   OVER THE COUNTER MEDICATION Take 1 tablet by mouth 2 (two) times daily. Digestive enzyme all natural   Vitamin D (Ergocalciferol) 1.25 MG (50000 UNIT) Caps capsule Commonly known as: DRISDOL Take 1 capsule (50,000 Units total) by mouth every 7 (seven) days. Start taking on: October 03, 2020   Vitamin D 125 MCG (5000 UT) Caps Take 1 capsule by mouth daily.            Discharge Care Instructions  (From admission, onward)         Start     Ordered   10/02/20 0000  No dressing needed        10/02/20 1438          Follow-up Information    Myrene Galas, MD. Schedule an appointment as soon as possible for a visit in 2 week(s).   Specialty: Orthopedic Surgery Contact information: [address] [place] [address] [phone]              No Known Allergies  Consultations:  Orthopedic surgery  Palliative care   Procedures/Studies: CT ANGIO CHEST PE W OR WO CONTRAST  Result Date: 09/26/2020 CLINICAL DATA:  Postoperative hypoxia. EXAM: CT ANGIOGRAPHY CHEST WITH CONTRAST TECHNIQUE: Multidetector CT imaging of the chest was performed using the standard protocol during bolus administration of intravenous contrast. Multiplanar CT image reconstructions and MIPs  were obtained to evaluate the vascular anatomy. CONTRAST:  38mL OMNIPAQUE IOHEXOL 350 MG/ML SOLN COMPARISON:  None. FINDINGS: Cardiovascular: There is mild to moderate severity calcification of the aortic arch. A mild amount of intraluminal low attenuation is seen involving anteromedial right upper lobe and posteromedial right lower lobe branches of the right pulmonary artery. Normal heart size without evidence of heart strain. No pericardial effusion. Mediastinum/Nodes: No enlarged mediastinal, hilar, or axillary lymph nodes. Thyroid gland, trachea, and esophagus demonstrate no significant findings. Lungs/Pleura: Mild to moderate severity linear scarring and/or atelectasis is seen within the bilateral lower lobes, right greater than left. There is no evidence of a pleural effusion or pneumothorax. Upper Abdomen: Multiple focal areas of low attenuation are seen scattered throughout the liver. The largest measures approximately 2.4 cm x 1.4 cm and is seen within the anteromedial aspect of the right lobe. Musculoskeletal: Chronic compression fracture deformities are seen involving the T6, T8, T12 and L1 vertebral bodies. Review of the MIP images confirms the above findings. IMPRESSION: 1. Mild pulmonary embolism involving right upper lobe and right lower lobe branches of the right pulmonary artery. 2. Mild to moderate severity bilateral lower lobe linear scarring and/or atelectasis. 3. Multiple low-attenuation liver lesions which are nonspecific and may represent multiple hemangiomas.  Due to the size of these lesions (greater than 1.5 cm) correlation with MRI of the abdomen is recommended. 4. Chronic compression fracture deformities involving the T6, T8, T12 and L1 vertebral bodies. 5. Aortic atherosclerosis. Aortic Atherosclerosis (ICD10-I70.0). Electronically Signed   By: Aram Candela M.D.   On: 09/26/2020 16:29   CT CEREBRAL PERFUSION W CONTRAST  Result Date: 09/26/2020 CLINICAL DATA:  Stroke suspected.  EXAM: CT ANGIOGRAPHY HEAD AND NECK CT PERFUSION BRAIN TECHNIQUE: Multidetector CT imaging of the head and neck was performed using the standard protocol during bolus administration of intravenous contrast. Multiplanar CT image reconstructions and MIPs were obtained to evaluate the vascular anatomy. Carotid stenosis measurements (when applicable) are obtained utilizing NASCET criteria, using the distal internal carotid diameter as the denominator. Multiphase CT imaging of the brain was performed following IV bolus contrast injection. Subsequent parametric perfusion maps were calculated using RAPID software. CONTRAST:  Administered contrast not known at this time. COMPARISON:  One hundred Omnipaque 350 intravenous contrast. FINDINGS: CTA NECK FINDINGS Aortic arch: Standard aortic branching. Atherosclerotic plaque within the visualized aortic arch. No hemodynamically significant innominate or proximal subclavian artery stenosis. Right carotid system: CCA and ICA patent within the neck without stenosis. Mild soft and calcified plaque within the carotid bulb. [place]uous [place] of cervical ICA. Left carotid system: CCA and ICA patent within the neck without stenosis. Mild calcified plaque within the carotid bifurcation and carotid bulb. Tortuosity of the cervical ICA. Vertebral arteries: Patent within the neck. The left vertebral artery is dominant. There is severe narrowing of the right vertebral artery at the C4-C5 level due to compression from adjacent uncovertebral and facet hypertrophy (series 5, image 129). There is also mild nonspecific segmental narrowing of the right vertebral artery at the C2-C3 level. No significant stenosis of the left vertebral artery. Skeleton: No acute bony abnormality or aggressive osseous lesion. Cervical spondylosis. C2-C3 and C7-T1 grade 1 anterolisthesis. Other neck: 2.2 cm left thyroid lobe nodule with internal foci of calcification. No cervical lymphadenopathy. Upper chest: No  consolidation within the imaged lung apices. There are apparent small filling defects within bilateral upper lobe pulmonary arterial branches suspicious for small pulmonary emboli (series 5, image 1). Review of the MIP images confirms the above findings CTA HEAD FINDINGS Anterior circulation: The intracranial internal carotid arteries are patent. The M1 middle cerebral arteries are patent. Atherosclerotic irregularity of the M2 and more distal MCA branch vessels (greater on the right). However, no M2 proximal branch occlusion or high-grade proximal stenosis is identified. The anterior cerebral arteries are patent. No intracranial aneurysm is identified. Posterior circulation: The intracranial vertebral arteries are patent. The basilar artery is patent. The posterior cerebral arteries are patent. Posterior communicating arteries are hypoplastic or absent bilaterally. Venous sinuses: Within the limitations of contrast timing, no convincing thrombus. Anatomic variants: As described Review of the MIP images confirms the above findings CT Brain Perfusion Findings: ASPECTS: 10 CBF (<30%) Volume: 0mL Perfusion (Tmax>6.0s) volume: 0mL Mismatch Volume: 0mL Infarction Location:None identified These results were called by telephone at the time of interpretation on 09/26/2020 at 1:50 pm to provider PRAMOD SETHI , who verbally acknowledged these results. IMPRESSION: CTA neck: 1. The bilateral common and internal carotid arteries are patent within the neck without stenosis. Mild atherosclerotic plaque within both carotid systems. 2. Vertebral arteries patent within the neck. Severe narrowing of the right vertebral artery at the C4-C5 level due to compression from adjacent uncovertebral and facet hypertrophy. Mild nonspecific segmental narrowing of the right vertebral artery  at the C2-C3 level. 3. Suspected small bilateral upper lobe pulmonary emboli at the imaged levels. Consider dedicated CT angiogram of the chest for further  evaluation. CTA head: No intracranial large vessel occlusion or proximal high-grade arterial stenosis. CT perfusion head: The perfusion software identifies no core infarct. The perfusion software identifies no region of critically hypoperfused brain parenchyma utilizing the Tmax>6 seconds threshold. Electronically Signed   By: Jackey Loge DO   On: 09/26/2020 13:53   DG C-Arm 1-60 Min  Result Date: 09/25/2020 CLINICAL DATA:  ORIF of femur fracture. EXAM: LEFT FEMUR 2 VIEWS; DG C-ARM 1-60 MIN COMPARISON:  Yesterday. FINDINGS: Placement of intramedullary nail with proximal and distal locking screws. Components appear well position. Alignment appears grossly satisfactory. IMPRESSION: Good appearance following intramedullary nail placement. No visible complication. Alignment is grossly satisfactory. Electronically Signed   By: Paulina Fusi M.D.   On: 09/25/2020 13:36   ECHOCARDIOGRAM COMPLETE  Result Date: 09/28/2020    ECHOCARDIOGRAM REPORT   Patient Name:   JOHNNAY PLEITEZ Date of Exam: 09/28/2020 Medical Rec #:  [id number]    Height:       62.0 in Accession #:    [id number]   Weight:       115.3 lb Date of Birth:  [birth year]    BSA:          1.513 m Patient Age:    86 years     BP:           121/71 mmHg Patient Gender: F            HR:           82 bpm. Exam Location:  Inpatient Procedure: 2D Echo, Cardiac Doppler and Color Doppler Indications:    Pulmonary embolus  History:        Patient has no prior history of Echocardiogram examinations.                 TIA. Dementia, psychosis, anemia.  Sonographer:    Lavenia Atlas Referring Phys: [id number] Kasandra Knudsen Jessy Calixte IMPRESSIONS  1. Left ventricular ejection fraction, by estimation, is 60 to 65%. The left ventricle has normal function. The left ventricle has no regional wall motion abnormalities. Left ventricular diastolic parameters are consistent with Grade I diastolic dysfunction (impaired relaxation).  2. Right ventricular systolic function is normal. The  right ventricular size is normal. There is mildly elevated pulmonary artery systolic pressure. The estimated right ventricular systolic pressure is 39.5 mmHg.  3. The mitral valve is normal in structure. No evidence of mitral valve regurgitation. No evidence of mitral stenosis.  4. The aortic valve is normal in structure. Aortic valve regurgitation is not visualized. No aortic stenosis is present.  5. The inferior vena cava is normal in size with greater than 50% respiratory variability, suggesting right atrial pressure of 3 mmHg. FINDINGS  Left Ventricle: Left ventricular ejection fraction, by estimation, is 60 to 65%. The left ventricle has normal function. The left ventricle has no regional wall motion abnormalities. The left ventricular internal cavity size was normal in size. There is  no left ventricular hypertrophy. Left ventricular diastolic parameters are consistent with Grade I diastolic dysfunction (impaired relaxation). Indeterminate filling pressures. Right Ventricle: The right ventricular size is normal. No increase in right ventricular wall thickness. Right ventricular systolic function is normal. There is mildly elevated pulmonary artery systolic pressure. The tricuspid regurgitant velocity is 3.02  m/s, and with an assumed right atrial pressure of 3 mmHg, the estimated  right ventricular systolic pressure is 39.5 mmHg. Left Atrium: Left atrial size was normal in size. Right Atrium: Right atrial size was normal in size. Pericardium: There is no evidence of pericardial effusion. Mitral Valve: The mitral valve is normal in structure. No evidence of mitral valve regurgitation. No evidence of mitral valve stenosis. Tricuspid Valve: The tricuspid valve is normal in structure. Tricuspid valve regurgitation is mild . No evidence of tricuspid stenosis. Aortic Valve: The aortic valve is normal in structure. Aortic valve regurgitation is not visualized. No aortic stenosis is present. Pulmonic Valve: The  pulmonic valve was normal in structure. Pulmonic valve regurgitation is not visualized. No evidence of pulmonic stenosis. Aorta: The aortic root is normal in size and structure. Venous: The inferior vena cava is normal in size with greater than 50% respiratory variability, suggesting right atrial pressure of 3 mmHg. IAS/Shunts: No atrial level shunt detected by color flow Doppler.  LEFT VENTRICLE PLAX 2D LVIDd:         4.60 cm  Diastology LVIDs:         2.70 cm  LV e' medial:    5.98 cm/s LV PW:         1.10 cm  LV E/e' medial:  12.4 LV IVS:        1.00 cm  LV e' lateral:   5.66 cm/s LVOT diam:     2.10 cm  LV E/e' lateral: 13.1 LV SV:         63 LV SV Index:   41 LVOT Area:     3.46 cm  RIGHT VENTRICLE RV Basal diam:  3.20 cm RV S prime:     14.00 cm/s TAPSE (M-mode): 2.6 cm LEFT ATRIUM             Index       RIGHT ATRIUM           Index LA diam:        3.40 cm 2.25 cm/m  RA Area:     15.60 cm LA Vol (A2C):   32.6 ml 21.55 ml/m RA Volume:   46.30 ml  30.61 ml/m LA Vol (A4C):   26.8 ml 17.72 ml/m LA Biplane Vol: 31.5 ml 20.82 ml/m  AORTIC VALVE LVOT Vmax:   108.00 cm/s LVOT Vmean:  54.900 cm/s LVOT VTI:    0.181 m  AORTA Ao Root diam: 3.00 cm MITRAL VALVE               TRICUSPID VALVE MV Area (PHT): 3.91 cm    TR Peak grad:   36.5 mmHg MV Decel Time: 194 msec    TR Vmax:        302.00 cm/s MV E velocity: 74.10 cm/s MV A velocity: 86.10 cm/s  SHUNTS MV E/A ratio:  0.86        Systemic VTI:  0.18 m                            Systemic Diam: 2.10 cm Rachelle Hora Croitoru MD Electronically signed by Thurmon Fair MD Signature Date/Time: 09/28/2020/12:15:06 PM    Final    DG Hip Unilat With Pelvis 2-3 Views Left  Result Date: 09/24/2020 CLINICAL DATA:  Status post fall. EXAM: DG HIP (WITH OR WITHOUT PELVIS) 2-3V LEFT COMPARISON:  None. FINDINGS: Partially visualized acute displaced fracture of the mid left femoral shaft. Possible femoral neck fracture. There is no evidence of severe arthropathy. Frontal views of  the  visualized portions of the pelvis and right hip are grossly unremarkable. IMPRESSION: 1. Markedly limited evaluation due to nonconventional views. 2. Partially visualized acute displaced fracture of the mid left femoral shaft. Recommend dedicated x-ray left femur views. 3. A left femoral neck fracture cannot be excluded. Recommend repeat dedicated x-ray left hip views. Electronically Signed   By: Tish Frederickson M.D.   On: 09/24/2020 15:18   DG FEMUR MIN 2 VIEWS LEFT  Result Date: 09/25/2020 CLINICAL DATA:  ORIF of femur fracture. EXAM: LEFT FEMUR 2 VIEWS; DG C-ARM 1-60 MIN COMPARISON:  Yesterday. FINDINGS: Placement of intramedullary nail with proximal and distal locking screws. Components appear well position. Alignment appears grossly satisfactory. IMPRESSION: Good appearance following intramedullary nail placement. No visible complication. Alignment is grossly satisfactory. Electronically Signed   By: Paulina Fusi M.D.   On: 09/25/2020 13:36   DG Femur Min 2 Views Left  Result Date: 09/24/2020 CLINICAL DATA:  Larey Seat earlier today EXAM: LEFT FEMUR 2 VIEWS COMPARISON:  09/24/2020 FINDINGS: Proximal left femur/left femoral head and neck are poorly evaluated due to positioning and limited visibility. There is an acute comminuted fracture involving the distal shaft of the femur with moderate posterior angulation of distal fracture fragments. There is impaction at the fracture site with multiple displaced bone fragments. IMPRESSION: Acute comminuted, impacted and angulated fracture involving the distal shaft of the femur. Left femoral head and neck are poorly evaluated. Electronically Signed   By: Jasmine Pang M.D.   On: 09/24/2020 17:38   DG FEMUR PORT MIN 2 VIEWS LEFT  Result Date: 09/25/2020 CLINICAL DATA:  Postop fracture. EXAM: LEFT FEMUR PORTABLE 2 VIEWS COMPARISON:  Intraoperative x-ray left femur 09/25/2020, x-ray left femur 09/24/2020 FINDINGS: Status post intramedullary rod with  interlocking screw fixation of the left femoral shaft. Improved anatomical alignment and fixation of a comminuted, posteriorly displaced, acute distal left femoral shaft fracture. There is no evidence of a new displaced fracture or aggressive appearing focal bone lesions. Associated subcutaneus soft tissue edema consistent with postsurgical changes. IMPRESSION: Status post open reduction internal fixation of a left distal femoral shaft fracture with intramedullary rod and interlocking screw fixation. Electronically Signed   By: Tish Frederickson M.D.   On: 09/25/2020 16:45   CT HEAD CODE STROKE WO CONTRAST  Result Date: 09/26/2020 CLINICAL DATA:  Code stroke. Neuro deficit, acute, stroke suspected. EXAM: CT HEAD WITHOUT CONTRAST TECHNIQUE: Contiguous axial images were obtained from the base of the skull through the vertex without intravenous contrast. COMPARISON:  Head CT 06/01/2018. FINDINGS: Brain: Mild generalized cerebral atrophy. Moderately advanced ill-defined hypoattenuation within the cerebral white matter is nonspecific, but compatible with chronic small vessel ischemic disease. There is no acute intracranial hemorrhage. No demarcated cortical infarct. No extra-axial fluid collection. No evidence of intracranial mass. No midline shift. Vascular: No hyperdense vessel. Skull: Atherosclerotic calcifications Sinuses/Orbits: Visualized orbits show no acute finding. No significant paranasal sinus disease at the imaged levels. ASPECTS [facility] Stroke Program Early CT Score) - Ganglionic level infarction (caudate, lentiform nuclei, internal capsule, insula, M1-M3 cortex):7 - Supraganglionic infarction (M4-M6 cortex): 3 Total score (0-10 with 10 being normal): 10 These results were communicated to Dr. Pearlean Brownie At 1:08 pmon 11/24/2021by text page via the [facility] messaging system. IMPRESSION: No evidence of acute intracranial abnormality. Mild cerebral atrophy and moderately advanced cerebral white matter chronic small  vessel ischemic disease, progressed as compared to the head CT of 06/01/2018. Mild cerebral atrophy. Electronically Signed   By: Jackey Loge DO   On:  09/26/2020 13:14   VAS [place] LOWER EXTREMITY VENOUS (DVT)  Result Date: 09/28/2020  Lower Venous DVT Study Indications: Pulmonary embolism.  Risk Factors: Trauma. Limitations: Poor ultrasound/tissue interface. Comparison Study: No prior studies. Performing Technologist: Chanda Busing RVT  Examination Guidelines: A complete evaluation includes B-mode imaging, spectral Doppler, color Doppler, and power Doppler as needed of all accessible portions of each vessel. Bilateral testing is considered an integral part of a complete examination. Limited examinations for reoccurring indications may be performed as noted. The reflux portion of the exam is performed with the patient in reverse Trendelenburg.  +---------+---------------+---------+-----------+----------+--------------+ RIGHT    CompressibilityPhasicitySpontaneityPropertiesThrombus Aging +---------+---------------+---------+-----------+----------+--------------+ CFV      Full           Yes      Yes                                 +---------+---------------+---------+-----------+----------+--------------+ SFJ      Full                                                        +---------+---------------+---------+-----------+----------+--------------+ FV Prox  Full                                                        +---------+---------------+---------+-----------+----------+--------------+ FV Mid   Full                                                        +---------+---------------+---------+-----------+----------+--------------+ FV DistalFull                                                        +---------+---------------+---------+-----------+----------+--------------+ PFV      Full                                                         +---------+---------------+---------+-----------+----------+--------------+ POP      Full           Yes      Yes                                 +---------+---------------+---------+-----------+----------+--------------+ PTV      Full                                                        +---------+---------------+---------+-----------+----------+--------------+ PERO     Full                                                        +---------+---------------+---------+-----------+----------+--------------+   +---------+---------------+---------+-----------+----------+--------------+  LEFT     CompressibilityPhasicitySpontaneityPropertiesThrombus Aging +---------+---------------+---------+-----------+----------+--------------+ CFV      Full           Yes      Yes                                 +---------+---------------+---------+-----------+----------+--------------+ SFJ      Full                                                        +---------+---------------+---------+-----------+----------+--------------+ FV Prox  Full                                                        +---------+---------------+---------+-----------+----------+--------------+ FV Mid   Full                                                        +---------+---------------+---------+-----------+----------+--------------+ FV Distal               Yes      Yes                                 +---------+---------------+---------+-----------+----------+--------------+ PFV      Full                                                        +---------+---------------+---------+-----------+----------+--------------+ POP      Full           Yes      Yes                                 +---------+---------------+---------+-----------+----------+--------------+ PTV      Full                                                         +---------+---------------+---------+-----------+----------+--------------+ PERO     Partial                                      Acute          +---------+---------------+---------+-----------+----------+--------------+     Summary: RIGHT: - There is no evidence of deep vein thrombosis in the lower extremity.  - No cystic structure found in the popliteal fossa.  LEFT: - Findings consistent with acute deep vein thrombosis involving the left peroneal veins. - No cystic structure found in the popliteal fossa.  *See table(s) above  for measurements and observations.    Preliminary    CT ANGIO NECK CODE STROKE  Result Date: 09/26/2020 CLINICAL DATA:  Stroke suspected. EXAM: CT ANGIOGRAPHY HEAD AND NECK CT PERFUSION BRAIN TECHNIQUE: Multidetector CT imaging of the head and neck was performed using the standard protocol during bolus administration of intravenous contrast. Multiplanar CT image reconstructions and MIPs were obtained to evaluate the vascular anatomy. Carotid stenosis measurements (when applicable) are obtained utilizing NASCET criteria, using the distal internal carotid diameter as the denominator. Multiphase CT imaging of the brain was performed following IV bolus contrast injection. Subsequent parametric perfusion maps were calculated using RAPID software. CONTRAST:  Administered contrast not known at this time. COMPARISON:  One hundred Omnipaque 350 intravenous contrast. FINDINGS: CTA NECK FINDINGS Aortic arch: Standard aortic branching. Atherosclerotic plaque within the visualized aortic arch. No hemodynamically significant innominate or proximal subclavian artery stenosis. Right carotid system: CCA and ICA patent within the neck without stenosis. Mild soft and calcified plaque within the carotid bulb. [place]uous city of cervical ICA. Left carotid system: CCA and ICA patent within the neck without stenosis. Mild calcified plaque within the carotid bifurcation and carotid bulb. Tortuosity of the  cervical ICA. Vertebral arteries: Patent within the neck. The left vertebral artery is dominant. There is severe narrowing of the right vertebral artery at the C4-C5 level due to compression from adjacent uncovertebral and facet hypertrophy (series 5, image 129). There is also mild nonspecific segmental narrowing of the right vertebral artery at the C2-C3 level. No significant stenosis of the left vertebral artery. Skeleton: No acute bony abnormality or aggressive osseous lesion. Cervical spondylosis. C2-C3 and C7-T1 grade 1 anterolisthesis. Other neck: 2.2 cm left thyroid lobe nodule with internal foci of calcification. No cervical lymphadenopathy. Upper chest: No consolidation within the imaged lung apices. There are apparent small filling defects within bilateral upper lobe pulmonary arterial branches suspicious for small pulmonary emboli (series 5, image 1). Review of the MIP images confirms the above findings CTA HEAD FINDINGS Anterior circulation: The intracranial internal carotid arteries are patent. The M1 middle cerebral arteries are patent. Atherosclerotic irregularity of the M2 and more distal MCA branch vessels (greater on the right). However, no M2 proximal branch occlusion or high-grade proximal stenosis is identified. The anterior cerebral arteries are patent. No intracranial aneurysm is identified. Posterior circulation: The intracranial vertebral arteries are patent. The basilar artery is patent. The posterior cerebral arteries are patent. Posterior communicating arteries are hypoplastic or absent bilaterally. Venous sinuses: Within the limitations of contrast timing, no convincing thrombus. Anatomic variants: As described Review of the MIP images confirms the above findings CT Brain Perfusion Findings: ASPECTS: 10 CBF (<30%) Volume: 55mL Perfusion (Tmax>6.0s) volume: 55mL Mismatch Volume: 41mL Infarction Location:None identified These results were called by telephone at the time of interpretation on  09/26/2020 at 1:50 pm to provider PRAMOD SETHI , who verbally acknowledged these results. IMPRESSION: CTA neck: 1. The bilateral common and internal carotid arteries are patent within the neck without stenosis. Mild atherosclerotic plaque within both carotid systems. 2. Vertebral arteries patent within the neck. Severe narrowing of the right vertebral artery at the C4-C5 level due to compression from adjacent uncovertebral and facet hypertrophy. Mild nonspecific segmental narrowing of the right vertebral artery at the C2-C3 level. 3. Suspected small bilateral upper lobe pulmonary emboli at the imaged levels. Consider dedicated CT angiogram of the chest for further evaluation. CTA head: No intracranial large vessel occlusion or proximal high-grade arterial stenosis. CT perfusion head: The perfusion software  identifies no core infarct. The perfusion software identifies no region of critically hypoperfused brain parenchyma utilizing the Tmax>6 seconds threshold. Electronically Signed   By: Jackey LogeKyle  Golden DO   On: 09/26/2020 13:53       Subjective: Patient seen and examined.  Pleasantly demented.  No new complaints. Discharge Exam: Vitals:   10/02/20 0413 10/02/20 0800  BP: 114/64 100/65  Pulse: 84 88  Resp: 15 18  Temp: 97.9 F (36.6 C) 97.9 F (36.6 C)  SpO2: 99% 100%   Vitals:   10/01/20 0735 10/01/20 2016 10/02/20 0413 10/02/20 0800  BP: 104/63 (!) 103/56 114/64 100/65  Pulse: 85 77 84 88  Resp: 18 16 15 18   Temp: 98.6 F (37 C) 97.9 F (36.6 C) 97.9 F (36.6 C) 97.9 F (36.6 C)  TempSrc: Oral Oral Oral Oral  SpO2: 98% 96% 99% 100%  Weight:      Height:        General: Pt is alert, awake, not in acute distress, on room air, elderly.  Pleasantly demented. Cardiovascular: RRR, S1/S2 +, no rubs, no gallops Respiratory: CTA bilaterally, no wheezing, no rhonchi Abdominal: Soft, NT, ND, bowel sounds + Extremities: no edema, bruise noted in left upper thigh.    The results of  significant diagnostics from this hospitalization (including imaging, microbiology, ancillary and laboratory) are listed below for reference.     Microbiology: Recent Results (from the past 240 hour(s))  Resp Panel by RT-PCR (Flu A&B, Covid) Nasopharyngeal Swab     Status: None   Collection Time: 09/24/20  6:40 PM   Specimen: Nasopharyngeal Swab; Nasopharyngeal(NP) swabs in vial transport medium  Result Value Ref Range Status   SARS Coronavirus 2 by RT PCR NEGATIVE NEGATIVE Final    Comment: (NOTE) SARS-CoV-2 target nucleic acids are NOT DETECTED.  The SARS-CoV-2 RNA is generally detectable in upper respiratory specimens during the acute phase of infection. The lowest concentration of SARS-CoV-2 viral copies this assay can detect is 138 copies/mL. A negative result does not preclude SARS-Cov-2 infection and should not be used as the sole basis for treatment or other patient management decisions. A negative result may occur with  improper specimen collection/handling, submission of specimen other than nasopharyngeal swab, presence of viral mutation(s) within the areas targeted by this assay, and inadequate number of viral copies(<138 copies/mL). A negative result must be combined with clinical observations, patient history, and epidemiological information. The expected result is Negative.  Fact Sheet for Patients:  BloggerCourse.comhttps://www.fda.gov/media/152166/download  Fact Sheet for Healthcare Providers:  SeriousBroker.ithttps://www.fda.gov/media/152162/download  This test is no t yet approved or cleared by the [place] FDA and  has been authorized for detection and/or diagnosis of SARS-CoV-2 by FDA under an Emergency Use Authorization (EUA). This EUA will remain  in effect (meaning this test can be used) for the duration of the COVID-19 declaration under Section 564(b)(1) of the Act, 21 U.S.C.section 360bbb-3(b)(1), unless the authorization is terminated  or revoked sooner.       Influenza A by  PCR NEGATIVE NEGATIVE Final   Influenza B by PCR NEGATIVE NEGATIVE Final    Comment: (NOTE) The Xpert Xpress SARS-CoV-2/FLU/RSV plus assay is intended as an aid in the diagnosis of influenza from Nasopharyngeal swab specimens and should not be used as a sole basis for treatment. Nasal washings and aspirates are unacceptable for Xpert Xpress SARS-CoV-2/FLU/RSV testing.  Fact Sheet for Patients: BloggerCourse.comhttps://www.fda.gov/media/152166/download  Fact Sheet for Healthcare Providers: SeriousBroker.ithttps://www.fda.gov/media/152162/download  This test is not yet approved or cleared by the  [place] [employer] and has been authorized for detection and/or diagnosis of SARS-CoV-2 by FDA under an TEFL teacher (EUA). This EUA will remain in effect (meaning this test can be used) for the duration of the COVID-19 declaration under Section 564(b)(1) of the Act, 21 U.S.C. section 360bbb-3(b)(1), unless the authorization is terminated or revoked.  Performed at [facility], [address] [place], [place] [address]      Labs: BNP (last 3 results) No results for input(s): BNP in the last 8760 hours. Basic Metabolic Panel: Recent Labs  Lab 09/25/20 1554 09/27/20 0838 09/28/20 0459  NA  --  134* 136  K  --  3.8 3.9  CL  --  102 102  CO2  --  23 24  GLUCOSE  --  130* 101*  BUN  --  9 9  CREATININE 0.74 0.57 0.57  CALCIUM  --  7.9* 8.3*   Liver Function Tests: No results for input(s): AST, ALT, ALKPHOS, BILITOT, PROT, ALBUMIN in the last 168 hours. No results for input(s): LIPASE, AMYLASE in the last 168 hours. No results for input(s): AMMONIA in the last 168 hours. CBC: Recent Labs  Lab 09/26/20 1541 09/27/20 0021 09/27/20 1150 09/28/20 0459 09/29/20 0544  WBC 9.2 6.6 6.5 5.7 6.7  HGB 7.7* 7.0* 7.3* 7.9* 8.6*  HCT 24.7* 22.8* 23.4* 24.9* 27.2*  MCV 84.9 84.8 85.7 87.1 86.9  PLT 183 146* 163 179 225   Cardiac Enzymes: No results for input(s): CKTOTAL, CKMB,  CKMBINDEX, TROPONINI in the last 168 hours. BNP: Invalid input(s): POCBNP CBG: Recent Labs  Lab 09/28/20 1836 09/29/20 0604 09/29/20 1201 09/29/20 2338 09/30/20 1702  GLUCAP 136* 104* 115* 33* 138*   D-Dimer No results for input(s): DDIMER in the last 72 hours. Hgb A1c No results for input(s): HGBA1C in the last 72 hours. Lipid Profile No results for input(s): CHOL, HDL, LDLCALC, TRIG, CHOLHDL, LDLDIRECT in the last 72 hours. Thyroid function studies No results for input(s): TSH, T4TOTAL, T3FREE, THYROIDAB in the last 72 hours.  Invalid input(s): FREET3 Anemia work up No results for input(s): VITAMINB12, FOLATE, FERRITIN, TIBC, IRON, RETICCTPCT in the last 72 hours. Urinalysis    Component Value Date/Time   COLORURINE YELLOW 05/14/2020 1221   APPEARANCEUR CLEAR 05/14/2020 1221   LABSPEC 1.005 05/14/2020 1221   PHURINE 7.0 05/14/2020 1221   GLUCOSEU NEGATIVE 05/14/2020 1221   HGBUR SMALL (A) 05/14/2020 1221   BILIRUBINUR NEGATIVE 05/14/2020 1221   KETONESUR NEGATIVE 05/14/2020 1221   PROTEINUR NEGATIVE 05/14/2020 1221   NITRITE NEGATIVE 05/14/2020 1221   LEUKOCYTESUR NEGATIVE 05/14/2020 1221   Sepsis Labs Invalid input(s): PROCALCITONIN,  WBC,  LACTICIDVEN Microbiology Recent Results (from the past 240 hour(s))  Resp Panel by RT-PCR (Flu A&B, Covid) Nasopharyngeal Swab     Status: None   Collection Time: 09/24/20  6:40 PM   Specimen: Nasopharyngeal Swab; Nasopharyngeal(NP) swabs in vial transport medium  Result Value Ref Range Status   SARS Coronavirus 2 by RT PCR NEGATIVE NEGATIVE Final    Comment: (NOTE) SARS-CoV-2 target nucleic acids are NOT DETECTED.  The SARS-CoV-2 RNA is generally detectable in upper respiratory specimens during the acute phase of infection. The lowest concentration of SARS-CoV-2 viral copies this assay can detect is 138 copies/mL. A negative result does not preclude SARS-Cov-2 infection and should not be used as the sole basis for  treatment or other patient management decisions. A negative result may occur with  improper specimen collection/handling, submission of specimen other  than nasopharyngeal swab, presence of viral mutation(s) within the areas targeted by this assay, and inadequate number of viral copies(<138 copies/mL). A negative result must be combined with clinical observations, patient history, and epidemiological information. The expected result is Negative.  Fact Sheet for Patients:  BloggerCourse.com  Fact Sheet for Healthcare Providers:  SeriousBroker.it  This test is no t yet approved or cleared by the [place] FDA and  has been authorized for detection and/or diagnosis of SARS-CoV-2 by FDA under an Emergency Use Authorization (EUA). This EUA will remain  in effect (meaning this test can be used) for the duration of the COVID-19 declaration under Section 564(b)(1) of the Act, 21 U.S.C.section 360bbb-3(b)(1), unless the authorization is terminated  or revoked sooner.       Influenza A by PCR NEGATIVE NEGATIVE Final   Influenza B by PCR NEGATIVE NEGATIVE Final    Comment: (NOTE) The Xpert Xpress SARS-CoV-2/FLU/RSV plus assay is intended as an aid in the diagnosis of influenza from Nasopharyngeal swab specimens and should not be used as a sole basis for treatment. Nasal washings and aspirates are unacceptable for Xpert Xpress SARS-CoV-2/FLU/RSV testing.  Fact Sheet for Patients: BloggerCourse.com  Fact Sheet for Healthcare Providers: SeriousBroker.it  This test is not yet approved or cleared by the [place] FDA and has been authorized for detection and/or diagnosis of SARS-CoV-2 by FDA under an Emergency Use Authorization (EUA). This EUA will remain in effect (meaning this test can be used) for the duration of the COVID-19 declaration under Section 564(b)(1) of the Act, 21  U.S.C. section 360bbb-3(b)(1), unless the authorization is terminated or revoked.  Performed at [facility], [address] [place], [place] [address]      Time coordinating discharge: Over 30 minutes  SIGNED:   Ollen Bowl, MD  [facility] 10/02/2020, 2:39 PM Pager   If 7PM-7AM, please contact night-coverage [email or web address]

## 2020-10-03 NOTE — Progress Notes (Signed)
Patient discharged and transported by PTA to facility, belonging on stretcher with patient.

## 2020-10-05 DIAGNOSIS — E559 Vitamin D deficiency, unspecified: Secondary | ICD-10-CM | POA: Diagnosis not present

## 2020-10-05 DIAGNOSIS — F0390 Unspecified dementia without behavioral disturbance: Secondary | ICD-10-CM | POA: Diagnosis not present

## 2020-10-05 DIAGNOSIS — S72402A Unspecified fracture of lower end of left femur, initial encounter for closed fracture: Secondary | ICD-10-CM | POA: Diagnosis not present

## 2020-10-05 DIAGNOSIS — D649 Anemia, unspecified: Secondary | ICD-10-CM | POA: Diagnosis not present

## 2020-10-05 DIAGNOSIS — F32A Depression, unspecified: Secondary | ICD-10-CM | POA: Diagnosis not present

## 2020-10-05 DIAGNOSIS — G459 Transient cerebral ischemic attack, unspecified: Secondary | ICD-10-CM | POA: Diagnosis not present

## 2020-10-05 DIAGNOSIS — W19XXXA Unspecified fall, initial encounter: Secondary | ICD-10-CM | POA: Diagnosis not present

## 2020-10-09 ENCOUNTER — Other Ambulatory Visit: Payer: Self-pay

## 2020-10-09 ENCOUNTER — Non-Acute Institutional Stay: Payer: Medicare Other | Admitting: Hospice

## 2020-10-09 DIAGNOSIS — Z515 Encounter for palliative care: Secondary | ICD-10-CM

## 2020-10-09 DIAGNOSIS — S72402A Unspecified fracture of lower end of left femur, initial encounter for closed fracture: Secondary | ICD-10-CM | POA: Diagnosis not present

## 2020-10-09 DIAGNOSIS — S7292XD Unspecified fracture of left femur, subsequent encounter for closed fracture with routine healing: Secondary | ICD-10-CM

## 2020-10-09 NOTE — Progress Notes (Signed)
PATIENT NAME: Deanna Strong [address] [place] [address] [phone] (home)  DOB: Feb 16, 1934 MRN: [id number]  PRIMARY CARE PROVIDER:    Sherren Kerns,  [place] [address] [phone]  REFERRING PROVIDER:   Dr. Caprice Renshaw RESPONSIBLE PARTY:   Extended Emergency Contact Information Primary Emergency Contact: [place], [place] Phone: [phone] Mobile Phone: [phone] Relation: Daughter Secondary Emergency Contact: [place], [place] Phone: [phone] Relation: Daughter  I met face to face with patient and family in home/facility.  ADVANCE CARE PLANNIN/RECOMMENDATIONS/PLAN:    Visit at the request of Dr. Caprice Renshaw for palliative consult. Visit consisted of building trust and discussions on Palliative Medicine as specialized medical care for people living with serious illness, aimed at facilitating better quality of life through symptoms relief, assisting with advance care plan and establishing complex decision making.   Discussion on the difference between Palliative and Hospice care. Palliative care and hospice have similar goals of managing symptoms, promoting comfort, improving quality of life, and maintaining a person's dignity. However, palliative care may be offered during any phase of a serious illness, while hospice care is usually offered when a person is expected to live for 6 months or less. Patient endorsed palliative service but did not want to be bothered.   CODE STATUS: Patient is a DO NOT RESUSCITATE.  Signed DNR form in facility records; same document uploaded to epic today  GOALS OF CARE: Goals of care include to maximize quality of life and symptom management.  Follow up Palliative Care Visit: Follow up in a month/as needed Refusesnmeds because she doew not want to ptolong god's processPalliative care will continue to follow for goals of care clarification and symptom management.   Symptom Management:  Patient in SNF for rehab following intramedullary retrograde femoral nailing  Following closed fracture of left femur. PT/OT is ongoing. Patient denied pain/discomfort. Patient refuses to take medications for religious reasons, per nursing. She does not want to be bothered. NP called Arbie Cookey and left her a voicemail with call back number. NP encouraged nursing to  represent her medications reinforcing education on the need; to monitor patient closely for needs and alert provider with any concerns. Weakness: Continue ongoing PT/OT for strengthening. Patient in no acute distress; resting in bed. Palliative will continue to monitor for symptom management/decline and make recommendations as needed.   Family /Caregiver/Community Supports: Patient in SNF for rehab  I spent one hour and 16 minutes providing this initial consultation; time includes time spent with patient/family, chart review, provider coordination,  and documentation. More than 50% of the time in this consultation was spent on counseling patient and coordinating communication.   CHIEF COMPLAIN/HISTORY OF PRESENT ILLNESS:  Deanna Strong is a 86 y.o. female with multiple medical problems including hip fracture, Dementia. History obtained from review of EMR, discussion with primary team/primary nurse and interview with patient.   Palliative Care was asked to follow this patient by consultation request of Dr. Caprice Renshaw to help address advance care planning and complex decision making. Thank you for the opportunity to participate in the care of Deanna Strong    CODE STATUS: DNR  PPS: 30%  HOSPICE ELIGIBILITY/DIAGNOSIS: TBD  PAST MEDICAL HISTORY:  Past Medical History:  Diagnosis Date  . Anemia   . Dementia ([place])   . Depression   . Hyperlipidemia   . Hyperthyroidism   . IBS (irritable bowel syndrome)   . Psychosis ([place]) 06/01/2018  . Strabismus     SOCIAL HX:  Social History   Tobacco Use  . 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  Smokeless tobacco: Never Used  Substance Use Topics  . Alcohol use: Never   FAMILY HX:  Family History  Family history unknown: Yes    ALLERGIES: No Known Allergies   PERTINENT MEDICATIONS:  Outpatient Encounter Medications as of 10/09/2020  Medication Sig  . acetaminophen (TYLENOL) 325 MG tablet Take 1-2 tablets (325-650 mg total) by mouth every 6 (six) hours as needed for mild pain (pain score 1-3 or temp > 100.5).  Marland Kitchen alum & mag hydroxide-simeth (MINTOX) 138-871-95 MG/5ML suspension Take 30 mLs by mouth every 6 (six) hours as needed for indigestion or heartburn.  Marland Kitchen ascorbic acid (VITAMIN C) 500 MG tablet Take 1 tablet (500 mg total) by mouth daily.  . Cholecalciferol (VITAMIN D) 125 MCG (5000 UT) CAPS Take 1 capsule by mouth daily.  . ciclopirox (LOPROX) 0.77 % cream Apply 1 application topically 2 (two) times daily. Apply to feet for fungal infection  . guaiFENesin (ROBITUSSIN) 100 MG/5ML SOLN Take 10 mLs by mouth every 6 (six) hours as needed for cough or to loosen phlegm.   . haloperidol lactate (HALDOL) 5 MG/ML injection Inject 0.4 mLs (2 mg total) into the vein every 6 (six) hours as needed.  . loperamide (IMODIUM) 2 MG capsule Take 2 mg by mouth daily as needed for diarrhea or loose stools.   . magnesium hydroxide (MILK OF MAGNESIA) 400 MG/5ML suspension Take 30 mLs by mouth daily as needed for mild constipation.  . Multiple Vitamin (MULTIVITAMIN WITH MINERALS) TABS tablet Take 1 tablet by mouth daily.  Marland Kitchen neomycin-bacitracin-polymyxin (NEOSPORIN) OINT Apply 1 application topically daily as needed for wound care.   Marland Kitchen OVER THE COUNTER MEDICATION Take 1 tablet by mouth 2 (two) times daily. Digestive enzyme all natural  . Vitamin D, Ergocalciferol, (DRISDOL) 1.25 MG (50000 UNIT) CAPS capsule Take 1 capsule (50,000 Units total) by mouth every 7 (seven) days.   No facility-administered encounter medications on file as of 10/09/2020.    Note: Portions of this note were generated with  Lobbyist. Dictation errors may occur despite best attempts at proofreading.  Teodoro Spray, NP

## 2020-10-15 DIAGNOSIS — E559 Vitamin D deficiency, unspecified: Secondary | ICD-10-CM | POA: Diagnosis not present

## 2020-10-15 DIAGNOSIS — G459 Transient cerebral ischemic attack, unspecified: Secondary | ICD-10-CM | POA: Diagnosis not present

## 2020-10-15 DIAGNOSIS — S72402A Unspecified fracture of lower end of left femur, initial encounter for closed fracture: Secondary | ICD-10-CM | POA: Diagnosis not present

## 2020-10-15 DIAGNOSIS — F0390 Unspecified dementia without behavioral disturbance: Secondary | ICD-10-CM | POA: Diagnosis not present

## 2020-10-15 DIAGNOSIS — D649 Anemia, unspecified: Secondary | ICD-10-CM | POA: Diagnosis not present

## 2020-10-15 DIAGNOSIS — F32A Depression, unspecified: Secondary | ICD-10-CM | POA: Diagnosis not present

## 2020-10-15 DIAGNOSIS — Z5320 Procedure and treatment not carried out because of patient's decision for unspecified reasons: Secondary | ICD-10-CM | POA: Diagnosis not present

## 2020-10-19 DIAGNOSIS — S72402A Unspecified fracture of lower end of left femur, initial encounter for closed fracture: Secondary | ICD-10-CM | POA: Diagnosis not present

## 2020-10-19 DIAGNOSIS — D649 Anemia, unspecified: Secondary | ICD-10-CM | POA: Diagnosis not present

## 2020-10-19 DIAGNOSIS — E559 Vitamin D deficiency, unspecified: Secondary | ICD-10-CM | POA: Diagnosis not present

## 2020-10-19 DIAGNOSIS — F0390 Unspecified dementia without behavioral disturbance: Secondary | ICD-10-CM | POA: Diagnosis not present

## 2020-10-19 DIAGNOSIS — W19XXXA Unspecified fall, initial encounter: Secondary | ICD-10-CM | POA: Diagnosis not present

## 2020-10-19 DIAGNOSIS — F32A Depression, unspecified: Secondary | ICD-10-CM | POA: Diagnosis not present

## 2020-11-05 DIAGNOSIS — F0390 Unspecified dementia without behavioral disturbance: Secondary | ICD-10-CM | POA: Diagnosis not present

## 2020-11-05 DIAGNOSIS — S72402A Unspecified fracture of lower end of left femur, initial encounter for closed fracture: Secondary | ICD-10-CM | POA: Diagnosis not present

## 2020-11-05 DIAGNOSIS — F32A Depression, unspecified: Secondary | ICD-10-CM | POA: Diagnosis not present

## 2020-11-05 DIAGNOSIS — G459 Transient cerebral ischemic attack, unspecified: Secondary | ICD-10-CM | POA: Diagnosis not present

## 2020-11-05 DIAGNOSIS — D649 Anemia, unspecified: Secondary | ICD-10-CM | POA: Diagnosis not present

## 2020-11-05 DIAGNOSIS — E559 Vitamin D deficiency, unspecified: Secondary | ICD-10-CM | POA: Diagnosis not present

## 2020-11-16 ENCOUNTER — Other Ambulatory Visit: Payer: Self-pay

## 2020-11-16 ENCOUNTER — Non-Acute Institutional Stay: Payer: Medicare Other | Admitting: Hospice

## 2020-11-16 DIAGNOSIS — Z515 Encounter for palliative care: Secondary | ICD-10-CM | POA: Diagnosis not present

## 2020-11-16 DIAGNOSIS — S7292XD Unspecified fracture of left femur, subsequent encounter for closed fracture with routine healing: Secondary | ICD-10-CM

## 2020-11-16 NOTE — Progress Notes (Addendum)
[facility] Consult Note Telephone: [phone]  Fax: [phone]  PATIENT NAME: Deanna Strong DOB: 05/15/1934 MRN: [id number]  PRIMARY CARE PROVIDER:   [facility], [facility] [place],  [place] [address]  REFERRING PROVIDER:   Dr. Caprice Renshaw RESPONSIBLE PARTY:   Extended Emergency Contact Information Primary Emergency Contact: [place], [place] Phone: [phone] Mobile Phone: [phone] Relation: Daughter Secondary Emergency Contact: [place], [place] Phone: [phone] Relation: Daughter  I met face to face with patient and family in home/facility.   RECOMMENDATIONS/PLAN:   Visit to be build trust and follow up on palliative care  ACODE STATUS:  CODE STATUS reviewed.  Patient is a DO NOT RESUSCITATE.  Signed DNR form in facility records and in [place].  She reiterated that she does not want any heroic measures to keep her alive.  GOALS OF CARE: Goals of care include to maximize quality of life and symptom management.  Patient said that her goal is also to go back to her creator; she does not want anyone trying to stop her by giving her medications.  NP called Arbie Cookey and left a voicemail with callback number. Arbie Cookey called back and was updated on visit. She knows patient refuses her medication and on board with recommendation to contiinue offering.   Follow up Palliative Care Visit: Follow up in 2 months/as needed.  Palliative care will continue to follow for goals of care clarification and symptom management.   Symptom Management: Patient in SNF for rehab following intramedullary retrograde femoral nailing; not working with PT/OT. Patient continues to refuse her medications, claining God is healing her and she is ready to go back to him anytime he wants her back. Collaboration with facility Mechele Claude NP - staff will continue to present medications and persuade patient; nursing   to monitor patient closely for needs and alert provider with any concerns. Psych consult is recommended.   Patient is now ambulatory, an improvement from last visit. She denies pain/discomfort, pleasant but will not allow physical exam. FLACC 0.   Patient refuses to take medications for religious reasons. She does not want to be bothered. NP called Arbie Cookey and left her a voicemail with call back number; Palliative will continue to monitor for symptom management/decline and make recommendations as needed.   Family /Caregiver/Community Supports: Patient in SNF for rehab  I spent 4 minutes providing this consultation; time includes time spent with patient/family, chart review, provider coordination,  and documentation. More than 50% of the time in this consultation was spent on counseling patient and coordinating communication.   CHIEF COMPLAIN/HISTORY OF PRESENT ILLNESS:  Deanna Strong is a 87 y.o. female with multiple medical problems including hip fracture, Dementia. History obtained from review of EMR, discussion with primary team/primary nurse and interview with patent.   Palliative Care was asked to follow this patient by consultation request of Dr. Caprice Renshaw to help address advance care planning and complex decision making. Thank you for the opportunity to participate in the care of [place]: DNR  PPS: 50% up from 30% last visit  HOSPICE ELIGIBILITY/DIAGNOSIS: TBD  PAST MEDICAL HISTORY:  Past Medical History:  Diagnosis Date  . Anemia   . Dementia ([place])   . Depression   . Hyperlipidemia   . Hyperthyroidism   . IBS (irritable bowel syndrome)   . Psychosis ([place]) 06/01/2018  . Strabismus     SOCIAL HX:  Social History  Tobacco Use  . 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 Smokeless tobacco: Never Used  Substance Use Topics  . Alcohol use: Never    ALLERGIES: No Known Allergies   PERTINENT MEDICATIONS:  Outpatient Encounter Medications as of 11/16/2020   Medication Sig  . acetaminophen (TYLENOL) 325 MG tablet Take 1-2 tablets (325-650 mg total) by mouth every 6 (six) hours as needed for mild pain (pain score 1-3 or temp > 100.5).  Marland Kitchen alum & mag hydroxide-simeth (MINTOX) 488-891-69 MG/5ML suspension Take 30 mLs by mouth every 6 (six) hours as needed for indigestion or heartburn.  Marland Kitchen ascorbic acid (VITAMIN C) 500 MG tablet Take 1 tablet (500 mg total) by mouth daily.  . Cholecalciferol (VITAMIN D) 125 MCG (5000 UT) CAPS Take 1 capsule by mouth daily.  . ciclopirox (LOPROX) 0.77 % cream Apply 1 application topically 2 (two) times daily. Apply to feet for fungal infection  . guaiFENesin (ROBITUSSIN) 100 MG/5ML SOLN Take 10 mLs by mouth every 6 (six) hours as needed for cough or to loosen phlegm.   . haloperidol lactate (HALDOL) 5 MG/ML injection Inject 0.4 mLs (2 mg total) into the vein every 6 (six) hours as needed.  . loperamide (IMODIUM) 2 MG capsule Take 2 mg by mouth daily as needed for diarrhea or loose stools.   . magnesium hydroxide (MILK OF MAGNESIA) 400 MG/5ML suspension Take 30 mLs by mouth daily as needed for mild constipation.  . Multiple Vitamin (MULTIVITAMIN WITH MINERALS) TABS tablet Take 1 tablet by mouth daily.  Marland Kitchen neomycin-bacitracin-polymyxin (NEOSPORIN) OINT Apply 1 application topically daily as needed for wound care.   Marland Kitchen OVER THE COUNTER MEDICATION Take 1 tablet by mouth 2 (two) times daily. Digestive enzyme all natural  . Vitamin D, Ergocalciferol, (DRISDOL) 1.25 MG (50000 UNIT) CAPS capsule Take 1 capsule (50,000 Units total) by mouth every 7 (seven) days.   No facility-administered encounter medications on file as of 11/16/2020.    Note:  Portions of this note were generated with Lobbyist. Dictation errors may occur despite attempts at proofreading.  Teodoro Spray, NP

## 2020-11-26 DIAGNOSIS — Z5320 Procedure and treatment not carried out because of patient's decision for unspecified reasons: Secondary | ICD-10-CM | POA: Diagnosis not present

## 2020-11-26 DIAGNOSIS — E559 Vitamin D deficiency, unspecified: Secondary | ICD-10-CM | POA: Diagnosis not present

## 2020-11-26 DIAGNOSIS — D649 Anemia, unspecified: Secondary | ICD-10-CM | POA: Diagnosis not present

## 2020-11-26 DIAGNOSIS — F32A Depression, unspecified: Secondary | ICD-10-CM | POA: Diagnosis not present

## 2020-11-26 DIAGNOSIS — S72402A Unspecified fracture of lower end of left femur, initial encounter for closed fracture: Secondary | ICD-10-CM | POA: Diagnosis not present

## 2020-11-26 DIAGNOSIS — G459 Transient cerebral ischemic attack, unspecified: Secondary | ICD-10-CM | POA: Diagnosis not present

## 2020-11-26 DIAGNOSIS — F0390 Unspecified dementia without behavioral disturbance: Secondary | ICD-10-CM | POA: Diagnosis not present

## 2020-11-26 DIAGNOSIS — W19XXXA Unspecified fall, initial encounter: Secondary | ICD-10-CM | POA: Diagnosis not present

## 2020-12-06 DIAGNOSIS — Z20828 Contact with and (suspected) exposure to other viral communicable diseases: Secondary | ICD-10-CM | POA: Diagnosis not present

## 2020-12-10 DIAGNOSIS — Z20828 Contact with and (suspected) exposure to other viral communicable diseases: Secondary | ICD-10-CM | POA: Diagnosis not present

## 2020-12-11 DIAGNOSIS — I679 Cerebrovascular disease, unspecified: Secondary | ICD-10-CM | POA: Diagnosis not present

## 2020-12-11 DIAGNOSIS — E559 Vitamin D deficiency, unspecified: Secondary | ICD-10-CM | POA: Diagnosis not present

## 2020-12-11 DIAGNOSIS — F0390 Unspecified dementia without behavioral disturbance: Secondary | ICD-10-CM | POA: Diagnosis not present

## 2020-12-13 DIAGNOSIS — Z20828 Contact with and (suspected) exposure to other viral communicable diseases: Secondary | ICD-10-CM | POA: Diagnosis not present

## 2020-12-17 DIAGNOSIS — Z20828 Contact with and (suspected) exposure to other viral communicable diseases: Secondary | ICD-10-CM | POA: Diagnosis not present

## 2020-12-20 DIAGNOSIS — Z20828 Contact with and (suspected) exposure to other viral communicable diseases: Secondary | ICD-10-CM | POA: Diagnosis not present

## 2020-12-28 DIAGNOSIS — F29 Unspecified psychosis not due to a substance or known physiological condition: Secondary | ICD-10-CM | POA: Diagnosis not present

## 2020-12-28 DIAGNOSIS — F0390 Unspecified dementia without behavioral disturbance: Secondary | ICD-10-CM | POA: Diagnosis not present

## 2020-12-28 DIAGNOSIS — Z5320 Procedure and treatment not carried out because of patient's decision for unspecified reasons: Secondary | ICD-10-CM | POA: Diagnosis not present

## 2020-12-30 DIAGNOSIS — Z20828 Contact with and (suspected) exposure to other viral communicable diseases: Secondary | ICD-10-CM | POA: Diagnosis not present

## 2021-01-07 DIAGNOSIS — G459 Transient cerebral ischemic attack, unspecified: Secondary | ICD-10-CM | POA: Diagnosis not present

## 2021-01-07 DIAGNOSIS — E559 Vitamin D deficiency, unspecified: Secondary | ICD-10-CM | POA: Diagnosis not present

## 2021-01-07 DIAGNOSIS — F0390 Unspecified dementia without behavioral disturbance: Secondary | ICD-10-CM | POA: Diagnosis not present

## 2021-01-08 DIAGNOSIS — Z20828 Contact with and (suspected) exposure to other viral communicable diseases: Secondary | ICD-10-CM | POA: Diagnosis not present

## 2021-01-14 DIAGNOSIS — Z20828 Contact with and (suspected) exposure to other viral communicable diseases: Secondary | ICD-10-CM | POA: Diagnosis not present

## 2021-01-24 DIAGNOSIS — R419 Unspecified symptoms and signs involving cognitive functions and awareness: Secondary | ICD-10-CM | POA: Diagnosis not present

## 2021-01-24 DIAGNOSIS — F419 Anxiety disorder, unspecified: Secondary | ICD-10-CM | POA: Diagnosis not present

## 2021-02-05 DIAGNOSIS — K5900 Constipation, unspecified: Secondary | ICD-10-CM | POA: Diagnosis not present

## 2021-02-05 DIAGNOSIS — R52 Pain, unspecified: Secondary | ICD-10-CM | POA: Diagnosis not present

## 2021-02-05 DIAGNOSIS — R419 Unspecified symptoms and signs involving cognitive functions and awareness: Secondary | ICD-10-CM | POA: Diagnosis not present

## 2021-02-05 DIAGNOSIS — F419 Anxiety disorder, unspecified: Secondary | ICD-10-CM | POA: Diagnosis not present

## 2021-02-05 DIAGNOSIS — F0390 Unspecified dementia without behavioral disturbance: Secondary | ICD-10-CM | POA: Diagnosis not present

## 2021-02-18 DIAGNOSIS — Z20828 Contact with and (suspected) exposure to other viral communicable diseases: Secondary | ICD-10-CM | POA: Diagnosis not present

## 2021-02-21 DIAGNOSIS — R419 Unspecified symptoms and signs involving cognitive functions and awareness: Secondary | ICD-10-CM | POA: Diagnosis not present

## 2021-02-21 DIAGNOSIS — F419 Anxiety disorder, unspecified: Secondary | ICD-10-CM | POA: Diagnosis not present

## 2021-02-22 ENCOUNTER — Other Ambulatory Visit: Payer: Self-pay

## 2021-02-22 ENCOUNTER — Non-Acute Institutional Stay: Payer: Medicare Other | Admitting: Hospice

## 2021-02-22 DIAGNOSIS — F0391 Unspecified dementia with behavioral disturbance: Secondary | ICD-10-CM | POA: Diagnosis not present

## 2021-02-22 DIAGNOSIS — Z515 Encounter for palliative care: Secondary | ICD-10-CM | POA: Diagnosis not present

## 2021-02-22 DIAGNOSIS — F03911 Unspecified dementia, unspecified severity, with agitation: Secondary | ICD-10-CM

## 2021-02-22 NOTE — Progress Notes (Signed)
Therapist, nutritional Palliative Care Consult Note Telephone: [phone]  Fax: [phone]  PATIENT NAME: Deanna Strong DOB: Nov 10, 1933 MRN: [id number]  PRIMARY CARE PROVIDER:   [name], [facility] [address] [place],  [place] [address]  REFERRING PROVIDER: [place], [facility] [place], [facility] [address] [place],  [place] [address]  RESPONSIBLE PARTY:   Contact Information    Name Relation Home Work Irvona Daughter [phone]  [phone]   [place], Gossen Daughter [phone]          Visit is to build trust and highlight Palliative Medicine as specialized medical care for people living with serious illness, aimed at facilitating better quality of life through symptoms relief, assisting with advance care planning and complex medical decision making.   RECOMMENDATIONS/PLAN:   Advance Care Planning/Code Status: DNR  Goals of Care: Comfort care  Visit consisted of counseling and education dealing with the complex and emotionally intense issues of symptom management and palliative care in the setting of serious and potentially life-threatening illness. Palliative care team will continue to support patient, patient's family, and medical team.  Symptom management/Plan:  Agitation: Likely related to dementia.  Continue Risperdone, Lorazepam as ordered.  Psych consult as needed.  Patient often refuses care and medications.  Continue to offer, calm approach, distraction, de-escalation techniques. Constipation: Milk of Mag as ordered as needed constipation. Dementia: Fast 6D, FLACC 0, continue ongoing supportive care. Ensure ongoing supportive care.  Patient, comfort care.  Follow up: Palliative care will continue to follow for complex medical decision making, advance care planning, and clarification of goals. Return 6 weeks or prn. Encouraged to call provider sooner with any  concerns.  CHIEF COMPLAINT: Palliative follow up visit/agitation  HISTORY OF PRESENT ILLNESS:  Deanna Strong a 87 y.o. female with multiple medical problems including agitation related to dementia which is chronic, worsening in the past 2 weeks as patient refuses care, mostly refuses her medications, lashes out to hit caregivers.  Agitation impairs her quality of life.  Agitation is aggravated during patient's care she often refuses care.  Distraction is sometimes helpful not always.  Ativan cream is also helpful.  Today, patient is guarded refuses to talk, nodding a few, but listened, refused physical exam.  Review of systems from patient, nursing staff and review of facility records.  Patient is followed by psych.  Patient in no acute distress, FLACC 0.  History dementia with behavioral disturbance, psychosis, hypertension, femoral fracture-left.  History obtained from review of EMR, discussion with primary team, family, caregiver  and/or patient. Records reviewed and summarized above. All 10 point systems reviewed and are negative except as documented in history of present illness above  Review and summarization of Epic records shows history from other than patient.   Palliative Care was asked to follow this patient by consultation request of Dr. Charlynne Pander to help address complex decision making in the context of advance care planning and goals of care clarification.   CODE STATUS: DNR  PPS: 50%  HOSPICE ELIGIBILITY/DIAGNOSIS: TBD  PAST MEDICAL HISTORY:  Past Medical History:  Diagnosis Date  . Anemia   . Dementia ([facility])   . Depression   . Hyperlipidemia   . Hyperthyroidism   . IBS (irritable bowel syndrome)   . Psychosis ([facility]) 06/01/2018  . Strabismus      SOCIAL HX: @SOCX  Patient lives at SNF for ongoing care    FAMILY HX:  Family History  Family history unknown: Yes    Review lab tests/diagnostics Patient is comfort care. No available labs for review  ALLERGIES: No Known  Allergies    PERTINENT MEDICATIONS:  Outpatient Encounter Medications as of 02/22/2021  Medication Sig  . acetaminophen (TYLENOL) 325 MG tablet Take 1-2 tablets (325-650 mg total) by mouth every 6 (six) hours as needed for mild pain (pain score 1-3 or temp > 100.5).  Marland Kitchen alum & mag hydroxide-simeth (MINTOX) 200-200-20 MG/5ML suspension Take 30 mLs by mouth every 6 (six) hours as needed for indigestion or heartburn.  Marland Kitchen ascorbic acid (VITAMIN C) 500 MG tablet Take 1 tablet (500 mg total) by mouth daily.  . Cholecalciferol (VITAMIN D) 125 MCG (5000 UT) CAPS Take 1 capsule by mouth daily.  . ciclopirox (LOPROX) 0.77 % cream Apply 1 application topically 2 (two) times daily. Apply to feet for fungal infection  . guaiFENesin (ROBITUSSIN) 100 MG/5ML SOLN Take 10 mLs by mouth every 6 (six) hours as needed for cough or to loosen phlegm.   . haloperidol lactate (HALDOL) 5 MG/ML injection Inject 0.4 mLs (2 mg total) into the vein every 6 (six) hours as needed.  . loperamide (IMODIUM) 2 MG capsule Take 2 mg by mouth daily as needed for diarrhea or loose stools.   . magnesium hydroxide (MILK OF MAGNESIA) 400 MG/5ML suspension Take 30 mLs by mouth daily as needed for mild constipation.  . Multiple Vitamin (MULTIVITAMIN WITH MINERALS) TABS tablet Take 1 tablet by mouth daily.  Marland Kitchen neomycin-bacitracin-polymyxin (NEOSPORIN) OINT Apply 1 application topically daily as needed for wound care.   Marland Kitchen OVER THE COUNTER MEDICATION Take 1 tablet by mouth 2 (two) times daily. Digestive enzyme all natural  . Vitamin D, Ergocalciferol, (DRISDOL) 1.25 MG (50000 UNIT) CAPS capsule Take 1 capsule (50,000 Units total) by mouth every 7 (seven) days.   No facility-administered encounter medications on file as of 02/22/2021.      ROS General: NAD, appropriately dressed Constitution: Denies fever/chills EYES: denies vision changes ENMT: denies Xerostomia,  dysphagia Cardiovascular: denies chest pain Pulmonary: denies  cough, denies  dyspnea  Abdomen: endorses fair appetite, denies constipation or diarrhea GU: denies dysuria MSK:  endorses ROM limitations, no falls reported Skin: denies rashes/bruising Neurological: endorses weakness, denies pain, denies insomnia Heme/lymph/immuno: denies bruises, no abnormal bleeding  I spent  55 minutes providing this consultation; this includes time spent with patient/family, chart review and documentation. More than 50% of the time in this consultation was spent on counseling and coordinating communication   Thank you for the opportunity to participate in the care of Latisa Belay Please call our office at [phone] if we can be of additional assistance.  Note: Portions of this note were generated with Scientist, clinical (histocompatibility and immunogenetics). Dictation errors may occur despite best attempts at proofreading.  Rosaura Carpenter, NP

## 2021-03-07 DIAGNOSIS — R419 Unspecified symptoms and signs involving cognitive functions and awareness: Secondary | ICD-10-CM | POA: Diagnosis not present

## 2021-03-07 DIAGNOSIS — F419 Anxiety disorder, unspecified: Secondary | ICD-10-CM | POA: Diagnosis not present

## 2021-04-01 DIAGNOSIS — G459 Transient cerebral ischemic attack, unspecified: Secondary | ICD-10-CM | POA: Diagnosis not present

## 2021-04-01 DIAGNOSIS — F32A Depression, unspecified: Secondary | ICD-10-CM | POA: Diagnosis not present

## 2021-04-01 DIAGNOSIS — E559 Vitamin D deficiency, unspecified: Secondary | ICD-10-CM | POA: Diagnosis not present

## 2021-04-01 DIAGNOSIS — D649 Anemia, unspecified: Secondary | ICD-10-CM | POA: Diagnosis not present

## 2021-04-01 DIAGNOSIS — I679 Cerebrovascular disease, unspecified: Secondary | ICD-10-CM | POA: Diagnosis not present

## 2021-04-01 DIAGNOSIS — K5900 Constipation, unspecified: Secondary | ICD-10-CM | POA: Diagnosis not present

## 2021-04-16 ENCOUNTER — Other Ambulatory Visit: Payer: Self-pay

## 2021-04-16 ENCOUNTER — Non-Acute Institutional Stay: Payer: Medicare Other | Admitting: Hospice

## 2021-04-16 DIAGNOSIS — Z515 Encounter for palliative care: Secondary | ICD-10-CM | POA: Diagnosis not present

## 2021-04-16 DIAGNOSIS — F03911 Unspecified dementia, unspecified severity, with agitation: Secondary | ICD-10-CM

## 2021-04-16 DIAGNOSIS — F0391 Unspecified dementia with behavioral disturbance: Secondary | ICD-10-CM

## 2021-04-16 NOTE — Progress Notes (Signed)
Therapist, nutritional Palliative Care Consult Note Telephone: [phone]  Fax: [phone]  PATIENT NAME: Deanna Strong DOB: [birth year] MRN: [id number]  PRIMARY CARE PROVIDER:   [name], [facility] [address] [place],  [place] [address]  REFERRING PROVIDER: [place], [facility] [place], [facility] [address] [place],  [place] [address]  RESPONSIBLE PARTY:  Terrence Dupont Information     Name Relation Home Work Mobile   Christabella, Alvira Daughter [phone]  [phone]   Dreana, Britz Daughter [phone]         Visit is to build trust and highlight Palliative Medicine as specialized medical care for people living with serious illness, aimed at facilitating better quality of life through symptoms relief, assisting with advance care planning and complex medical decision making.   RECOMMENDATIONS/PLAN:   Advance Care Planning/Code Status: Patient is a DNR   Goals of Care: Comfort care   Symptom management/Plan:  Patient continues to be resistant to care because of her religious belief.  Continue to offer care/medications.  Use calm approach, distraction, de-escalation techniques. Agitation: Likely related to dementia.  Continue Risperdone, Lorazepam as ordered.  Psych consult as needed.  Dementia: Fast 6D, FLACC 0, continue ongoing supportive care. Ensure ongoing supportive care.  Patient is comfort care only.   Follow up: Palliative care will continue to follow for complex medical decision making, advance care planning, and clarification of goals. Return 6 weeks or prn. Encouraged to call provider sooner with any concerns.   CHIEF COMPLAINT: Palliative follow up visit/agitation   HISTORY OF PRESENT ILLNESS:  Deanna Strong a 87 y.o. female with multiple medical problems including dementia with behavioral disturbance, psychosis, agitation, hypertension, femoral fracture-left.  Patient resting  in bed during visit, and has had simple questions directed at her but refused to be touched.  She states she did not trust anyone in the facility except had God who will soon come and take her home.  She denies pain/discomfort, FLACC 0.  History obtained from review of EMR, discussion with primary team, family, caregiver  and/or patient. Records reviewed and summarized above. All 10 point systems reviewed and are negative except as documented in history of present illness above   Review and summarization of Epic records shows history from other than patient.   Palliative Care was asked to follow this patient by consultation request of Dr. Charlynne Pander to help address complex decision making in the context of advance care planning and goals of care clarification.   CODE STATUS: DNR   PPS: 50%  PERTINENT MEDICATIONS:  Outpatient Encounter Medications as of 04/16/2021  Medication Sig   acetaminophen (TYLENOL) 325 MG tablet Take 1-2 tablets (325-650 mg total) by mouth every 6 (six) hours as needed for mild pain (pain score 1-3 or temp > 100.5).   alum & mag hydroxide-simeth (MINTOX) 200-200-20 MG/5ML suspension Take 30 mLs by mouth every 6 (six) hours as needed for indigestion or heartburn.   ascorbic acid (VITAMIN C) 500 MG tablet Take 1 tablet (500 mg total) by mouth daily.   Cholecalciferol (VITAMIN D) 125 MCG (5000 UT) CAPS Take 1 capsule by mouth daily.   ciclopirox (LOPROX) 0.77 % cream Apply 1 application topically 2 (two) times daily. Apply to feet for fungal infection   guaiFENesin (ROBITUSSIN) 100 MG/5ML SOLN Take 10 mLs by mouth every 6 (six) hours as needed for cough or to loosen phlegm.    haloperidol lactate (HALDOL) 5 MG/ML injection Inject 0.4  mLs (2 mg total) into the vein every 6 (six) hours as needed.   loperamide (IMODIUM) 2 MG capsule Take 2 mg by mouth daily as needed for diarrhea or loose stools.    magnesium hydroxide (MILK OF MAGNESIA) 400 MG/5ML suspension Take 30 mLs by mouth  daily as needed for mild constipation.   Multiple Vitamin (MULTIVITAMIN WITH MINERALS) TABS tablet Take 1 tablet by mouth daily.   neomycin-bacitracin-polymyxin (NEOSPORIN) OINT Apply 1 application topically daily as needed for wound care.    OVER THE COUNTER MEDICATION Take 1 tablet by mouth 2 (two) times daily. Digestive enzyme all natural   Vitamin D, Ergocalciferol, (DRISDOL) 1.25 MG (50000 UNIT) CAPS capsule Take 1 capsule (50,000 Units total) by mouth every 7 (seven) days.   No facility-administered encounter medications on file as of 04/16/2021.    HOSPICE ELIGIBILITY/DIAGNOSIS: TBD  PAST MEDICAL HISTORY:  Past Medical History:  Diagnosis Date   Anemia    Dementia ([facility])    Depression    Hyperlipidemia    Hyperthyroidism    IBS (irritable bowel syndrome)    Psychosis ([facility]) 06/01/2018   Strabismus      ALLERGIES: No Known Allergies    I spent 50 minutes providing this consultation; this includes time spent with patient, chart review and documentation. More than 50% of the time in this consultation was spent on counseling and coordinating communication   Thank you for the opportunity to participate in the care of Deanna Strong Please call our office at [phone] if we can be of additional assistance.  Note: Portions of this note were generated with Scientist, clinical (histocompatibility and immunogenetics). Dictation errors may occur despite best attempts at proofreading.  Rosaura Carpenter, NP

## 2021-04-18 DIAGNOSIS — R419 Unspecified symptoms and signs involving cognitive functions and awareness: Secondary | ICD-10-CM | POA: Diagnosis not present

## 2021-04-18 DIAGNOSIS — F419 Anxiety disorder, unspecified: Secondary | ICD-10-CM | POA: Diagnosis not present

## 2021-04-24 DIAGNOSIS — W19XXXA Unspecified fall, initial encounter: Secondary | ICD-10-CM | POA: Diagnosis not present

## 2021-04-24 DIAGNOSIS — Z79899 Other long term (current) drug therapy: Secondary | ICD-10-CM | POA: Diagnosis not present

## 2021-04-24 DIAGNOSIS — F32A Depression, unspecified: Secondary | ICD-10-CM | POA: Diagnosis not present

## 2021-04-24 DIAGNOSIS — F0390 Unspecified dementia without behavioral disturbance: Secondary | ICD-10-CM | POA: Diagnosis not present

## 2021-04-24 DIAGNOSIS — D649 Anemia, unspecified: Secondary | ICD-10-CM | POA: Diagnosis not present

## 2021-04-24 DIAGNOSIS — I679 Cerebrovascular disease, unspecified: Secondary | ICD-10-CM | POA: Diagnosis not present

## 2021-05-03 DIAGNOSIS — R419 Unspecified symptoms and signs involving cognitive functions and awareness: Secondary | ICD-10-CM | POA: Diagnosis not present

## 2021-05-03 DIAGNOSIS — F419 Anxiety disorder, unspecified: Secondary | ICD-10-CM | POA: Diagnosis not present

## 2021-05-21 DIAGNOSIS — F419 Anxiety disorder, unspecified: Secondary | ICD-10-CM | POA: Diagnosis not present

## 2021-05-21 DIAGNOSIS — R419 Unspecified symptoms and signs involving cognitive functions and awareness: Secondary | ICD-10-CM | POA: Diagnosis not present

## 2021-05-28 DIAGNOSIS — E559 Vitamin D deficiency, unspecified: Secondary | ICD-10-CM | POA: Diagnosis not present

## 2021-05-28 DIAGNOSIS — D649 Anemia, unspecified: Secondary | ICD-10-CM | POA: Diagnosis not present

## 2021-05-28 DIAGNOSIS — I679 Cerebrovascular disease, unspecified: Secondary | ICD-10-CM | POA: Diagnosis not present

## 2021-05-30 DIAGNOSIS — S728X2S Other fracture of left femur, sequela: Secondary | ICD-10-CM | POA: Diagnosis not present

## 2021-05-30 DIAGNOSIS — Z7189 Other specified counseling: Secondary | ICD-10-CM | POA: Diagnosis not present

## 2021-05-30 DIAGNOSIS — F29 Unspecified psychosis not due to a substance or known physiological condition: Secondary | ICD-10-CM | POA: Diagnosis not present

## 2021-05-30 DIAGNOSIS — I2699 Other pulmonary embolism without acute cor pulmonale: Secondary | ICD-10-CM | POA: Diagnosis not present

## 2021-05-30 DIAGNOSIS — B354 Tinea corporis: Secondary | ICD-10-CM | POA: Diagnosis not present

## 2021-06-11 ENCOUNTER — Non-Acute Institutional Stay: Payer: Medicare Other | Admitting: Hospice

## 2021-06-11 ENCOUNTER — Other Ambulatory Visit: Payer: Self-pay

## 2021-06-11 DIAGNOSIS — F0391 Unspecified dementia with behavioral disturbance: Secondary | ICD-10-CM

## 2021-06-11 DIAGNOSIS — Z515 Encounter for palliative care: Secondary | ICD-10-CM | POA: Diagnosis not present

## 2021-06-11 DIAGNOSIS — F03911 Unspecified dementia, unspecified severity, with agitation: Secondary | ICD-10-CM

## 2021-06-11 NOTE — Progress Notes (Signed)
Therapist, nutritional Palliative Care Consult Note Telephone: [phone]  Fax: [phone]  PATIENT NAME: Deanna Strong DOB: May 01, 1934 MRN: [id number]  PRIMARY CARE PROVIDER:  Dr. Charlynne Pander  REFERRING PROVIDER: Dr. Charlynne Pander  RESPONSIBLE PARTY:   Contact Information     Name Relation Home Work Mobile   Deanna, Strong Daughter [phone]  [phone]   Deanna, Strong Daughter [phone]         Visit is to build trust and highlight Palliative Medicine as specialized medical care for people living with serious illness, aimed at facilitating better quality of life through symptoms relief, assisting with advance care planning and complex medical decision making. This is a follow up visit.  RECOMMENDATIONS/PLAN:   Advance Care Planning/Code Status:Patient is a Do Not Resuscitate  Goals of Care: Goals of care include to maximize quality of life and symptom management.  Symptom management/Plan:  Nursing reports patient continues to be resistant to care because of her religious belief in the context of advance Dementia.  Continue to offer care/medications.  Use calm approach, distraction, de-escalation techniques. Psych is following patient.  Agitation: Continue Haldol as ordered Dementia: Fast 6D, FLACC 0, continue ongoing supportive care. Patient is comfort care only. Follow up: Palliative care will continue to follow for complex medical decision making, advance care planning, and clarification of goals. Return 6 weeks or prn. Encouraged to call provider sooner with any concerns.  CHIEF COMPLAINT: Palliative follow up  HISTORY OF PRESENT ILLNESS:  Deanna Strong a 87 y.o. female with multiple medical problems including dementia with behavioral disturbance, psychosis, agitation, hypertension, femoral fracture -left. Limited physical exam due to patient not wanting to be touched. History obtained from review of EMR, discussion with nursing staff, family and/or  patient. Records reviewed and summarized above. All 10 point systems reviewed and are negative except as documented in history of present illness above  Review and summarization of Epic records shows history from other than patient.   Palliative Care was asked to follow this patient o help address complex decision making in the context of advance care planning and goals of care clarification.   PHYSICAL EXAM  General: In no acute distress, appropriately dressed Pulmonary: no cough, no increased work of breathing, normal respiratory effort Musculoskeletal:  ambulatory Skin: no rash to visible skin, warm without cyanosis,  Psych: flat affect Neurological: Weakness but otherwise non focal Heme/lymph/immuno: no bruises, no bleeding  PERTINENT MEDICATIONS:  Outpatient Encounter Medications as of 06/11/2021  Medication Sig   acetaminophen (TYLENOL) 325 MG tablet Take 1-2 tablets (325-650 mg total) by mouth every 6 (six) hours as needed for mild pain (pain score 1-3 or temp > 100.5).   alum & mag hydroxide-simeth (MINTOX) 200-200-20 MG/5ML suspension Take 30 mLs by mouth every 6 (six) hours as needed for indigestion or heartburn.   ascorbic acid (VITAMIN C) 500 MG tablet Take 1 tablet (500 mg total) by mouth daily.   Cholecalciferol (VITAMIN D) 125 MCG (5000 UT) CAPS Take 1 capsule by mouth daily.   ciclopirox (LOPROX) 0.77 % cream Apply 1 application topically 2 (two) times daily. Apply to feet for fungal infection   guaiFENesin (ROBITUSSIN) 100 MG/5ML SOLN Take 10 mLs by mouth every 6 (six) hours as needed for cough or to loosen phlegm.    haloperidol lactate (HALDOL) 5 MG/ML injection Inject 0.4 mLs (2 mg total) into the vein every 6 (six) hours as needed.   loperamide (IMODIUM) 2 MG capsule Take 2 mg by mouth daily  as needed for diarrhea or loose stools.    magnesium hydroxide (MILK OF MAGNESIA) 400 MG/5ML suspension Take 30 mLs by mouth daily as needed for mild constipation.   Multiple Vitamin  (MULTIVITAMIN WITH MINERALS) TABS tablet Take 1 tablet by mouth daily.   neomycin-bacitracin-polymyxin (NEOSPORIN) OINT Apply 1 application topically daily as needed for wound care.    OVER THE COUNTER MEDICATION Take 1 tablet by mouth 2 (two) times daily. Digestive enzyme all natural   Vitamin D, Ergocalciferol, (DRISDOL) 1.25 MG (50000 UNIT) CAPS capsule Take 1 capsule (50,000 Units total) by mouth every 7 (seven) days.   No facility-administered encounter medications on file as of 06/11/2021.    HOSPICE ELIGIBILITY/DIAGNOSIS: TBD  PAST MEDICAL HISTORY:  Past Medical History:  Diagnosis Date   Anemia    Dementia ([facility])    Depression    Hyperlipidemia    Hyperthyroidism    IBS (irritable bowel syndrome)    Psychosis ([facility]) 06/01/2018   Strabismus      ALLERGIES: No Known Allergies    I spent  50 minutes providing this consultation; this includes time spent with patient/family, chart review and documentation. More than 50% of the time in this consultation was spent on counseling and coordinating communication   Thank you for the opportunity to participate in the care of Deanna Strong Please call our office at [phone] if we can be of additional assistance.  Note: Portions of this note were generated with Scientist, clinical (histocompatibility and immunogenetics). Dictation errors may occur despite best attempts at proofreading.  Rosaura Carpenter, NP

## 2021-06-25 DIAGNOSIS — F419 Anxiety disorder, unspecified: Secondary | ICD-10-CM | POA: Diagnosis not present

## 2021-06-25 DIAGNOSIS — R419 Unspecified symptoms and signs involving cognitive functions and awareness: Secondary | ICD-10-CM | POA: Diagnosis not present

## 2021-07-04 DIAGNOSIS — R419 Unspecified symptoms and signs involving cognitive functions and awareness: Secondary | ICD-10-CM | POA: Diagnosis not present

## 2021-07-04 DIAGNOSIS — F419 Anxiety disorder, unspecified: Secondary | ICD-10-CM | POA: Diagnosis not present

## 2021-07-19 DIAGNOSIS — F411 Generalized anxiety disorder: Secondary | ICD-10-CM | POA: Diagnosis not present

## 2021-07-25 DIAGNOSIS — F419 Anxiety disorder, unspecified: Secondary | ICD-10-CM | POA: Diagnosis not present

## 2021-07-25 DIAGNOSIS — F0390 Unspecified dementia without behavioral disturbance: Secondary | ICD-10-CM | POA: Diagnosis not present

## 2021-07-25 DIAGNOSIS — Z5320 Procedure and treatment not carried out because of patient's decision for unspecified reasons: Secondary | ICD-10-CM | POA: Diagnosis not present

## 2021-07-25 DIAGNOSIS — F32A Depression, unspecified: Secondary | ICD-10-CM | POA: Diagnosis not present

## 2021-07-25 DIAGNOSIS — R419 Unspecified symptoms and signs involving cognitive functions and awareness: Secondary | ICD-10-CM | POA: Diagnosis not present

## 2021-07-25 DIAGNOSIS — F411 Generalized anxiety disorder: Secondary | ICD-10-CM | POA: Diagnosis not present

## 2021-07-25 DIAGNOSIS — I679 Cerebrovascular disease, unspecified: Secondary | ICD-10-CM | POA: Diagnosis not present

## 2021-07-30 DIAGNOSIS — F411 Generalized anxiety disorder: Secondary | ICD-10-CM | POA: Diagnosis not present

## 2021-08-14 DIAGNOSIS — F411 Generalized anxiety disorder: Secondary | ICD-10-CM | POA: Diagnosis not present

## 2021-08-23 DIAGNOSIS — R419 Unspecified symptoms and signs involving cognitive functions and awareness: Secondary | ICD-10-CM | POA: Diagnosis not present

## 2021-08-23 DIAGNOSIS — F419 Anxiety disorder, unspecified: Secondary | ICD-10-CM | POA: Diagnosis not present

## 2021-08-26 DIAGNOSIS — F411 Generalized anxiety disorder: Secondary | ICD-10-CM | POA: Diagnosis not present

## 2021-10-07 DIAGNOSIS — E559 Vitamin D deficiency, unspecified: Secondary | ICD-10-CM | POA: Diagnosis not present

## 2021-10-07 DIAGNOSIS — F0390 Unspecified dementia without behavioral disturbance: Secondary | ICD-10-CM | POA: Diagnosis not present

## 2021-10-07 DIAGNOSIS — I679 Cerebrovascular disease, unspecified: Secondary | ICD-10-CM | POA: Diagnosis not present

## 2021-10-22 DIAGNOSIS — F01B11 Vascular dementia, moderate, with agitation: Secondary | ICD-10-CM | POA: Diagnosis not present

## 2021-10-22 DIAGNOSIS — F419 Anxiety disorder, unspecified: Secondary | ICD-10-CM | POA: Diagnosis not present

## 2021-11-08 ENCOUNTER — Other Ambulatory Visit: Payer: Self-pay

## 2021-11-08 ENCOUNTER — Non-Acute Institutional Stay: Payer: Medicare Other | Admitting: Hospice

## 2021-11-08 DIAGNOSIS — Z515 Encounter for palliative care: Secondary | ICD-10-CM

## 2021-11-08 DIAGNOSIS — F03911 Unspecified dementia, unspecified severity, with agitation: Secondary | ICD-10-CM | POA: Diagnosis not present

## 2021-11-08 DIAGNOSIS — F03918 Unspecified dementia, unspecified severity, with other behavioral disturbance: Secondary | ICD-10-CM | POA: Diagnosis not present

## 2021-11-08 NOTE — Progress Notes (Signed)
[facility] Consult Note Telephone: [phone]  Fax: [phone]  PATIENT NAME: Deanna Strong DOB: February 12, 1934 MRN: [id number]  PRIMARY CARE PROVIDER:  Dr. Caprice Strong  REFERRING PROVIDER: Dr. Caprice Strong  RESPONSIBLE PARTY:   Contact Information     Name Relation Home Work Mobile   Deanna Strong, Deanna Strong Daughter [phone]  [place], [place] Daughter [phone]         Visit is to build trust and highlight Palliative Medicine as specialized medical care for people living with serious illness, aimed at facilitating better quality of life through symptoms relief, assisting with advance care planning and complex medical decision making. This is a follow up visit.  RECOMMENDATIONS/PLAN:   Advance Care Planning/Code Status:Patient is a Do Not Resuscitate  Goals of Care: Goals of care include to maximize quality of life and symptom management.  Symptom management/Plan:  Dementia: Memory loss/confusion/agitation in line with disease trajectory. Continue to offer care/medications.  Use calm approach, distraction, de-escalation techniques. Psych is following patient. Fast 6D, FLACC 0, continue ongoing supportive care. Patient is comfort care only. Agitation: Continue Haldol as ordered Follow up: Palliative care will continue to follow for complex medical decision making, advance care planning, and clarification of goals. Return 6 weeks or prn. Encouraged to call provider sooner with any concerns.  CHIEF COMPLAINT: Palliative follow up  HISTORY OF PRESENT ILLNESS:  Deanna Strong a 88 y.o. female with multiple medical problems including dementia with behavioral disturbance, psychosis, agitation, followed by Psych. History of hypertension, femoral fracture -left.  Patient is uncooperative at baseline; Limited physical exam due to patient not wanting to be touched. Nursing reports they often put her medicine in her coffee - the only way she  will take. History obtained from review of EMR, discussion with nursing staff, family and/or patient. Records reviewed and summarized above. All 10 point systems reviewed and are negative except as documented in history of present illness above  Review and summarization of Epic records shows history from other than patient.   Palliative Care was asked to follow this patient o help address complex decision making in the context of advance care planning and goals of care clarification.   PHYSICAL EXAM  General: In no acute distress, appropriately dressed Pulmonary: no cough, no increased work of breathing, normal respiratory effort Musculoskeletal:  ambulatory Skin: no rash to visible skin, warm without cyanosis,  Psych: flat affect Neurological: Weakness but otherwise non focal Heme/lymph/immuno: no bruises, no bleeding  PERTINENT MEDICATIONS:  Outpatient Encounter Medications as of 11/08/2021  Medication Sig   acetaminophen (TYLENOL) 325 MG tablet Take 1-2 tablets (325-650 mg total) by mouth every 6 (six) hours as needed for mild pain (pain score 1-3 or temp > 100.5).   alum & mag hydroxide-simeth (MINTOX) I037812 MG/5ML suspension Take 30 mLs by mouth every 6 (six) hours as needed for indigestion or heartburn.   ascorbic acid (VITAMIN C) 500 MG tablet Take 1 tablet (500 mg total) by mouth daily.   Cholecalciferol (VITAMIN D) 125 MCG (5000 UT) CAPS Take 1 capsule by mouth daily.   ciclopirox (LOPROX) 0.77 % cream Apply 1 application topically 2 (two) times daily. Apply to feet for fungal infection   guaiFENesin (ROBITUSSIN) 100 MG/5ML SOLN Take 10 mLs by mouth every 6 (six) hours as needed for cough or to loosen phlegm.    haloperidol lactate (HALDOL) 5 MG/ML injection Inject 0.4 mLs (2 mg total) into the vein every 6 (six) hours as needed.  loperamide (IMODIUM) 2 MG capsule Take 2 mg by mouth daily as needed for diarrhea or loose stools.    magnesium hydroxide (MILK OF MAGNESIA) 400  MG/5ML suspension Take 30 mLs by mouth daily as needed for mild constipation.   Multiple Vitamin (MULTIVITAMIN WITH MINERALS) TABS tablet Take 1 tablet by mouth daily.   neomycin-bacitracin-polymyxin (NEOSPORIN) OINT Apply 1 application topically daily as needed for wound care.    OVER THE COUNTER MEDICATION Take 1 tablet by mouth 2 (two) times daily. Digestive enzyme all natural   Vitamin D, Ergocalciferol, (DRISDOL) 1.25 MG (50000 UNIT) CAPS capsule Take 1 capsule (50,000 Units total) by mouth every 7 (seven) days.   No facility-administered encounter medications on file as of 11/08/2021.    HOSPICE ELIGIBILITY/DIAGNOSIS: TBD  PAST MEDICAL HISTORY:  Past Medical History:  Diagnosis Date   Anemia    Dementia ([place])    Depression    Hyperlipidemia    Hyperthyroidism    IBS (irritable bowel syndrome)    Psychosis ([place]) 06/01/2018   Strabismus      ALLERGIES: No Known Allergies    I spent  30 minutes providing this consultation; this includes time spent with patient/family, chart review and documentation. More than 50% of the time in this consultation was spent on counseling and coordinating communication   Thank you for the opportunity to participate in the care of Deanna Strong Please call our office at [phone] if we can be of additional assistance.  Note: Portions of this note were generated with Lobbyist. Dictation errors may occur despite best attempts at proofreading.  Teodoro Spray, NP

## 2021-12-06 DIAGNOSIS — F419 Anxiety disorder, unspecified: Secondary | ICD-10-CM | POA: Diagnosis not present

## 2021-12-06 DIAGNOSIS — I69311 Memory deficit following cerebral infarction: Secondary | ICD-10-CM | POA: Diagnosis not present

## 2021-12-06 DIAGNOSIS — F01B11 Vascular dementia, moderate, with agitation: Secondary | ICD-10-CM | POA: Diagnosis not present

## 2021-12-11 DIAGNOSIS — Z7189 Other specified counseling: Secondary | ICD-10-CM | POA: Diagnosis not present

## 2021-12-11 DIAGNOSIS — I2699 Other pulmonary embolism without acute cor pulmonale: Secondary | ICD-10-CM | POA: Diagnosis not present

## 2021-12-11 DIAGNOSIS — B354 Tinea corporis: Secondary | ICD-10-CM | POA: Diagnosis not present

## 2021-12-11 DIAGNOSIS — S728X2S Other fracture of left femur, sequela: Secondary | ICD-10-CM | POA: Diagnosis not present

## 2021-12-11 DIAGNOSIS — K219 Gastro-esophageal reflux disease without esophagitis: Secondary | ICD-10-CM | POA: Diagnosis not present

## 2021-12-11 DIAGNOSIS — F29 Unspecified psychosis not due to a substance or known physiological condition: Secondary | ICD-10-CM | POA: Diagnosis not present

## 2022-01-03 ENCOUNTER — Non-Acute Institutional Stay: Payer: Medicare Other | Admitting: Hospice

## 2022-01-03 ENCOUNTER — Other Ambulatory Visit: Payer: Self-pay

## 2022-01-03 DIAGNOSIS — F03911 Unspecified dementia, unspecified severity, with agitation: Secondary | ICD-10-CM

## 2022-01-03 DIAGNOSIS — Z515 Encounter for palliative care: Secondary | ICD-10-CM

## 2022-01-03 DIAGNOSIS — F03918 Unspecified dementia, unspecified severity, with other behavioral disturbance: Secondary | ICD-10-CM | POA: Diagnosis not present

## 2022-01-03 NOTE — Progress Notes (Signed)
? ? ?[facility] Consult Note ?Telephone: [phone]  ?Fax: [phone] ? ?PATIENT NAME: Deanna Strong ?DOB: May 03, 1934 ?MRN: [id number] ? ?PRIMARY CARE PROVIDER:  Dr. Charlynne Pander ? ?REFERRING PROVIDER: Dr. Charlynne Pander ? ?RESPONSIBLE PARTY:   ?Contact Information   ? ? Name Relation Home Work Mobile  ? Samyrah, Bruster Daughter [phone]  [phone]  ? [place], Stewart Daughter [phone]    ? ?  ? ? ?Visit is to build trust and highlight Palliative Medicine as specialized medical care for people living with serious illness, aimed at facilitating better quality of life through symptoms relief, assisting with advance care planning and complex medical decision making. This is a follow up visit. ? ?RECOMMENDATIONS/PLAN:  ? ?Advance Care Planning/Code Status:Patient is a Do Not Resuscitate ? ?Goals of Care: Goals of care include to maximize quality of life and symptom management. ? ?Symptom management/Plan:  ?Dementia: Dementia with behavior, Memory loss/confusion/agitation in line with disease trajectory.  Fast 6D, FLACC 0, continue ongoing supportive care. ?Patient is comfort care only. ?Agitation: Related to Dementia.  Patient often refuses care/medications.  Continue to offer care/medications.  Use calm approach, distraction, de-escalation techniques. Psych is following patient. Continue Haldol, Ativan  as ordered ?Follow up: Palliative care will continue to follow for complex medical decision making, advance care planning, and clarification of goals. Return 6 weeks or prn. Encouraged to call provider sooner with any concerns. ? ?CHIEF COMPLAINT: Palliative follow up ? ?HISTORY OF PRESENT ILLNESS:  Deanna Strong a 88 y.o. female with multiple medical problems including dementia with behavioral disturbance, psychosis, agitation, followed by Psych. History of hypertension, femoral fracture -left.  Patient is uncooperative at baseline,.  Nursing reports they often put her  medicine in her coffee - the only way she will take.  Patient denied pain/discomfort, in no acute distress.  History obtained from review of EMR, discussion with nursing staff, family and/or patient. Records reviewed and summarized above. All 10 point systems reviewed and are negative except as documented in history of present illness above ? ?Review and summarization of Epic records shows history from other than patient.  ? ?Palliative Care was asked to follow this patient o help address complex decision making in the context of advance care planning and goals of care clarification.  ? ?PHYSICAL EXAM  ?General: In no acute distress, appropriately dressed ?Pulmonary: no cough, no increased work of breathing, normal respiratory effort ?Musculoskeletal:  ambulatory ?Skin: no rash to visible skin, warm without cyanosis,  ?Psych: flat affect ?Neurological: Weakness but otherwise non focal ?Heme/lymph/immuno: no bruises, no bleeding ? ?PERTINENT MEDICATIONS:  ?Outpatient Encounter Medications as of 01/03/2022  ?Medication Sig  ? acetaminophen (TYLENOL) 325 MG tablet Take 1-2 tablets (325-650 mg total) by mouth every 6 (six) hours as needed for mild pain (pain score 1-3 or temp > 100.5).  ? alum & mag hydroxide-simeth (MINTOX) 200-200-20 MG/5ML suspension Take 30 mLs by mouth every 6 (six) hours as needed for indigestion or heartburn.  ? ascorbic acid (VITAMIN C) 500 MG tablet Take 1 tablet (500 mg total) by mouth daily.  ? Cholecalciferol (VITAMIN D) 125 MCG (5000 UT) CAPS Take 1 capsule by mouth daily.  ? ciclopirox (LOPROX) 0.77 % cream Apply 1 application topically 2 (two) times daily. Apply to feet for fungal infection  ? guaiFENesin (ROBITUSSIN) 100 MG/5ML SOLN Take 10 mLs by mouth every 6 (six) hours as needed for cough or to loosen phlegm.   ? haloperidol lactate (HALDOL) 5 MG/ML injection Inject  0.4 mLs (2 mg total) into the vein every 6 (six) hours as needed.  ? loperamide (IMODIUM) 2 MG capsule Take 2 mg by mouth  daily as needed for diarrhea or loose stools.   ? magnesium hydroxide (MILK OF MAGNESIA) 400 MG/5ML suspension Take 30 mLs by mouth daily as needed for mild constipation.  ? Multiple Vitamin (MULTIVITAMIN WITH MINERALS) TABS tablet Take 1 tablet by mouth daily.  ? neomycin-bacitracin-polymyxin (NEOSPORIN) OINT Apply 1 application topically daily as needed for wound care.   ? OVER THE COUNTER MEDICATION Take 1 tablet by mouth 2 (two) times daily. Digestive enzyme all natural  ? Vitamin D, Ergocalciferol, (DRISDOL) 1.25 MG (50000 UNIT) CAPS capsule Take 1 capsule (50,000 Units total) by mouth every 7 (seven) days.  ? ?No facility-administered encounter medications on file as of 01/03/2022.  ? ? ?HOSPICE ELIGIBILITY/DIAGNOSIS: TBD ? ?PAST MEDICAL HISTORY:  ?Past Medical History:  ?Diagnosis Date  ? Anemia   ? Dementia ([facility])   ? Depression   ? Hyperlipidemia   ? Hyperthyroidism   ? IBS (irritable bowel syndrome)   ? Psychosis ([facility]) 06/01/2018  ? Strabismus   ?  ? ?ALLERGIES: No Known Allergies   ? ?I spent  45 minutes providing this consultation; this includes time spent with patient/family, chart review and documentation. More than 50% of the time in this consultation was spent on counseling and coordinating communication  ? ?Thank you for the opportunity to participate in the care of Deanna Strong Please call our office at [phone] if we can be of additional assistance. ? ?Note: Portions of this note were generated with Scientist, clinical (histocompatibility and immunogenetics). Dictation errors may occur despite best attempts at proofreading. ? ?Rosaura Carpenter, NP ? ?  ?

## 2022-01-13 DIAGNOSIS — F01B11 Vascular dementia, moderate, with agitation: Secondary | ICD-10-CM | POA: Diagnosis not present

## 2022-01-13 DIAGNOSIS — F419 Anxiety disorder, unspecified: Secondary | ICD-10-CM | POA: Diagnosis not present

## 2022-01-13 DIAGNOSIS — I69311 Memory deficit following cerebral infarction: Secondary | ICD-10-CM | POA: Diagnosis not present

## 2022-01-15 DIAGNOSIS — F0390 Unspecified dementia without behavioral disturbance: Secondary | ICD-10-CM | POA: Diagnosis not present

## 2022-01-15 DIAGNOSIS — F29 Unspecified psychosis not due to a substance or known physiological condition: Secondary | ICD-10-CM | POA: Diagnosis not present

## 2022-01-15 DIAGNOSIS — Z5320 Procedure and treatment not carried out because of patient's decision for unspecified reasons: Secondary | ICD-10-CM | POA: Diagnosis not present

## 2022-01-15 DIAGNOSIS — D649 Anemia, unspecified: Secondary | ICD-10-CM | POA: Diagnosis not present

## 2022-01-27 DIAGNOSIS — Z20822 Contact with and (suspected) exposure to covid-19: Secondary | ICD-10-CM | POA: Diagnosis not present

## 2022-02-14 IMAGING — CR DG HIP (WITH OR WITHOUT PELVIS) 2-3V*L*
5 series · 5 of 5 positions shown · non-contrast
Comparison: None.

CLINICAL DATA: Status post fall.

EXAM:
DG HIP (WITH OR WITHOUT PELVIS) 2-3V LEFT

[w hip lat left (1 of 2)]
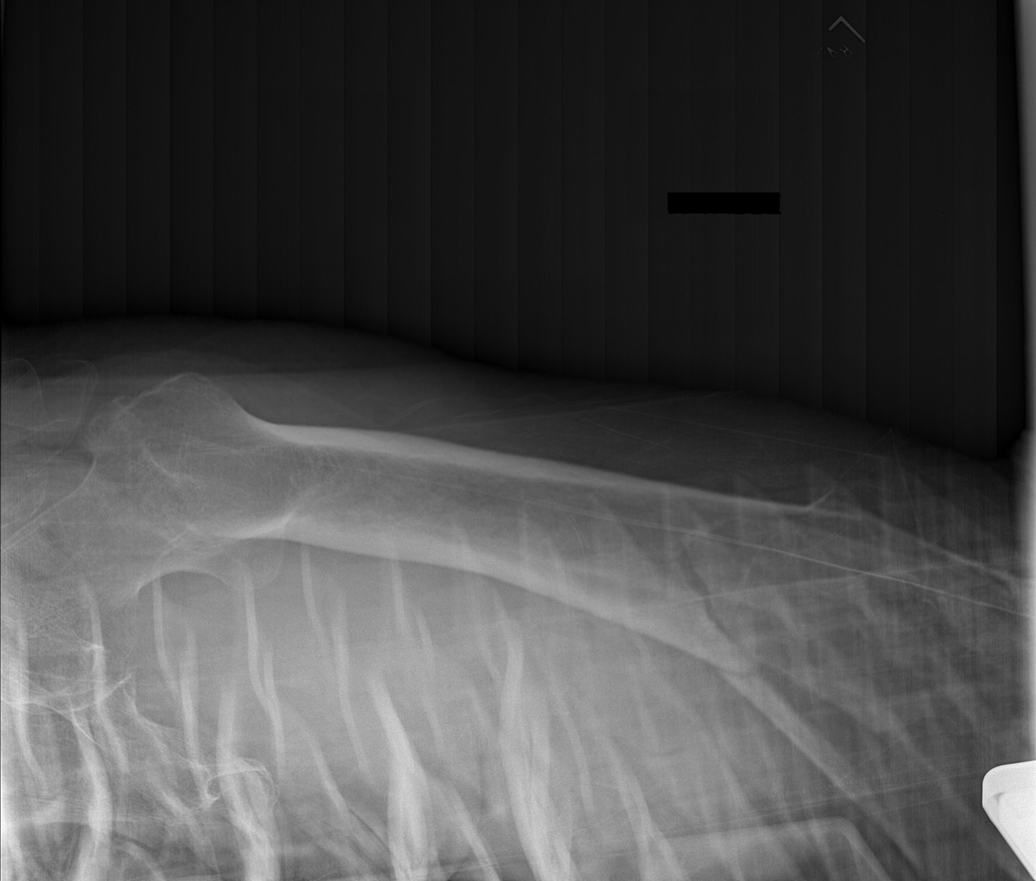

[w hip lat left (2 of 2)]
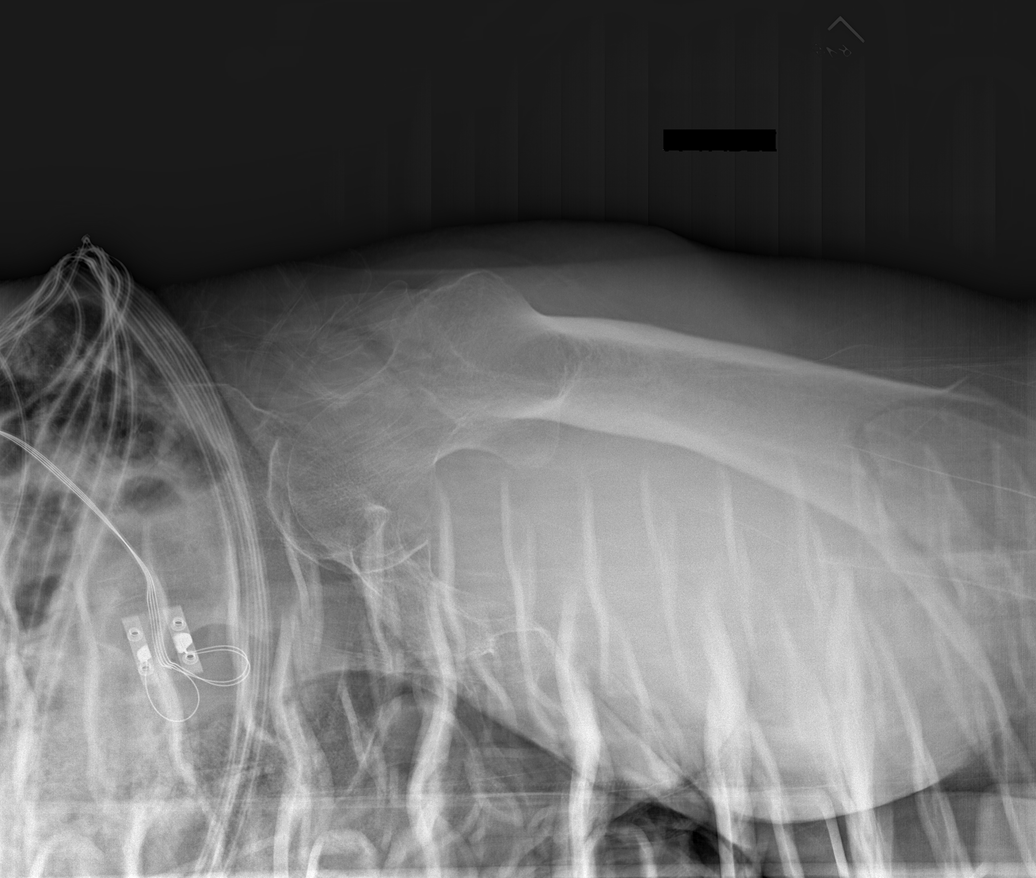

[x hip ap left]
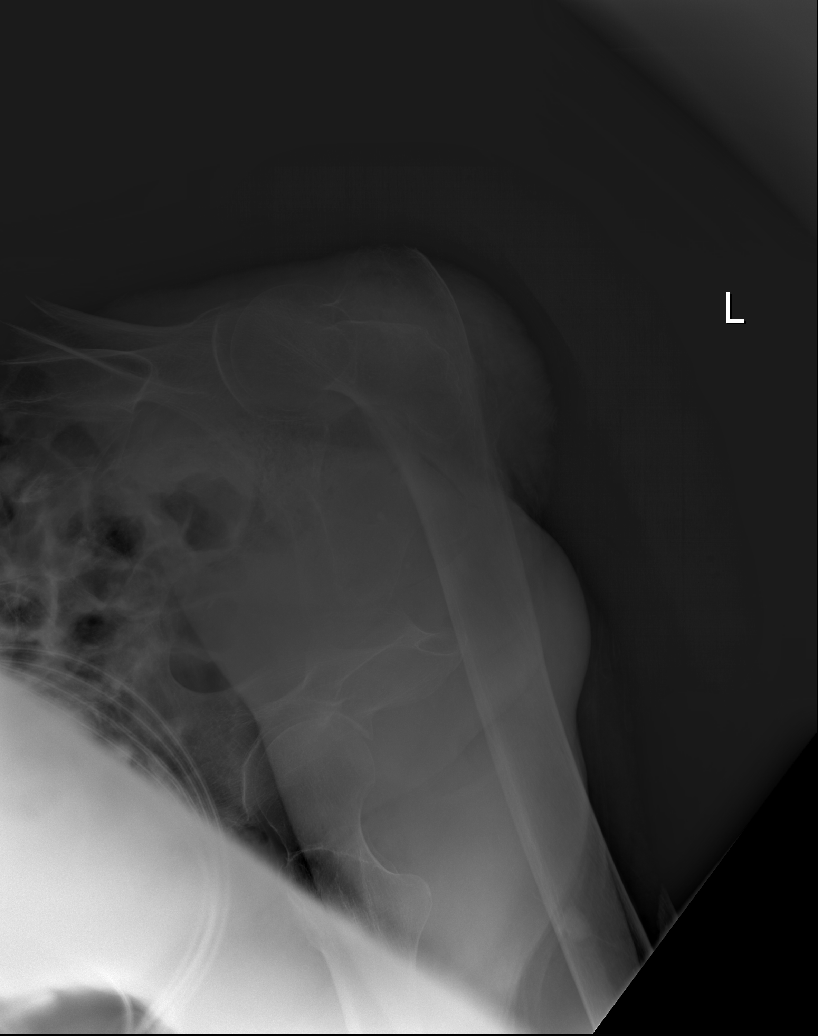

[x hip lat left]
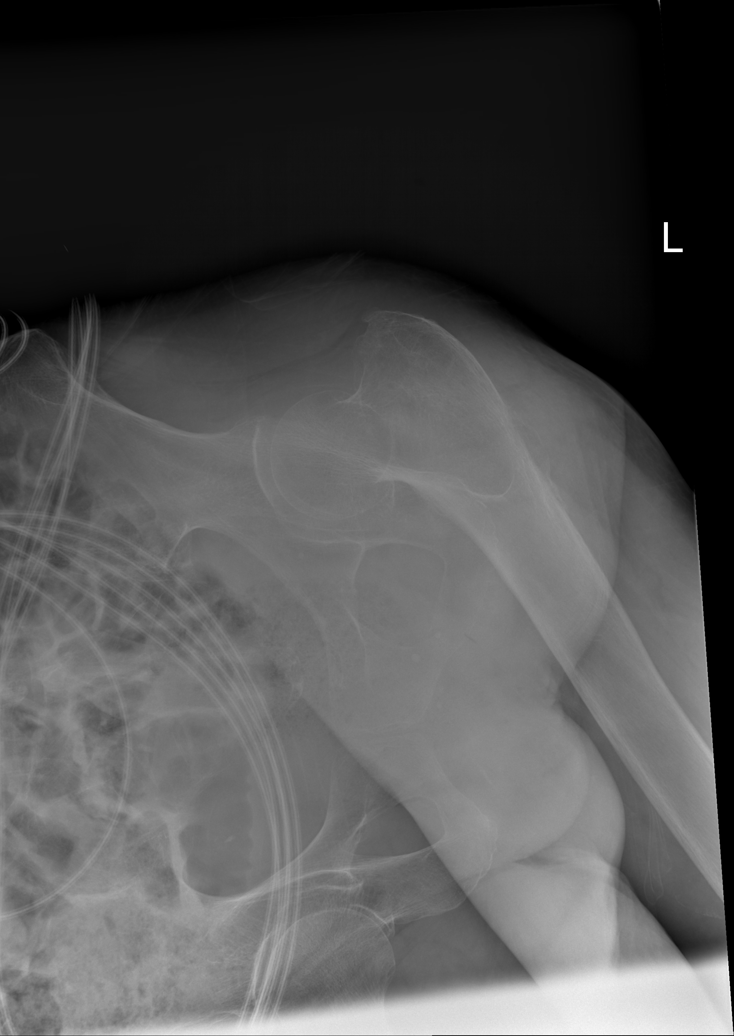

[x pelvis]
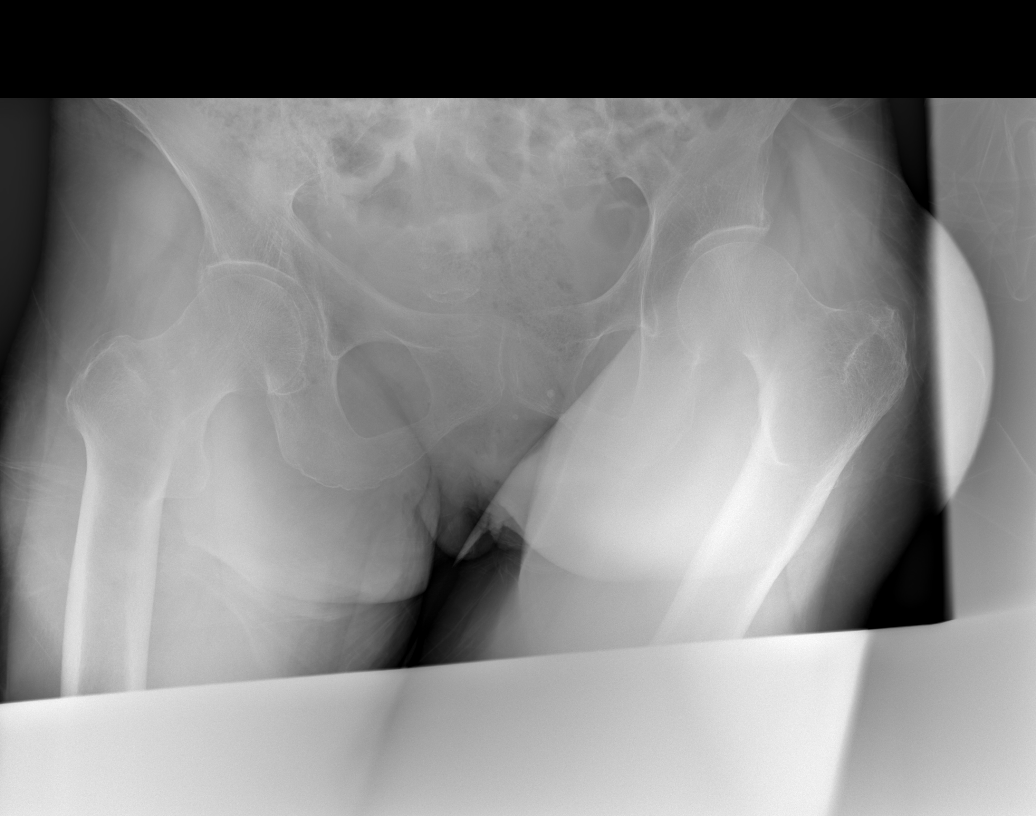

[5 of 5 positions shown; findings below may reference images not displayed]

FINDINGS: Partially visualized acute displaced fracture of the mid left
femoral shaft. Possible femoral neck fracture. There is no evidence
of severe arthropathy. Frontal views of the visualized portions of
the pelvis and right hip are grossly unremarkable.
IMPRESSION: 1. Markedly limited evaluation due to nonconventional views.
2. Partially visualized acute displaced fracture of the mid left
femoral shaft. Recommend dedicated x-ray left femur views.
3. A left femoral neck fracture cannot be excluded. Recommend repeat
dedicated x-ray left hip views.

## 2022-02-14 IMAGING — CR DG FEMUR 2+V*L*
5 series · 5 of 5 positions shown · non-contrast
Comparison: 09/24/2020

CLINICAL DATA: Fell earlier today

EXAM:
LEFT FEMUR 2 VIEWS

[x femur proximal ap left]
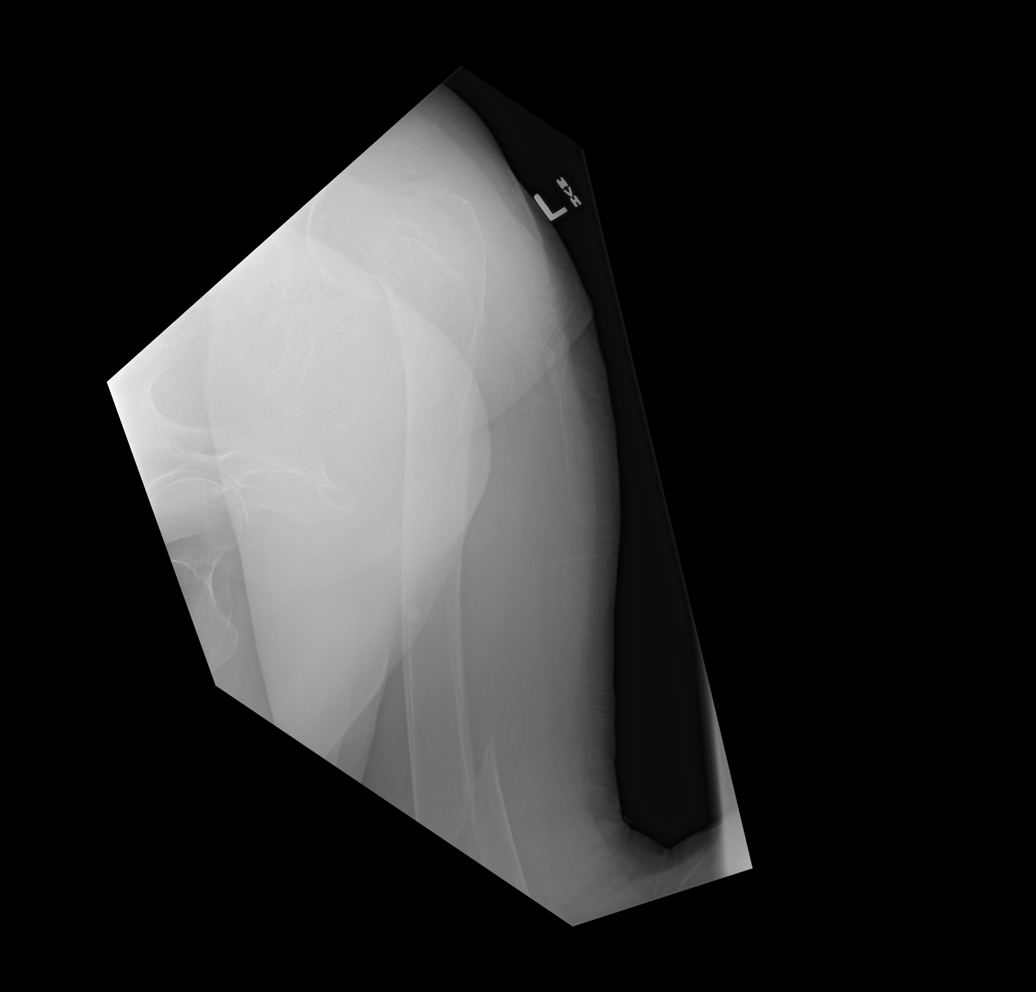

[x femur distal ap left]
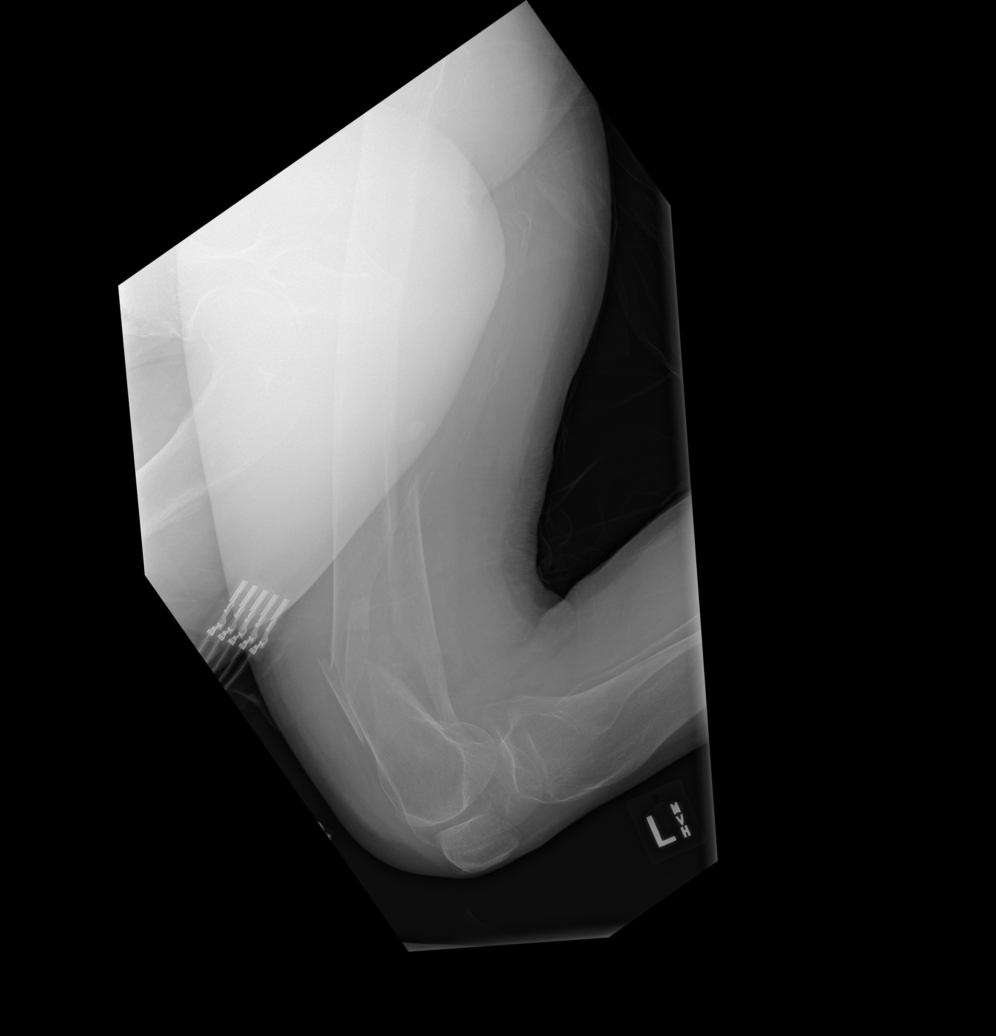

[x femur distal lat left (1 of 3)]
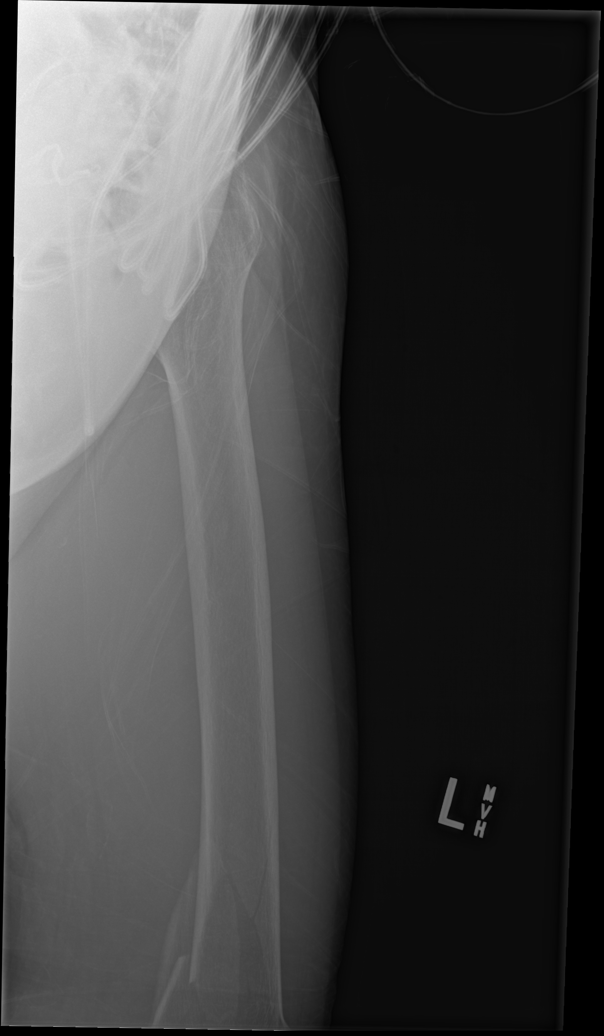

[x femur distal lat left (2 of 3)]
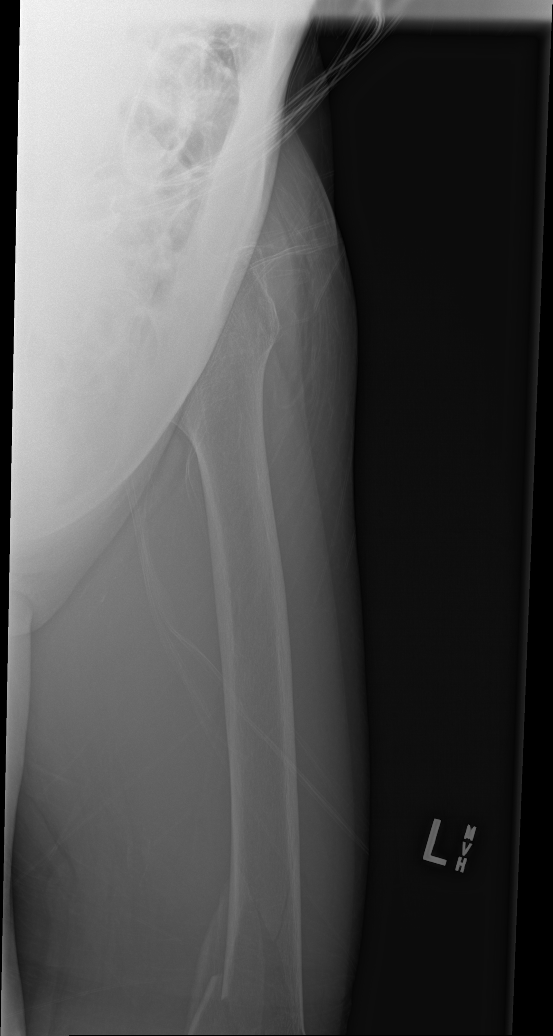

[x femur distal lat left (3 of 3)]
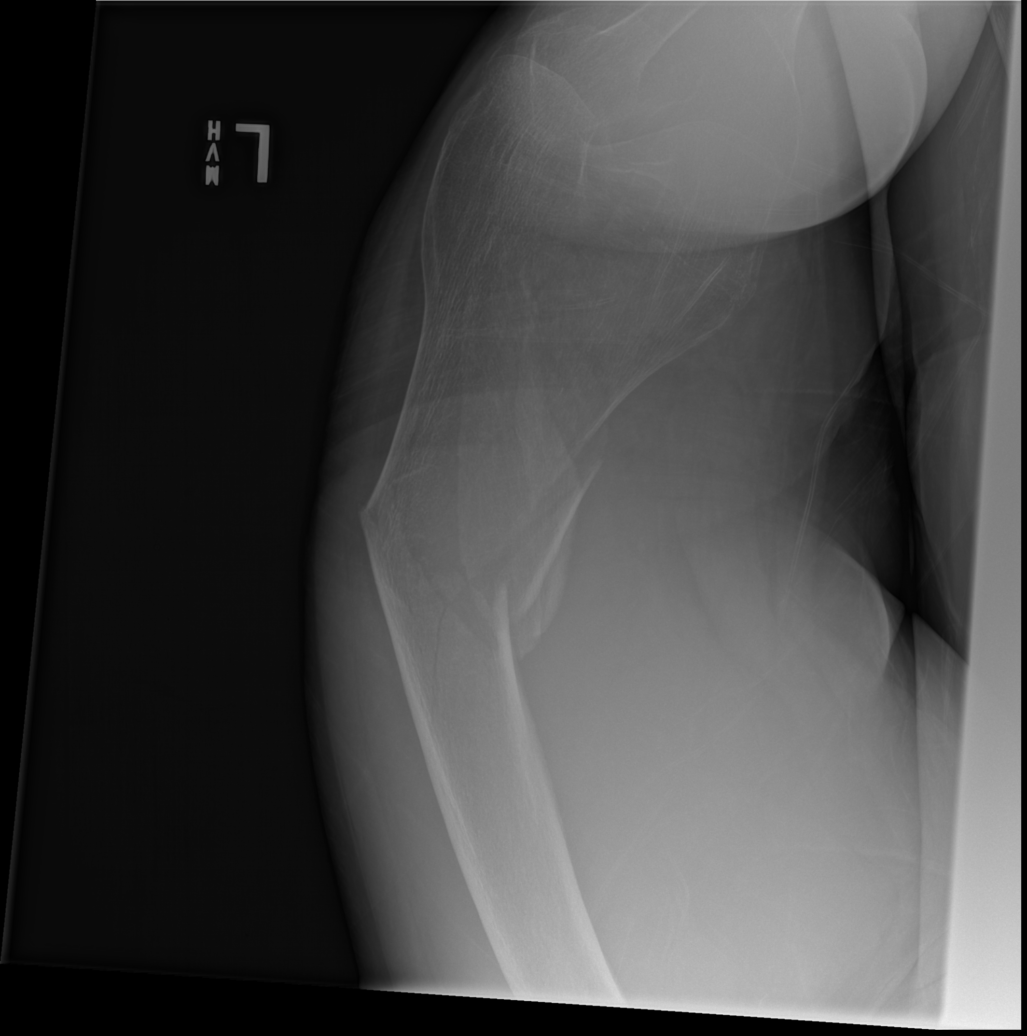

[5 of 5 positions shown; findings below may reference images not displayed]

FINDINGS: Proximal left femur/left femoral head and neck are poorly evaluated
due to positioning and limited visibility. There is an acute
comminuted fracture involving the distal shaft of the femur with
moderate posterior angulation of distal fracture fragments. There is
impaction at the fracture site with multiple displaced bone
fragments.
IMPRESSION: Acute comminuted, impacted and angulated fracture involving the
distal shaft of the femur. Left femoral head and neck are poorly
evaluated.

## 2022-02-17 DIAGNOSIS — F01B11 Vascular dementia, moderate, with agitation: Secondary | ICD-10-CM | POA: Diagnosis not present

## 2022-02-17 DIAGNOSIS — F0390 Unspecified dementia without behavioral disturbance: Secondary | ICD-10-CM | POA: Diagnosis not present

## 2022-02-17 DIAGNOSIS — F29 Unspecified psychosis not due to a substance or known physiological condition: Secondary | ICD-10-CM | POA: Diagnosis not present

## 2022-02-17 DIAGNOSIS — F419 Anxiety disorder, unspecified: Secondary | ICD-10-CM | POA: Diagnosis not present

## 2022-02-17 DIAGNOSIS — I69311 Memory deficit following cerebral infarction: Secondary | ICD-10-CM | POA: Diagnosis not present

## 2022-02-20 DIAGNOSIS — F0390 Unspecified dementia without behavioral disturbance: Secondary | ICD-10-CM | POA: Diagnosis not present

## 2022-02-20 DIAGNOSIS — R531 Weakness: Secondary | ICD-10-CM | POA: Diagnosis not present

## 2022-02-20 DIAGNOSIS — F29 Unspecified psychosis not due to a substance or known physiological condition: Secondary | ICD-10-CM | POA: Diagnosis not present

## 2022-02-20 DIAGNOSIS — R32 Unspecified urinary incontinence: Secondary | ICD-10-CM | POA: Diagnosis not present

## 2022-02-26 DIAGNOSIS — Z20822 Contact with and (suspected) exposure to covid-19: Secondary | ICD-10-CM | POA: Diagnosis not present

## 2022-03-07 DIAGNOSIS — Z20822 Contact with and (suspected) exposure to covid-19: Secondary | ICD-10-CM | POA: Diagnosis not present

## 2022-03-10 DIAGNOSIS — I69311 Memory deficit following cerebral infarction: Secondary | ICD-10-CM | POA: Diagnosis not present

## 2022-03-10 DIAGNOSIS — F01B11 Vascular dementia, moderate, with agitation: Secondary | ICD-10-CM | POA: Diagnosis not present

## 2022-03-10 DIAGNOSIS — F419 Anxiety disorder, unspecified: Secondary | ICD-10-CM | POA: Diagnosis not present

## 2022-03-20 DIAGNOSIS — M6281 Muscle weakness (generalized): Secondary | ICD-10-CM | POA: Diagnosis not present

## 2022-03-20 DIAGNOSIS — G459 Transient cerebral ischemic attack, unspecified: Secondary | ICD-10-CM | POA: Diagnosis not present

## 2022-03-20 DIAGNOSIS — F29 Unspecified psychosis not due to a substance or known physiological condition: Secondary | ICD-10-CM | POA: Diagnosis not present

## 2022-03-20 DIAGNOSIS — R5381 Other malaise: Secondary | ICD-10-CM | POA: Diagnosis not present

## 2022-03-20 DIAGNOSIS — D649 Anemia, unspecified: Secondary | ICD-10-CM | POA: Diagnosis not present

## 2022-03-20 DIAGNOSIS — S7290XD Unspecified fracture of unspecified femur, subsequent encounter for closed fracture with routine healing: Secondary | ICD-10-CM | POA: Diagnosis not present

## 2022-03-20 DIAGNOSIS — F0390 Unspecified dementia without behavioral disturbance: Secondary | ICD-10-CM | POA: Diagnosis not present

## 2022-03-23 DIAGNOSIS — D649 Anemia, unspecified: Secondary | ICD-10-CM | POA: Diagnosis not present

## 2022-03-23 DIAGNOSIS — G459 Transient cerebral ischemic attack, unspecified: Secondary | ICD-10-CM | POA: Diagnosis not present

## 2022-03-23 DIAGNOSIS — F0390 Unspecified dementia without behavioral disturbance: Secondary | ICD-10-CM | POA: Diagnosis not present

## 2022-03-23 DIAGNOSIS — M6281 Muscle weakness (generalized): Secondary | ICD-10-CM | POA: Diagnosis not present

## 2022-03-23 DIAGNOSIS — F29 Unspecified psychosis not due to a substance or known physiological condition: Secondary | ICD-10-CM | POA: Diagnosis not present

## 2022-03-23 DIAGNOSIS — R5381 Other malaise: Secondary | ICD-10-CM | POA: Diagnosis not present

## 2022-04-07 DIAGNOSIS — I69311 Memory deficit following cerebral infarction: Secondary | ICD-10-CM | POA: Diagnosis not present

## 2022-04-07 DIAGNOSIS — F01B11 Vascular dementia, moderate, with agitation: Secondary | ICD-10-CM | POA: Diagnosis not present

## 2022-04-07 DIAGNOSIS — F419 Anxiety disorder, unspecified: Secondary | ICD-10-CM | POA: Diagnosis not present

## 2022-04-23 DIAGNOSIS — D649 Anemia, unspecified: Secondary | ICD-10-CM | POA: Diagnosis not present

## 2022-04-23 DIAGNOSIS — F0390 Unspecified dementia without behavioral disturbance: Secondary | ICD-10-CM | POA: Diagnosis not present

## 2022-04-23 DIAGNOSIS — I679 Cerebrovascular disease, unspecified: Secondary | ICD-10-CM | POA: Diagnosis not present

## 2022-04-23 DIAGNOSIS — F29 Unspecified psychosis not due to a substance or known physiological condition: Secondary | ICD-10-CM | POA: Diagnosis not present

## 2022-04-29 ENCOUNTER — Non-Acute Institutional Stay: Payer: Medicare Other | Admitting: Hospice

## 2022-04-29 DIAGNOSIS — Z515 Encounter for palliative care: Secondary | ICD-10-CM | POA: Diagnosis not present

## 2022-04-29 DIAGNOSIS — F03918 Unspecified dementia, unspecified severity, with other behavioral disturbance: Secondary | ICD-10-CM | POA: Diagnosis not present

## 2022-04-29 DIAGNOSIS — F03911 Unspecified dementia, unspecified severity, with agitation: Secondary | ICD-10-CM

## 2022-05-05 DIAGNOSIS — F01B11 Vascular dementia, moderate, with agitation: Secondary | ICD-10-CM | POA: Diagnosis not present

## 2022-05-05 DIAGNOSIS — F419 Anxiety disorder, unspecified: Secondary | ICD-10-CM | POA: Diagnosis not present

## 2022-05-05 DIAGNOSIS — I69311 Memory deficit following cerebral infarction: Secondary | ICD-10-CM | POA: Diagnosis not present

## 2022-06-02 DIAGNOSIS — F419 Anxiety disorder, unspecified: Secondary | ICD-10-CM | POA: Diagnosis not present

## 2022-06-02 DIAGNOSIS — F01B11 Vascular dementia, moderate, with agitation: Secondary | ICD-10-CM | POA: Diagnosis not present

## 2022-06-02 DIAGNOSIS — I69311 Memory deficit following cerebral infarction: Secondary | ICD-10-CM | POA: Diagnosis not present

## 2022-07-01 DIAGNOSIS — F419 Anxiety disorder, unspecified: Secondary | ICD-10-CM | POA: Diagnosis not present

## 2022-07-01 DIAGNOSIS — F01B11 Vascular dementia, moderate, with agitation: Secondary | ICD-10-CM | POA: Diagnosis not present

## 2022-07-01 DIAGNOSIS — I69311 Memory deficit following cerebral infarction: Secondary | ICD-10-CM | POA: Diagnosis not present

## 2022-07-02 DIAGNOSIS — D649 Anemia, unspecified: Secondary | ICD-10-CM | POA: Diagnosis not present

## 2022-07-02 DIAGNOSIS — I679 Cerebrovascular disease, unspecified: Secondary | ICD-10-CM | POA: Diagnosis not present

## 2022-07-02 DIAGNOSIS — F0390 Unspecified dementia without behavioral disturbance: Secondary | ICD-10-CM | POA: Diagnosis not present

## 2022-07-02 DIAGNOSIS — F29 Unspecified psychosis not due to a substance or known physiological condition: Secondary | ICD-10-CM | POA: Diagnosis not present

## 2022-08-19 DIAGNOSIS — F01B11 Vascular dementia, moderate, with agitation: Secondary | ICD-10-CM | POA: Diagnosis not present

## 2022-08-19 DIAGNOSIS — F419 Anxiety disorder, unspecified: Secondary | ICD-10-CM | POA: Diagnosis not present

## 2022-08-19 DIAGNOSIS — I69311 Memory deficit following cerebral infarction: Secondary | ICD-10-CM | POA: Diagnosis not present

## 2022-09-23 DIAGNOSIS — F419 Anxiety disorder, unspecified: Secondary | ICD-10-CM | POA: Diagnosis not present

## 2022-09-23 DIAGNOSIS — F01B11 Vascular dementia, moderate, with agitation: Secondary | ICD-10-CM | POA: Diagnosis not present

## 2022-09-23 DIAGNOSIS — I69311 Memory deficit following cerebral infarction: Secondary | ICD-10-CM | POA: Diagnosis not present

## 2022-09-26 ENCOUNTER — Non-Acute Institutional Stay: Payer: Medicare Other | Admitting: Hospice

## 2022-09-26 DIAGNOSIS — K5901 Slow transit constipation: Secondary | ICD-10-CM | POA: Diagnosis not present

## 2022-09-26 DIAGNOSIS — F03911 Unspecified dementia, unspecified severity, with agitation: Secondary | ICD-10-CM | POA: Diagnosis not present

## 2022-09-26 DIAGNOSIS — Z515 Encounter for palliative care: Secondary | ICD-10-CM

## 2022-09-26 DIAGNOSIS — F03918 Unspecified dementia, unspecified severity, with other behavioral disturbance: Secondary | ICD-10-CM

## 2022-09-26 NOTE — Progress Notes (Signed)
[facility] Consult Note Telephone: [phone]  Fax: [phone]  PATIENT NAME: Deanna Strong DOB: [birth year] MRN: [id number]  PRIMARY CARE PROVIDER:  Dr. Charlynne Pander  REFERRING PROVIDER: Dr. Charlynne Pander  RESPONSIBLE PARTY:   Contact Information     Name Relation Home Work Mobile   Teia, Freitas Daughter [phone]  [phone]   Sonnie, Bias Daughter [phone]         Visit is to build trust and highlight Palliative Medicine as specialized medical care for people living with serious illness, aimed at facilitating better quality of life through symptoms relief, assisting with advance care planning and complex medical decision making. This is a follow up visit.  RECOMMENDATIONS/PLAN:   Advance Care Planning/Code Status:Patient is a Do Not Resuscitate  Goals of Care: Goals of care include to maximize quality of life and symptom management.  Comfort care only  Symptom management/Plan:  Dementia: Progressive memory loss/confusion/agitation in line with disease trajectory.  Fast 6D, FLACC 0, continue ongoing supportive care. Patient is comfort care only. Agitation: Related to Dementia. Continue Haldol, Ativan  as ordered. Patient often refuses care/medications.  Continue to offer care/medications. Patient is uncooperative at baseline. Use calm approach, distraction, persuasion.  Psych is following patient.  Constipation: Use milk of magnesia as ordered.  Encourage adequate oral intake, and fiber. Follow up: Palliative care will continue to follow for complex medical decision making, advance care planning, and clarification of goals. Return 6 weeks or prn. Encouraged to call provider sooner with any concerns.  CHIEF COMPLAINT: Palliative follow up  HISTORY OF PRESENT ILLNESS:  Deanna Strong a 88 y.o. female with multiple medical problems including dementia with behavioral disturbance, psychosis, agitation, followed by Psych. History  of hypertension, femoral fracture -left.  History obtained from review of EMR, discussion with nursing staff, family and/or patient. Records reviewed and summarized above. All 10 point systems reviewed and are negative except as documented in history of present illness above Review and summarization of Epic records shows history from other than patient.   Palliative Care was asked to follow this patient o help address complex decision making in the context of advance care planning and goals of care clarification.    PERTINENT MEDICATIONS:  Outpatient Encounter Medications as of 09/26/2022  Medication Sig   acetaminophen (TYLENOL) 325 MG tablet Take 1-2 tablets (325-650 mg total) by mouth every 6 (six) hours as needed for mild pain (pain score 1-3 or temp > 100.5).   alum & mag hydroxide-simeth (MINTOX) 200-200-20 MG/5ML suspension Take 30 mLs by mouth every 6 (six) hours as needed for indigestion or heartburn.   ascorbic acid (VITAMIN C) 500 MG tablet Take 1 tablet (500 mg total) by mouth daily.   Cholecalciferol (VITAMIN D) 125 MCG (5000 UT) CAPS Take 1 capsule by mouth daily.   ciclopirox (LOPROX) 0.77 % cream Apply 1 application topically 2 (two) times daily. Apply to feet for fungal infection   guaiFENesin (ROBITUSSIN) 100 MG/5ML SOLN Take 10 mLs by mouth every 6 (six) hours as needed for cough or to loosen phlegm.    haloperidol lactate (HALDOL) 5 MG/ML injection Inject 0.4 mLs (2 mg total) into the vein every 6 (six) hours as needed.   loperamide (IMODIUM) 2 MG capsule Take 2 mg by mouth daily as needed for diarrhea or loose stools.    magnesium hydroxide (MILK OF MAGNESIA) 400 MG/5ML suspension Take 30 mLs by mouth daily as needed for mild constipation.   Multiple Vitamin (MULTIVITAMIN  WITH MINERALS) TABS tablet Take 1 tablet by mouth daily.   neomycin-bacitracin-polymyxin (NEOSPORIN) OINT Apply 1 application topically daily as needed for wound care.    OVER THE COUNTER MEDICATION Take 1  tablet by mouth 2 (two) times daily. Digestive enzyme all natural   Vitamin D, Ergocalciferol, (DRISDOL) 1.25 MG (50000 UNIT) CAPS capsule Take 1 capsule (50,000 Units total) by mouth every 7 (seven) days.   No facility-administered encounter medications on file as of 09/26/2022.    HOSPICE ELIGIBILITY/DIAGNOSIS: TBD  PAST MEDICAL HISTORY:  Past Medical History:  Diagnosis Date   Anemia    Dementia ([facility])    Depression    Hyperlipidemia    Hyperthyroidism    IBS (irritable bowel syndrome)    Psychosis ([facility]) 06/01/2018   Strabismus      ALLERGIES: No Known Allergies    I spent  45 minutes providing this consultation; this includes time spent with patient/family, chart review and documentation. More than 50% of the time in this consultation was spent on counseling and coordinating communication   Thank you for the opportunity to participate in the care of Vaidehi Braddy Please call our office at [phone] if we can be of additional assistance.  Note: Portions of this note were generated with Scientist, clinical (histocompatibility and immunogenetics). Dictation errors may occur despite best attempts at proofreading.  Rosaura Carpenter, NP

## 2022-10-06 DIAGNOSIS — F419 Anxiety disorder, unspecified: Secondary | ICD-10-CM | POA: Diagnosis not present

## 2022-10-06 DIAGNOSIS — F0390 Unspecified dementia without behavioral disturbance: Secondary | ICD-10-CM | POA: Diagnosis not present

## 2022-10-06 DIAGNOSIS — F29 Unspecified psychosis not due to a substance or known physiological condition: Secondary | ICD-10-CM | POA: Diagnosis not present

## 2022-10-09 DIAGNOSIS — S72402A Unspecified fracture of lower end of left femur, initial encounter for closed fracture: Secondary | ICD-10-CM | POA: Diagnosis not present

## 2022-10-09 DIAGNOSIS — F0390 Unspecified dementia without behavioral disturbance: Secondary | ICD-10-CM | POA: Diagnosis not present

## 2022-10-09 DIAGNOSIS — F29 Unspecified psychosis not due to a substance or known physiological condition: Secondary | ICD-10-CM | POA: Diagnosis not present

## 2022-10-30 DIAGNOSIS — F419 Anxiety disorder, unspecified: Secondary | ICD-10-CM | POA: Diagnosis not present

## 2022-10-30 DIAGNOSIS — F29 Unspecified psychosis not due to a substance or known physiological condition: Secondary | ICD-10-CM | POA: Diagnosis not present

## 2022-10-31 ENCOUNTER — Non-Acute Institutional Stay: Payer: Medicare Other | Admitting: Hospice

## 2022-10-31 DIAGNOSIS — F03911 Unspecified dementia, unspecified severity, with agitation: Secondary | ICD-10-CM

## 2022-10-31 DIAGNOSIS — Z515 Encounter for palliative care: Secondary | ICD-10-CM

## 2022-10-31 DIAGNOSIS — F03918 Unspecified dementia, unspecified severity, with other behavioral disturbance: Secondary | ICD-10-CM

## 2022-10-31 DIAGNOSIS — K5901 Slow transit constipation: Secondary | ICD-10-CM | POA: Diagnosis not present

## 2022-10-31 NOTE — Progress Notes (Signed)
Therapist, nutritional Palliative Care Consult Note Telephone: [phone]  Fax: [phone]  PATIENT NAME: Deanna Strong DOB: 1934-04-18 MRN: [id number]  PRIMARY CARE PROVIDER:  Dr. Charlynne Pander  REFERRING PROVIDER: Dr. Charlynne Pander  RESPONSIBLE PARTY:   Contact Information     Name Relation Home Work Mobile   Aprille, Sawhney Daughter [phone]  [phone]   [place], [place] Daughter [phone]         Visit is to build trust and highlight Palliative Medicine as specialized medical care for people living with serious illness, aimed at facilitating better quality of life through symptoms relief, assisting with advance care planning and complex medical decision making. This is a follow up visit.  RECOMMENDATIONS/PLAN:   Advance Care Planning/Code Status:Patient is a Do Not Resuscitate  Goals of Care: Goals of care include to maximize quality of life and symptom management.  Comfort care only  Symptom management/Plan:  Dementia: Advanced with behavioral disturbance. FAST 6D. Continue ongoing supportive care. Patient is comfort care only; often refuses care. Agitation: Related to Dementia. Continue Haldol, Ativan  as ordered.   Continue to offer care/medications. Patient is uncooperative at baseline. Use calm approach, distraction, persuasion.  Psych is following patient.  Constipation: Use milk of magnesia as ordered.  Encourage adequate oral intake, and fiber. Follow up: Palliative care will continue to follow for complex medical decision making, advance care planning, and clarification of goals. Return 6 weeks or prn. Encouraged to call provider sooner with any concerns.  CHIEF COMPLAINT: Palliative follow up  HISTORY OF PRESENT ILLNESS:  Deanna Strong a 88 y.o. female with multiple medical problems including dementia with behavioral disturbance, psychosis, agitation, followed by Psych. History of hypertension, femoral fracture -left.  History obtained from  review of EMR, discussion with nursing staff, family and/or patient. Records reviewed and summarized above. All 10 point systems reviewed and are negative except as documented in history of present illness above Review and summarization of Epic records shows history from other than patient.   Palliative Care was asked to follow this patient o help address complex decision making in the context of advance care planning and goals of care clarification.    PERTINENT MEDICATIONS:  Outpatient Encounter Medications as of 10/31/2022  Medication Sig   acetaminophen (TYLENOL) 325 MG tablet Take 1-2 tablets (325-650 mg total) by mouth every 6 (six) hours as needed for mild pain (pain score 1-3 or temp > 100.5).   alum & mag hydroxide-simeth (MINTOX) 200-200-20 MG/5ML suspension Take 30 mLs by mouth every 6 (six) hours as needed for indigestion or heartburn.   ascorbic acid (VITAMIN C) 500 MG tablet Take 1 tablet (500 mg total) by mouth daily.   Cholecalciferol (VITAMIN D) 125 MCG (5000 UT) CAPS Take 1 capsule by mouth daily.   ciclopirox (LOPROX) 0.77 % cream Apply 1 application topically 2 (two) times daily. Apply to feet for fungal infection   guaiFENesin (ROBITUSSIN) 100 MG/5ML SOLN Take 10 mLs by mouth every 6 (six) hours as needed for cough or to loosen phlegm.    haloperidol lactate (HALDOL) 5 MG/ML injection Inject 0.4 mLs (2 mg total) into the vein every 6 (six) hours as needed.   loperamide (IMODIUM) 2 MG capsule Take 2 mg by mouth daily as needed for diarrhea or loose stools.    magnesium hydroxide (MILK OF MAGNESIA) 400 MG/5ML suspension Take 30 mLs by mouth daily as needed for mild constipation.   Multiple Vitamin (MULTIVITAMIN WITH MINERALS) TABS tablet Take 1 tablet  by mouth daily.   neomycin-bacitracin-polymyxin (NEOSPORIN) OINT Apply 1 application topically daily as needed for wound care.    OVER THE COUNTER MEDICATION Take 1 tablet by mouth 2 (two) times daily. Digestive enzyme all natural    Vitamin D, Ergocalciferol, (DRISDOL) 1.25 MG (50000 UNIT) CAPS capsule Take 1 capsule (50,000 Units total) by mouth every 7 (seven) days.   No facility-administered encounter medications on file as of 10/31/2022.    HOSPICE ELIGIBILITY/DIAGNOSIS: TBD  PAST MEDICAL HISTORY:  Past Medical History:  Diagnosis Date   Anemia    Dementia ([place])    Depression    Hyperlipidemia    Hyperthyroidism    IBS (irritable bowel syndrome)    Psychosis ([place]) 06/01/2018   Strabismus      ALLERGIES: No Known Allergies    I spent  35 minutes providing this consultation; this includes time spent with patient/family, chart review and documentation. More than 50% of the time in this consultation was spent on counseling and coordinating communication   Thank you for the opportunity to participate in the care of Deanna Strong Please call our office at [phone] if we can be of additional assistance.  Note: Portions of this note were generated with Lobbyist. Dictation errors may occur despite best attempts at proofreading.  Teodoro Spray, NP

## 2022-11-28 ENCOUNTER — Non-Acute Institutional Stay: Payer: Medicare Other | Admitting: Hospice

## 2022-11-28 DIAGNOSIS — F03911 Unspecified dementia, unspecified severity, with agitation: Secondary | ICD-10-CM

## 2022-11-28 DIAGNOSIS — F03918 Unspecified dementia, unspecified severity, with other behavioral disturbance: Secondary | ICD-10-CM

## 2022-11-28 DIAGNOSIS — K5901 Slow transit constipation: Secondary | ICD-10-CM

## 2022-11-28 DIAGNOSIS — Z515 Encounter for palliative care: Secondary | ICD-10-CM

## 2022-11-28 NOTE — Progress Notes (Signed)
[facility] Consult Note Telephone: [phone]  Fax: [phone]  PATIENT NAME: Deanna Strong DOB: [birth year] MRN: [id number]  PRIMARY CARE PROVIDER:  Dr. Caprice Renshaw  REFERRING PROVIDER: Dr. Caprice Renshaw  RESPONSIBLE PARTY:   Contact Information     Name Relation Home Work Mobile   [place], [place] Daughter [phone]  [place], [place] Daughter [phone]         Visit is to build trust and highlight Palliative Medicine as specialized medical care for people living with serious illness, aimed at facilitating better quality of life through symptoms relief, assisting with advance care planning and complex medical decision making. This is a follow up visit.  RECOMMENDATIONS/PLAN:   Advance Care Planning/Code Status:Patient is a Do Not Resuscitate  Goals of Care: Goals of care include to maximize quality of life and symptom management.  Comfort care only  Symptom management/Plan:  Dementia: Advanced with behavioral disturbance. FAST 6D. Patient is comfort care only; often refuses care. Agitation: Related to Dementia. Continue Haldol, Ativan  as ordered.   Continue to offer care/medications. Patient is uncooperative at baseline. Use calm approach, distraction, persuasion.  Psych is following patient.  Constipation: Managed with milk of magnesia .  Follow up: Palliative care will continue to follow for complex medical decision making, advance care planning, and clarification of goals. Return 6 weeks or prn. Encouraged to call provider sooner with any concerns.  CHIEF COMPLAINT: Palliative follow up  HISTORY OF PRESENT ILLNESS:  Deanna Strong a 89 y.o. female with multiple medical problems including dementia with behavioral disturbance, psychosis, agitation, followed by Psych. History of hypertension, femoral fracture -left.  History obtained from review of EMR, discussion with nursing staff, family and/or patient. Records  reviewed and summarized above. All 10 point systems reviewed and are negative except as documented in history of present illness above Review and summarization of Epic records shows history from other than patient.   Palliative Care was asked to follow this patient o help address complex decision making in the context of advance care planning and goals of care clarification.    PERTINENT MEDICATIONS:  Outpatient Encounter Medications as of 11/28/2022  Medication Sig   acetaminophen (TYLENOL) 325 MG tablet Take 1-2 tablets (325-650 mg total) by mouth every 6 (six) hours as needed for mild pain (pain score 1-3 or temp > 100.5).   alum & mag hydroxide-simeth (MINTOX) 016-010-93 MG/5ML suspension Take 30 mLs by mouth every 6 (six) hours as needed for indigestion or heartburn.   ascorbic acid (VITAMIN C) 500 MG tablet Take 1 tablet (500 mg total) by mouth daily.   Cholecalciferol (VITAMIN D) 125 MCG (5000 UT) CAPS Take 1 capsule by mouth daily.   ciclopirox (LOPROX) 0.77 % cream Apply 1 application topically 2 (two) times daily. Apply to feet for fungal infection   guaiFENesin (ROBITUSSIN) 100 MG/5ML SOLN Take 10 mLs by mouth every 6 (six) hours as needed for cough or to loosen phlegm.    haloperidol lactate (HALDOL) 5 MG/ML injection Inject 0.4 mLs (2 mg total) into the vein every 6 (six) hours as needed.   loperamide (IMODIUM) 2 MG capsule Take 2 mg by mouth daily as needed for diarrhea or loose stools.    magnesium hydroxide (MILK OF MAGNESIA) 400 MG/5ML suspension Take 30 mLs by mouth daily as needed for mild constipation.   Multiple Vitamin (MULTIVITAMIN WITH MINERALS) TABS tablet Take 1 tablet by mouth daily.   neomycin-bacitracin-polymyxin (NEOSPORIN) OINT Apply 1  application topically daily as needed for wound care.    OVER THE COUNTER MEDICATION Take 1 tablet by mouth 2 (two) times daily. Digestive enzyme all natural   Vitamin D, Ergocalciferol, (DRISDOL) 1.25 MG (50000 UNIT) CAPS capsule  Take 1 capsule (50,000 Units total) by mouth every 7 (seven) days.   No facility-administered encounter medications on file as of 11/28/2022.    HOSPICE ELIGIBILITY/DIAGNOSIS: TBD  PAST MEDICAL HISTORY:  Past Medical History:  Diagnosis Date   Anemia    Dementia ([place])    Depression    Hyperlipidemia    Hyperthyroidism    IBS (irritable bowel syndrome)    Psychosis ([place]) 06/01/2018   Strabismus      ALLERGIES: No Known Allergies    I spent  35 minutes providing this consultation; this includes time spent with patient/family, chart review and documentation. More than 50% of the time in this consultation was spent on counseling and coordinating communication   Thank you for the opportunity to participate in the care of Deanna Strong Please call our office at [phone] if we can be of additional assistance.  Note: Portions of this note were generated with Lobbyist. Dictation errors may occur despite best attempts at proofreading.  Teodoro Spray, NP

## 2023-01-23 ENCOUNTER — Non-Acute Institutional Stay: Payer: Medicare Other | Admitting: Hospice

## 2023-01-23 DIAGNOSIS — K5901 Slow transit constipation: Secondary | ICD-10-CM

## 2023-01-23 DIAGNOSIS — F03911 Unspecified dementia, unspecified severity, with agitation: Secondary | ICD-10-CM

## 2023-01-23 DIAGNOSIS — Z515 Encounter for palliative care: Secondary | ICD-10-CM

## 2023-01-23 DIAGNOSIS — F03918 Unspecified dementia, unspecified severity, with other behavioral disturbance: Secondary | ICD-10-CM

## 2023-01-23 NOTE — Progress Notes (Signed)
[facility] Consult Note Telephone: [phone]  Fax: [phone]  PATIENT NAME: Deanna Strong DOB: 04/14/1934 MRN: [id number]  PRIMARY CARE PROVIDER:  Dr. Caprice Renshaw  REFERRING PROVIDER: Dr. Caprice Renshaw  RESPONSIBLE PARTY:   Contact Information     Name Relation Home Work Mobile   [place], [place] Daughter [phone]  [place], [place] Daughter [phone]         Visit is to build trust and highlight Palliative Medicine as specialized medical care for people living with serious illness, aimed at facilitating better quality of life through symptoms relief, assisting with advance care planning and complex medical decision making. This is a follow up visit. NP called [facility]'s numbers-not working.  NP called Sonja and left message with her spouse with callback number.    RECOMMENDATIONS/PLAN:   Advance Care Planning/Code Status:Patient is a Do Not Resuscitate  Goals of Care: Goals of care include to maximize quality of life and symptom management.  Comfort care only Weight 107.4 Feb '24 106.04 Oct 2022 Symptom management/Plan:  Dementia, Advanced with behavioral disturbance: Continue ongoing supportive care. FAST 6D. Patient is comfort care only; often refuses care.  Agitation: Related to Dementia. Refuses to take meds per her faith. Haldol, Ativan were dcd. Use calm approach, distraction, persuasion.  Psych is following patient.   Constipation: Managed with milk of magnesia .   Follow up: Palliative care will continue to follow for complex medical decision making, advance care planning, and clarification of goals. Return 6 weeks or prn. Encouraged to call provider sooner with any concerns.  CHIEF COMPLAINT: Palliative follow up  HISTORY OF PRESENT ILLNESS:  Deanna Strong a 89 y.o. female with multiple medical problems including dementia with behavioral disturbance, psychosis, agitation, followed by Psych. History of  hypertension, femoral fracture -left. Patient is calm, answered few questions directed at her, did not want to be bothered and refused physical exam.  History obtained from review of EMR, discussion with nursing staff, family and/or patient. Records reviewed and summarized above. All 10 point systems reviewed and are negative except as documented in history of present illness above Review and summarization of Epic records shows history from other than patient.   Palliative Care was asked to follow this patient o help address complex decision making in the context of advance care planning and goals of care clarification.    PERTINENT MEDICATIONS:  Outpatient Encounter Medications as of 01/23/2023  Medication Sig   acetaminophen (TYLENOL) 325 MG tablet Take 1-2 tablets (325-650 mg total) by mouth every 6 (six) hours as needed for mild pain (pain score 1-3 or temp > 100.5).   alum & mag hydroxide-simeth (MINTOX) [id number] MG/5ML suspension Take 30 mLs by mouth every 6 (six) hours as needed for indigestion or heartburn.   ascorbic acid (VITAMIN C) 500 MG tablet Take 1 tablet (500 mg total) by mouth daily.   Cholecalciferol (VITAMIN D) 125 MCG (5000 UT) CAPS Take 1 capsule by mouth daily.   ciclopirox (LOPROX) 0.77 % cream Apply 1 application topically 2 (two) times daily. Apply to feet for fungal infection   guaiFENesin (ROBITUSSIN) 100 MG/5ML SOLN Take 10 mLs by mouth every 6 (six) hours as needed for cough or to loosen phlegm.    haloperidol lactate (HALDOL) 5 MG/ML injection Inject 0.4 mLs (2 mg total) into the vein every 6 (six) hours as needed.   loperamide (IMODIUM) 2 MG capsule Take 2 mg by mouth daily as needed for diarrhea or  loose stools.    magnesium hydroxide (MILK OF MAGNESIA) 400 MG/5ML suspension Take 30 mLs by mouth daily as needed for mild constipation.   Multiple Vitamin (MULTIVITAMIN WITH MINERALS) TABS tablet Take 1 tablet by mouth daily.   neomycin-bacitracin-polymyxin (NEOSPORIN)  OINT Apply 1 application topically daily as needed for wound care.    OVER THE COUNTER MEDICATION Take 1 tablet by mouth 2 (two) times daily. Digestive enzyme all natural   Vitamin D, Ergocalciferol, (DRISDOL) 1.25 MG (50000 UNIT) CAPS capsule Take 1 capsule (50,000 Units total) by mouth every 7 (seven) days.   No facility-administered encounter medications on file as of 01/23/2023.    HOSPICE ELIGIBILITY/DIAGNOSIS: TBD  PAST MEDICAL HISTORY:  Past Medical History:  Diagnosis Date   Anemia    Dementia ([place])    Depression    Hyperlipidemia    Hyperthyroidism    IBS (irritable bowel syndrome)    Psychosis ([place] [place]) 06/01/2018   Strabismus      ALLERGIES: No Known Allergies    I spent  35 minutes providing this consultation; this includes time spent with patient/family, chart review and documentation. More than 50% of the time in this consultation was spent on counseling and coordinating communication.  Thank you for the opportunity to participate in the care of Deanna Strong Please call our office at [phone] if we can be of additional assistance.  Note: Portions of this note were generated with Lobbyist. Dictation errors may occur despite best attempts at proofreading.  Teodoro Spray, NP

## 2023-02-23 ENCOUNTER — Non-Acute Institutional Stay: Payer: Medicare Other | Admitting: Hospice

## 2023-02-23 DIAGNOSIS — F03918 Unspecified dementia, unspecified severity, with other behavioral disturbance: Secondary | ICD-10-CM

## 2023-02-23 DIAGNOSIS — Z515 Encounter for palliative care: Secondary | ICD-10-CM

## 2023-02-23 DIAGNOSIS — K5901 Slow transit constipation: Secondary | ICD-10-CM

## 2023-02-23 DIAGNOSIS — F03911 Unspecified dementia, unspecified severity, with agitation: Secondary | ICD-10-CM

## 2023-02-23 NOTE — Progress Notes (Signed)
[facility] Consult Note Telephone: [phone]  Fax: [phone]  PATIENT NAME: Deanna Strong DOB: Jan 27, 1934 MRN: [id number]  PRIMARY CARE PROVIDER:  Dr. Charlynne Pander  REFERRING PROVIDER: Dr. Charlynne Pander  RESPONSIBLE PARTY:   Contact Information     Name Relation Home Work Mobile   Neidy, [place] Daughter [phone]  [phone]   Lashanda, [place] Daughter [phone]         Visit is to build trust and highlight Palliative Medicine as specialized medical care for people living with serious illness, aimed at facilitating better quality of life through symptoms relief, assisting with advance care planning and complex medical decision making. This is a follow up visit.   RECOMMENDATIONS/PLAN:   Advance Care Planning/Code Status:Patient is a Do Not Resuscitate  Goals of Care: Goals of care include to maximize quality of life and symptom management.  Comfort care only  Symptom management/Plan:  Comfort measures.   Patient appears content, in no distress.   Will continue to follow, monitor comfort, Weight, appetite, progression of chronic disease, symptoms of pain, currently asymptomatic.  Nursing staff updated, no new changes; medical goals plan of care and medications reviewed.  Dementia, Advanced with behavioral disturbance: Continue ongoing supportive care. FAST 6D. Patient is comfort care only; often refuses care.  Continue fall/safety precautions  Agitation: Related to Dementia. Refuses to take meds per her faith. Haldol, Ativan were dcd. Use calm approach, distraction, persuasion.  Psych is following patient.   Constipation: Managed with milk of magnesia. Weight  104.7 Ibs April '24, 107.4 Feb '24 106.04 Oct 2022  Follow up: Palliative care will continue to follow for complex medical decision making, advance care planning, and clarification of goals. Return 6 weeks or prn. Encouraged to call provider sooner with any  concerns.  CHIEF COMPLAINT: Palliative follow up  HISTORY OF PRESENT ILLNESS:  Deanna Strong a 89 y.o. female with multiple medical problems including dementia with behavioral disturbance, psychosis, agitation, followed by Psych. History of hypertension, femoral fracture -left. Patient is calm during visit, answered few questions directed at her, refused physical exam.  She was earlier seen going from 1 residents room to another greeting them and being friendly. History obtained from review of EMR, discussion with nursing staff, family and/or patient. Records reviewed and summarized above. All 10 point systems reviewed and are negative except as documented in history of present illness above Review and summarization of Epic records shows history from other than patient.   Palliative Care was asked to follow this patient o help address complex decision making in the context of advance care planning and goals of care clarification.    PERTINENT MEDICATIONS:  Outpatient Encounter Medications as of 02/23/2023  Medication Sig   acetaminophen (TYLENOL) 325 MG tablet Take 1-2 tablets (325-650 mg total) by mouth every 6 (six) hours as needed for mild pain (pain score 1-3 or temp > 100.5).   alum & mag hydroxide-simeth (MINTOX) 200-200-20 MG/5ML suspension Take 30 mLs by mouth every 6 (six) hours as needed for indigestion or heartburn.   ascorbic acid (VITAMIN C) 500 MG tablet Take 1 tablet (500 mg total) by mouth daily.   Cholecalciferol (VITAMIN D) 125 MCG (5000 UT) CAPS Take 1 capsule by mouth daily.   ciclopirox (LOPROX) 0.77 % cream Apply 1 application topically 2 (two) times daily. Apply to feet for fungal infection   guaiFENesin (ROBITUSSIN) 100 MG/5ML SOLN Take 10 mLs by mouth every 6 (six) hours as needed for cough or  to loosen phlegm.    haloperidol lactate (HALDOL) 5 MG/ML injection Inject 0.4 mLs (2 mg total) into the vein every 6 (six) hours as needed.   loperamide (IMODIUM) 2 MG capsule Take 2  mg by mouth daily as needed for diarrhea or loose stools.    magnesium hydroxide (MILK OF MAGNESIA) 400 MG/5ML suspension Take 30 mLs by mouth daily as needed for mild constipation.   Multiple Vitamin (MULTIVITAMIN WITH MINERALS) TABS tablet Take 1 tablet by mouth daily.   neomycin-bacitracin-polymyxin (NEOSPORIN) OINT Apply 1 application topically daily as needed for wound care.    OVER THE COUNTER MEDICATION Take 1 tablet by mouth 2 (two) times daily. Digestive enzyme all natural   Vitamin D, Ergocalciferol, (DRISDOL) 1.25 MG (50000 UNIT) CAPS capsule Take 1 capsule (50,000 Units total) by mouth every 7 (seven) days.   No facility-administered encounter medications on file as of 02/23/2023.    HOSPICE ELIGIBILITY/DIAGNOSIS: TBD  PAST MEDICAL HISTORY:  Past Medical History:  Diagnosis Date   Anemia    Dementia ([facility])    Depression    Hyperlipidemia    Hyperthyroidism    IBS (irritable bowel syndrome)    Psychosis ([facility]) 06/01/2018   Strabismus      ALLERGIES: No Known Allergies    I spent  35 minutes providing this consultation; this includes time spent with patient/family, chart review and documentation. More than 50% of the time in this consultation was spent on counseling and coordinating communication.  Thank you for the opportunity to participate in the care of Deanna Strong Please call our office at [phone] if we can be of additional assistance.  Note: Portions of this note were generated with Scientist, clinical (histocompatibility and immunogenetics). Dictation errors may occur despite best attempts at proofreading.  Rosaura Carpenter, NP

## 2023-03-25 ENCOUNTER — Non-Acute Institutional Stay: Payer: Medicare Other | Admitting: Hospice

## 2023-03-25 DIAGNOSIS — Z515 Encounter for palliative care: Secondary | ICD-10-CM

## 2023-03-25 DIAGNOSIS — F03918 Unspecified dementia, unspecified severity, with other behavioral disturbance: Secondary | ICD-10-CM

## 2023-03-25 DIAGNOSIS — F03911 Unspecified dementia, unspecified severity, with agitation: Secondary | ICD-10-CM

## 2023-03-25 DIAGNOSIS — K5901 Slow transit constipation: Secondary | ICD-10-CM

## 2023-03-25 NOTE — Progress Notes (Signed)
[facility] Palliative [facility] Consult Note Telephone: [phone]  Fax: [phone]  PATIENT NAME: Deanna Strong DOB: Sep 18, 1934 MRN: [id number]  PRIMARY CARE PROVIDER:  Dr. Charlynne Pander  REFERRING PROVIDER: Dr. Charlynne Pander  RESPONSIBLE PARTY:   Contact Information     Name Relation Home Work Mobile   Kaleen, [place] Daughter [phone]  [phone]   Angelmarie, [place] Daughter [phone]         Visit is to build trust and highlight Palliative Medicine as specialized medical care for people living with serious illness, aimed at facilitating better quality of life through symptoms relief, assisting with advance care planning and complex medical decision making. This is a follow up visit.   RECOMMENDATIONS/PLAN:   Advance Care Planning/Code Status:Patient is a Do Not Resuscitate  Goals of Care: Goals of care include to maximize quality of life and symptom management.  Comfort care only  Symptom management/Plan:  Comfort measures.   Ongoing report of patient refusing medications and care.  Nursing encouraged to use gentle persuasion.  Will continue to follow, monitor comfort, weight, appetite, progression of chronic disease, symptoms of pain, currently asymptomatic; medical goals plan of care and medications reviewed.  FLACC 0, Height 5 feet 3 inches, current weight: 102 Ibs5/2/24 104.7 Ibs April '24 108 Ibs 11/06/22  Dementia, Advanced with behavioral disturbance: Continue ongoing supportive care. FAST 6D. Patient is comfort care only; often refuses care.  Continue fall/safety precautions  Agitation: Related to advanced dementia and history of schizophrenia.  Off on refuses medications saying it is against heart failure.  Psych following.  Continue Seroquel and lurasidone as Deanna Strong.  Use calm approach, distraction, persuasion.     Constipation: Managed with milk of magnesia.  Follow up: Palliative care will continue to follow for complex medical  decision making, advance care planning, and clarification of goals. Return 6 weeks or prn. Encouraged to call provider sooner with any concerns.  CHIEF COMPLAINT: Palliative follow up  HISTORY OF PRESENT ILLNESS:  Deanna Strong a 89 y.o. female with multiple medical problems including dementia with behavioral disturbance, psychosis, agitation, schizophrenia followed by Psych. History of hypertension, femoral fracture -left. Patient reports all is well denies pain/discomfort, did not want to be bothered and refused physical exam.  History obtained from review of EMR, discussion with nursing staff, family and/or patient. Records reviewed and summarized above. All 10 point systems reviewed and are negative except as documented in history of present illness above Review and summarization of Epic records shows history from other than patient.   Palliative Care was asked to follow this patient o help address complex decision making in the context of advance care planning and goals of care clarification.    PERTINENT MEDICATIONS:  Outpatient Encounter Medications as of 03/25/2023  Medication Sig   acetaminophen (TYLENOL) 325 MG tablet Take 1-2 tablets (325-650 mg total) by mouth every 6 (six) hours as needed for mild pain (pain score 1-3 or temp > 100.5).   alum & mag hydroxide-simeth (MINTOX) 200-200-20 MG/5ML suspension Take 30 mLs by mouth every 6 (six) hours as needed for indigestion or heartburn.   ascorbic acid (VITAMIN C) 500 MG tablet Take 1 tablet (500 mg total) by mouth daily.   Cholecalciferol (VITAMIN D) 125 MCG (5000 UT) CAPS Take 1 capsule by mouth daily.   ciclopirox (LOPROX) 0.77 % cream Apply 1 application topically 2 (two) times daily. Apply to feet for fungal infection   guaiFENesin (ROBITUSSIN) 100 MG/5ML SOLN Take 10 mLs by mouth  every 6 (six) hours as needed for cough or to loosen phlegm.    haloperidol lactate (HALDOL) 5 MG/ML injection Inject 0.4 mLs (2 mg total) into the vein every  6 (six) hours as needed.   loperamide (IMODIUM) 2 MG capsule Take 2 mg by mouth daily as needed for diarrhea or loose stools.    magnesium hydroxide (MILK OF MAGNESIA) 400 MG/5ML suspension Take 30 mLs by mouth daily as needed for mild constipation.   Multiple Vitamin (MULTIVITAMIN WITH MINERALS) TABS tablet Take 1 tablet by mouth daily.   neomycin-bacitracin-polymyxin (NEOSPORIN) OINT Apply 1 application topically daily as needed for wound care.    OVER THE COUNTER MEDICATION Take 1 tablet by mouth 2 (two) times daily. Digestive enzyme all natural   Vitamin D, Ergocalciferol, (DRISDOL) 1.25 MG (50000 UNIT) CAPS capsule Take 1 capsule (50,000 Units total) by mouth every 7 (seven) days.   No facility-administered encounter medications on file as of 03/25/2023.    HOSPICE ELIGIBILITY/DIAGNOSIS: TBD  PAST MEDICAL HISTORY:  Past Medical History:  Diagnosis Date   Anemia    Dementia ([facility])    Depression    Hyperlipidemia    Hyperthyroidism    IBS (irritable bowel syndrome)    Psychosis ([facility]) 06/01/2018   Strabismus      ALLERGIES: No Known Allergies    I spent  35 minutes providing this consultation; this includes time spent with patient/family, chart review and documentation. More than 50% of the time in this consultation was spent on counseling and coordinating communication.  Thank you for the opportunity to participate in the care of Deanna Strong Please call our office at [phone] if we can be of additional assistance.  Note: Portions of this note were generated with Scientist, clinical (histocompatibility and immunogenetics). Dictation errors may occur despite best attempts at proofreading.  Rosaura Carpenter, NP

## 2023-04-24 ENCOUNTER — Non-Acute Institutional Stay: Payer: Medicare Other | Admitting: Hospice

## 2023-04-24 DIAGNOSIS — F03911 Unspecified dementia, unspecified severity, with agitation: Secondary | ICD-10-CM

## 2023-04-24 DIAGNOSIS — F03918 Unspecified dementia, unspecified severity, with other behavioral disturbance: Secondary | ICD-10-CM

## 2023-04-24 DIAGNOSIS — Z515 Encounter for palliative care: Secondary | ICD-10-CM

## 2023-04-24 DIAGNOSIS — K5901 Slow transit constipation: Secondary | ICD-10-CM

## 2023-04-24 NOTE — Progress Notes (Signed)
Therapist, nutritional Palliative Care Consult Note Telephone: [phone]  Fax: [phone]  PATIENT NAME: Deanna Strong DOB: 1934/06/26 MRN: [id number]  PRIMARY CARE PROVIDER:  Dr. Charlynne Pander  REFERRING PROVIDER: Dr. Charlynne Pander  RESPONSIBLE PARTY:   Contact Information     Name Relation Home Work Mobile   Sung, [place] Daughter [phone]  [phone]   Clytie, [place] Daughter [phone]         Visit is to build trust and highlight Palliative Medicine as specialized medical care for people living with serious illness, aimed at facilitating better quality of life through symptoms relief, assisting with advance care planning and complex medical decision making. This is a follow up visit.   RECOMMENDATIONS/PLAN:   Advance Care Planning/Code Status:Patient is a Do Not Resuscitate  Goals of Care: Goals of care include to maximize quality of life and symptom management.  Comfort care only  Symptom management/Plan:  Comfort measures.   Ongoing report of patient refusing medications and care.  Nursing encouraged to use gentle persuasion.  Will continue to follow, monitor comfort, weight, appetite, progression of chronic disease, symptoms of pain, currently asymptomatic; medical goals plan of care and medications reviewed.  FLACC 0, Height 5 feet 3 inches, current weight: 100 Ibs 04/11/23 102 Ibs5/2/24 104.7 Ibs April '24 108 Ibs 11/06/22  Dementia, Advanced with behavioral disturbance: Continue ongoing supportive care. FAST 6D. Patient is comfort care only; often refuses care.  Continue fall/safety precautions  Agitation: Related to advanced dementia and history of schizophrenia. Psych following. Continue fluPHENAZine HCl Oral Concentrate 5 MG/ML (Fluphenazine HCl)  Give 1 ml by mouth one time a day for Psychosis Use calm approach, distraction, persuasion.     Constipation: Managed with milk of magnesia.  Follow up: Palliative care will continue to  follow for complex medical decision making, advance care planning, and clarification of goals. Return 6 weeks or prn. Encouraged to call provider sooner with any concerns.  CHIEF COMPLAINT: Palliative follow up  HISTORY OF PRESENT ILLNESS:  Deanna Strong a 89 y.o. female with multiple medical problems including dementia with behavioral disturbance, psychosis, agitation, schizophrenia followed by Psych. History of hypertension, femoral fracture -left. Patient refuses to engage or be touched, otherwise in no distress, FLACC 0.   History obtained from review of EMR, discussion with nursing staff, family and/or patient.  Review and summarization of Epic records shows history from other than patient.   Palliative Care was asked to follow this patient o help address complex decision making in the context of advance care planning and goals of care clarification.    PERTINENT MEDICATIONS:  Outpatient Encounter Medications as of 04/24/2023  Medication Sig   acetaminophen (TYLENOL) 325 MG tablet Take 1-2 tablets (325-650 mg total) by mouth every 6 (six) hours as needed for mild pain (pain score 1-3 or temp > 100.5).   alum & mag hydroxide-simeth (MINTOX) 200-200-20 MG/5ML suspension Take 30 mLs by mouth every 6 (six) hours as needed for indigestion or heartburn.   ascorbic acid (VITAMIN C) 500 MG tablet Take 1 tablet (500 mg total) by mouth daily.   Cholecalciferol (VITAMIN D) 125 MCG (5000 UT) CAPS Take 1 capsule by mouth daily.   ciclopirox (LOPROX) 0.77 % cream Apply 1 application topically 2 (two) times daily. Apply to feet for fungal infection   guaiFENesin (ROBITUSSIN) 100 MG/5ML SOLN Take 10 mLs by mouth every 6 (six) hours as needed for cough or to loosen phlegm.    haloperidol lactate (HALDOL) 5 MG/ML  injection Inject 0.4 mLs (2 mg total) into the vein every 6 (six) hours as needed.   loperamide (IMODIUM) 2 MG capsule Take 2 mg by mouth daily as needed for diarrhea or loose stools.    magnesium  hydroxide (MILK OF MAGNESIA) 400 MG/5ML suspension Take 30 mLs by mouth daily as needed for mild constipation.   Multiple Vitamin (MULTIVITAMIN WITH MINERALS) TABS tablet Take 1 tablet by mouth daily.   neomycin-bacitracin-polymyxin (NEOSPORIN) OINT Apply 1 application topically daily as needed for wound care.    OVER THE COUNTER MEDICATION Take 1 tablet by mouth 2 (two) times daily. Digestive enzyme all natural   Vitamin D, Ergocalciferol, (DRISDOL) 1.25 MG (50000 UNIT) CAPS capsule Take 1 capsule (50,000 Units total) by mouth every 7 (seven) days.   No facility-administered encounter medications on file as of 04/24/2023.    HOSPICE ELIGIBILITY/DIAGNOSIS: TBD  PAST MEDICAL HISTORY:  Past Medical History:  Diagnosis Date   Anemia    Dementia (HCC)    Depression    Hyperlipidemia    Hyperthyroidism    IBS (irritable bowel syndrome)    Psychosis (HCC) 06/01/2018   Strabismus      ALLERGIES: No Known Allergies    I spent  35 minutes providing this consultation; this includes time spent with patient/family, chart review and documentation. More than 50% of the time in this consultation was spent on counseling and coordinating communication.  Thank you for the opportunity to participate in the care of Deanna Strong Please call our office at [phone] if we can be of additional assistance.  Note: Portions of this note were generated with Scientist, clinical (histocompatibility and immunogenetics). Dictation errors may occur despite best attempts at proofreading.  Rosaura Carpenter, NP

## 2023-10-09 ENCOUNTER — Emergency Department (HOSPITAL_COMMUNITY)
Admission: EM | Admit: 2023-10-09 | Discharge: 2023-10-11 | Disposition: A | Discharge status: Other Acute Inpatient Hospital | Payer: Medicare Other | Attending: Emergency Medicine | Admitting: Emergency Medicine

## 2023-10-09 ENCOUNTER — Other Ambulatory Visit: Payer: Self-pay

## 2023-10-09 ENCOUNTER — Encounter (HOSPITAL_COMMUNITY): Payer: Self-pay

## 2023-10-09 DIAGNOSIS — Z91148 Patient's other noncompliance with medication regimen for other reason: Secondary | ICD-10-CM | POA: Diagnosis not present

## 2023-10-09 DIAGNOSIS — R4689 Other symptoms and signs involving appearance and behavior: Secondary | ICD-10-CM | POA: Insufficient documentation

## 2023-10-09 DIAGNOSIS — F0392 Unspecified dementia, unspecified severity, with psychotic disturbance: Secondary | ICD-10-CM | POA: Insufficient documentation

## 2023-10-09 DIAGNOSIS — N3000 Acute cystitis without hematuria: Secondary | ICD-10-CM | POA: Diagnosis not present

## 2023-10-09 DIAGNOSIS — F0390 Unspecified dementia without behavioral disturbance: Secondary | ICD-10-CM | POA: Insufficient documentation

## 2023-10-09 DIAGNOSIS — F209 Schizophrenia, unspecified: Secondary | ICD-10-CM | POA: Insufficient documentation

## 2023-10-09 DIAGNOSIS — I6782 Cerebral ischemia: Secondary | ICD-10-CM | POA: Diagnosis not present

## 2023-10-09 DIAGNOSIS — R456 Violent behavior: Secondary | ICD-10-CM | POA: Diagnosis not present

## 2023-10-09 DIAGNOSIS — F69 Unspecified disorder of adult personality and behavior: Secondary | ICD-10-CM | POA: Insufficient documentation

## 2023-10-09 LAB — COMPREHENSIVE METABOLIC PANEL
ALT: 16 U/L (ref 0–44)
AST: 21 U/L (ref 15–41)
Albumin: 4.2 g/dL (ref 3.5–5.0)
Alkaline Phosphatase: 72 U/L (ref 38–126)
Anion gap: 10 (ref 5–15)
BUN: 10 mg/dL (ref 8–23)
CO2: 21 mmol/L — ABNORMAL LOW (ref 22–32)
Calcium: 9 mg/dL (ref 8.9–10.3)
Chloride: 107 mmol/L (ref 98–111)
Creatinine, Ser: 0.54 mg/dL (ref 0.44–1.00)
GFR, Estimated: >60 mL/min (ref >60)
Glucose, Bld: 114 mg/dL — ABNORMAL HIGH (ref 70–99)
Potassium: 3.5 mmol/L (ref 3.5–5.1)
Sodium: 138 mmol/L (ref 135–145)
Total Bilirubin: 0.7 mg/dL (ref <1.2)
Total Protein: 6.9 g/dL (ref 6.5–8.1)

## 2023-10-09 LAB — URINALYSIS, ROUTINE W REFLEX MICROSCOPIC
Bilirubin Urine: NEGATIVE
Glucose, UA: NEGATIVE mg/dL
Hgb urine dipstick: NEGATIVE
Ketones, ur: NEGATIVE mg/dL
Nitrite: POSITIVE — AB
Protein, ur: 30 mg/dL — AB
Specific Gravity, Urine: 1.015 (ref 1.005–1.030)
WBC, UA: >50 WBC/hpf (ref 0–5)
pH: 5 (ref 5.0–8.0)

## 2023-10-09 LAB — CBC WITH DIFFERENTIAL/PLATELET
Abs Immature Granulocytes: 0.02 10*3/uL (ref 0.00–0.07)
Basophils Absolute: 0 10*3/uL (ref 0.0–0.1)
Basophils Relative: 0 %
Eosinophils Absolute: 0 10*3/uL (ref 0.0–0.5)
Eosinophils Relative: 0 %
HCT: 47.2 % — ABNORMAL HIGH (ref 36.0–46.0)
Hemoglobin: 15.3 g/dL — ABNORMAL HIGH (ref 12.0–15.0)
Immature Granulocytes: 0 %
Lymphocytes Relative: 13 %
Lymphs Abs: 1 10*3/uL (ref 0.7–4.0)
MCH: 29.4 pg (ref 26.0–34.0)
MCHC: 32.4 g/dL (ref 30.0–36.0)
MCV: 90.8 fL (ref 80.0–100.0)
Monocytes Absolute: 0.4 10*3/uL (ref 0.1–1.0)
Monocytes Relative: 6 %
Neutro Abs: 5.8 10*3/uL (ref 1.7–7.7)
Neutrophils Relative %: 81 %
Platelets: 232 10*3/uL (ref 150–400)
RBC: 5.2 MIL/uL — ABNORMAL HIGH (ref 3.87–5.11)
RDW: 13.2 % (ref 11.5–15.5)
WBC: 7.3 10*3/uL (ref 4.0–10.5)
nRBC: 0 % (ref 0.0–0.2)

## 2023-10-09 LAB — RAPID URINE DRUG SCREEN, HOSP PERFORMED
Amphetamines: NOT DETECTED
Barbiturates: NOT DETECTED
Benzodiazepines: NOT DETECTED
Cocaine: NOT DETECTED
Opiates: NOT DETECTED
Tetrahydrocannabinol: NOT DETECTED

## 2023-10-09 LAB — ETHANOL: Alcohol, Ethyl (B): <10 mg/dL (ref <10)

## 2023-10-09 MED ORDER — MAGNESIUM HYDROXIDE 400 MG/5ML PO SUSP: 30.0000 mL | Freq: Every day | ORAL | Status: DC | PRN

## 2023-10-09 MED ORDER — HALOPERIDOL LACTATE 5 MG/ML IJ SOLN
2.0000 mg | Freq: Four times a day (QID) | INTRAMUSCULAR | Status: DC | PRN
  Filled 2023-10-09: qty 1

## 2023-10-09 MED ORDER — CEPHALEXIN 250 MG PO CAPS
250.0000 mg | ORAL_CAPSULE | Freq: Three times a day (TID) | ORAL | Status: DC
  Administered 2023-10-11: 250 mg via ORAL
  Filled 2023-10-09 (×3): qty 1

## 2023-10-09 MED ORDER — ACETAMINOPHEN 325 MG PO TABS: 325.0000 mg | ORAL_TABLET | Freq: Four times a day (QID) | ORAL | Status: DC | PRN
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
## 2023-10-09 NOTE — ED Notes (Signed)
Pt refuses to get an EKG on her and pt also refuse get her vitals updates pt states that her life is very hard.
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
## 2023-10-09 NOTE — BH Assessment (Addendum)
[facility] to see.  -The consult for patient @1957  has been deferred to [facility]. [place], the [facility] Care Coordinator, will now oversee and coordinate the necessary services. For any inquiries or follow-up, please contact the [facility] Telecare Coordinator (TCC) at [phone].  -The [facility] provider will notify the care team via chat once the consult time is scheduled. The [facility] care team has been updated accordingly.

## 2023-10-10 DIAGNOSIS — F69 Unspecified disorder of adult personality and behavior: Secondary | ICD-10-CM | POA: Diagnosis not present

## 2023-10-10 NOTE — ED Notes (Signed)
Have tried to talk patient into taking the prescribed  antibiotic    she was almost talked into it and then refused   she said she hasn't taken any meds in years

## 2023-10-10 NOTE — ED Notes (Addendum)
Per NP she received collateral from SNF. Based on this, will start patient on medication and monitor overnight for her response, AM Psych reassessment

## 2023-10-10 NOTE — ED Notes (Signed)
Unable to start TTS consult for the patient d/t no rooms available for consult.

## 2023-10-10 NOTE — Consult Note (Cosign Needed Addendum)
Telepsych Consultation   Reason for Consult:  "Psych Consult" Referring Physician:  Linwood Dibbles, MD  Location of Patient:    [facility] Location of Provider: Other: virtual home office  Patient Identification: [facility] MRN:  [id number] Principal Diagnosis: Behavior concern in adult Diagnosis:  Principal Problem:   Behavior concern in adult   Total Time spent with patient: 1.5 hours  Subjective:   [facility] is a 89 y.o. female patient admitted with  Per RN Triage Note 10/09/2023@1532 : "Arrived via [facility], from [place] for violent behavior towards staff members and self harm with small scissors. Pt does have dementia schizophrenia. Not on any psychotropic meds per EMS. 5 haldol given with EMS.  128/64 80 HR 95 RA 156 cbg"  Per RN Triage Note dated 10/09/2023@1540 : Per Marylene Land at [facility], Pt is only alert to self noncompliant with psych meds haldol. More violent than usual today.   HPI:   Tele Assessment [name], 89 y.o., female patient.    Patient seen via telepsych by this provider; chart reviewed and consulted with Dr. Jannifer Franklin on 10/10/23.  On evaluation De[facility] is observed sitting in exam room and her daughter, Deanna Strong is at her side. Patient is wearing a baseball cap and street clothes; she is appropriately dressed, her appearance is not disheveled and she is not ill appearing.  Patient and her daughter greeted by Clinical research associate and given anticipatory guidance.  Patient agrees to assessment.    She smiles at this writer and states, "you have a nice smile and teeth." She acknowledges her edentulous concerns and smiles.  She is alert and oriented x4; and reports doing well since her daughter is at her side.  When asked what brought her to the hospital she provides detailed information about her residential setting at [facility]."  She reports the facility as cultural diverse which she felt was okay, initially.  After the election she reports  feeling like an outcast as her political views differs from some of the other residents.  Patient reports growing up during the height of communism in [place] and expresses her disdain of those circumstances.  Appears recent interactions with other residents may be triggering for her.  Of note, she does not report being physically or mentally abused.  She states she does not eat unhealthy foods; she reports she did not sleep good last night because she was in the hospital hallway.  At [facility] reports she's usually restless, keeps her eyes open and ruminates over "my memories."  She shares pieces of her life journey from [place] to the [place].  Patient reports her overall satisfaction with life and expresses appreciation of her individual journey.    When asked why staff would say she was trying to hurt herself or someone else with scissors, she states this is not true.  She reports she has "candida albicans" related to many years of swimming.   She states she's being treated by a local doctor with topical creams.  She states at the time staff came to her room, she was using the scissors to trim her toes.  She reports she trims the callus skin often and until recently, had not encountered concerns with staff.  She denies trying to hurt anyone with scissors.  She denies trying to hurt herself. Her daughter collaborates that she does not believe her mother would do anything to hurt herself.   When asked about hx of mental illness.  She states she is  almost 89 and accepts that she has lost some of her "brains."  States she has heard the word dementia and is not objecting to it, however she does believe she has it.  She states she does not take medications, because she does not believe she needs them. Of note, patient's presentation appears incongruent with information reported for nurse triage, outlined above.   Patient's daughter states she is hard of hearing and observed intermittently repeating  questions to her mother.  Of note, patient able to hear quite well, is a good historian appropriately engaged in visit today. She ruminates about her desire to return home to [place] to sit in front of the tree until, "it's my time to go home. "   Per collateral received from Deanna Strong, patient's daughter. She is unsure of events that led to current hospitalization but states her sister may know.  She does collaborates her mother's hx for foot fungus.  She also reports her mother has small rounded scissors "kids like scissors" that are not conducive for hurting anyone. Regarding patient mental health hx she states, patient reports  "electromagnetic field" that influences her to do things. 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 She states within the past 10 years, her mother has frequently referenced electrical lines, hydraulic pipes and sewer systems as a means for electrical things being sent to her.      Patient interjects that she can tell the old airport is being dismantled;  she then goes into details explaining how metals are being used for vibrations. She is sure of this because she can feel the "vibrations while laying in bed at night."  She also reports hearing sounds, vibrations and attempts to make the sound with her voice.  She states the vibrations are not bothersome for her, and she denies being tormented by her thoughts.  While sharing her experience, she remains calm and does not appear upset. She denies audible hallucinations or command hallucinations.  She reports she has seen so much reality and denies the need to see things that aren't real.    At the request of patient's daughter spoke via conference call to with Okey Regal and Bluford Main.  Per Ferd Hibbs at [facility], noticed patient was in her room poking at her feet with cuticle scissors.  She notes this is habitual and her mother has trimmed at her foot calluses for years.  She  notes on this day, staff saw blood and approached patient.  Historically, patient became very agitated, police were called and she remained agitated and physically combative, eventually was brought to the hospital.   She reports in 2019 patient was hospitalized a few times in 2019 for "unspecified psychosis;" she reports patient was d/c on risperidone "for a long time" it helped stabilize her mood.  States at some point patient refused medications because she thought they were "drugs."  They were able to give her LAIs for a while but then refused that as well.  She states he's mother is resistant to restarting medications.  When asked about hx for dementia, she states her mother was never dx with dementia and she does not believe she has memory problems.  She states she told her mother she had dementia to explain why she would need to take medications, "we thought it was easier to say dementia than psychosis."  She states patient is not followed by outpatient psychiatry and patient will not attend  outpatient psychiatry visits.  If medications were prescribed today, they would have to be continued by the primary doctor at [facility].  She also states her belief her mother would like to end her life with medical interventions but does not report safety concerns or that she would do something to hurt herself.    At present, patient stays at [facility] and then plan is for her to return once she has restated medication and is stabilized. Of note, Lamar Laundry states the reason her mother appears more clear today, is because she received haldol prior to leaving [facility], "that cleared her up."    Labs: CMP: WNL except for glucose which was non-fasting and elevated at 114mg /dl. CBC: WNL and does not offer medication contribution to her presentation.  UDS: + for leukocytes, nitrates, protein and bacteria consistent with UTI. She was started on keflex abx.   Head CT in 2021 IMPRESSION: 1. No acute  intracranial abnormality identified. 2. Mild chronic microvascular ischemic changes and parenchymal volume loss of the brain.  During evaluation Rena Barrilleaux is observed sitting on a chair in exam room; She is alert/oriented x 4; calm/cooperative; and mood congruent with affect.  Patient is speaking in a clear tone at moderate volume, and normal pace; with good eye contact.  Her thought process is coherent and relevant; There is no indication that she is currently responding to internal/external stimuli.  She does endorse fixed delusions and tactile hallucinations that are chronic but do not cause safety concerns.  Patient denies suicidal/self-harm/homicidal ideation, psychosis, and paranoia.  Patient has remained calm throughout assessment and has answered questions appropriately.    Per ED Provider Admission Assessment 10/09/2023 No chief complaint on file.     Dinisha Weyrauch is a 89 y.o. female.   HPI    Patient has a history of psychosis femur fracture dementia who presents to the ED for evaluation after violent behavior.  Patient states she does remember speaking rather loudly today.  She states she is vocal about what is going on in this country.  Patient denies that she tried to harm anyone.  She denies trying to harm herself.  EMS reports patient was violent towards staff members at [facility].  She apparently had small scissors with her.  Patient states she does recall several large bowel coming to check on her after she had her loud outburst.  Patient was given 5 mg of Haldol by EMS prior to arrival    Past Psychiatric History: unspecified psychosis and a few inpatient psych admission starting in 2019.   Risk to Self: at present patient denies;however, considering collateral patient may become impulsive when upset.  Risk to Others: at present patient denies;however, considering collateral patient may become impulsive when upset.  Prior Inpatient Therapy:  yes, in 2019 as outlined  above. Prior Outpatient Therapy:  denies  Past Medical History:  Past Medical History:  Diagnosis Date   Anemia    Dementia (HCC)    Depression    Hyperlipidemia    Hyperthyroidism    IBS (irritable bowel syndrome)    Psychosis (HCC) 06/01/2018   Strabismus     Past Surgical History:  Procedure Laterality Date   FEMUR IM NAIL Left 09/25/2020   Procedure: INTRAMEDULLARY (IM) RETROGRADE FEMORAL NAILING;  Surgeon: Myrene Galas, MD;  Location: [facility] OR;  Service: Orthopedics;  Laterality: Left;   Family History:  Family History  Family history unknown: Yes   Family Psychiatric  History: deferred Social History:  Social History   Substance and Sexual Activity  Alcohol Use Never     Social History   Substance and Sexual Activity  Drug Use Never    Social History   Socioeconomic History   Marital status: Divorced    Spouse name: Not on file   Number of children: Not on file   Years of education: Not on file   Highest education level: Not on file  Occupational History   Not on file  Tobacco Use   Smoking status: Former   Smokeless tobacco: Never  Substance and Sexual Activity   Alcohol use: Never   Drug use: Never   Sexual activity: Not on file  Other Topics Concern   Not on file  Social History Narrative   Not on file   Social Determinants of Health   Financial Resource Strain: Not on file  Food Insecurity: Not on file  Transportation Needs: Not on file  Physical Activity: Not on file  Stress: Not on file  Social Connections: Not on file   Additional Social History:    Allergies:  No Known Allergies  Labs:  Results for orders placed or performed during the hospital encounter of 10/09/23 (from the past 48 hour(s))  Urine rapid drug screen (hosp performed)     Status: None   Collection Time: 10/09/23  4:20 PM  Result Value Ref Range   Opiates NONE DETECTED NONE DETECTED   Cocaine NONE DETECTED NONE DETECTED   Benzodiazepines NONE DETECTED NONE  DETECTED   Amphetamines NONE DETECTED NONE DETECTED   Tetrahydrocannabinol NONE DETECTED NONE DETECTED   Barbiturates NONE DETECTED NONE DETECTED    Comment: (NOTE) DRUG SCREEN FOR MEDICAL PURPOSES ONLY.  IF CONFIRMATION IS NEEDED FOR ANY PURPOSE, NOTIFY LAB WITHIN 5 DAYS.  LOWEST DETECTABLE LIMITS FOR URINE DRUG SCREEN Drug Class                     Cutoff (ng/mL) Amphetamine and metabolites    1000 Barbiturate and metabolites    200 Benzodiazepine                 200 Opiates and metabolites        300 Cocaine and metabolites        300 THC                            50 Performed at [facility], [address], [place], [place] [address]   Urinalysis, Routine w reflex microscopic -Urine, Clean Catch     Status: Abnormal   Collection Time: 10/09/23  4:20 PM  Result Value Ref Range   Color, Urine YELLOW YELLOW   APPearance CLOUDY (A) CLEAR   Specific Gravity, Urine 1.015 1.005 - 1.030   pH 5.0 5.0 - 8.0   Glucose, UA NEGATIVE NEGATIVE mg/dL   Hgb urine dipstick NEGATIVE NEGATIVE   Bilirubin Urine NEGATIVE NEGATIVE   Ketones, ur NEGATIVE NEGATIVE mg/dL   Protein, ur 30 (A) NEGATIVE mg/dL   Nitrite POSITIVE (A) NEGATIVE   Leukocytes,Ua LARGE (A) NEGATIVE   RBC / HPF 11-20 0 - 5 RBC/hpf   WBC, UA >50 0 - 5 WBC/hpf   Bacteria, UA MANY (A) NONE SEEN   Squamous Epithelial / HPF 0-5 0 - 5 /HPF   WBC Clumps PRESENT    Mucus PRESENT     Comment: Performed at [facility], [address] [place],  [place] [address]  Comprehensive metabolic panel     Status: Abnormal   Collection Time: 10/09/23  5:59 PM  Result Value Ref Range   Sodium 138 135 - 145 mmol/L   Potassium 3.5 3.5 - 5.1 mmol/L   Chloride 107 98 - 111 mmol/L   CO2 21 (L) 22 - 32 mmol/L   Glucose, Bld 114 (H) 70 - 99 mg/dL    Comment: Glucose reference range applies only to samples taken after fasting for at least 8 hours.   BUN 10 8 - 23 mg/dL   Creatinine, Ser 1.02 0.44 -  1.00 mg/dL   Calcium 9.0 8.9 - 72.5 mg/dL   Total Protein 6.9 6.5 - 8.1 g/dL   Albumin 4.2 3.5 - 5.0 g/dL   AST 21 15 - 41 U/L   ALT 16 0 - 44 U/L   Alkaline Phosphatase 72 38 - 126 U/L   Total Bilirubin 0.7 <1.2 mg/dL   GFR, Estimated >36 >64 mL/min    Comment: (NOTE) Calculated using the CKD-EPI Creatinine Equation (2021)    Anion gap 10 5 - 15    Comment: Performed at [facility], [address] [place], [place] [address]  Ethanol     Status: None   Collection Time: 10/09/23  5:59 PM  Result Value Ref Range   Alcohol, Ethyl (B) <10 <10 mg/dL    Comment: (NOTE) Lowest detectable limit for serum alcohol is 10 mg/dL.  For medical purposes only. Performed at [facility], [address] [place], [place] [address]   CBC with Diff     Status: Abnormal   Collection Time: 10/09/23  5:59 PM  Result Value Ref Range   WBC 7.3 4.0 - 10.5 K/uL   RBC 5.20 (H) 3.87 - 5.11 MIL/uL   Hemoglobin 15.3 (H) 12.0 - 15.0 g/dL   HCT 63.8 (H) 75.6 - 43.3 %   MCV 90.8 80.0 - 100.0 fL   MCH 29.4 26.0 - 34.0 pg   MCHC 32.4 30.0 - 36.0 g/dL   RDW 29.5 18.8 - 41.6 %   Platelets 232 150 - 400 K/uL   nRBC 0.0 0.0 - 0.2 %   Neutrophils Relative % 81 %   Neutro Abs 5.8 1.7 - 7.7 K/uL   Lymphocytes Relative 13 %   Lymphs Abs 1.0 0.7 - 4.0 K/uL   Monocytes Relative 6 %   Monocytes Absolute 0.4 0.1 - 1.0 K/uL   Eosinophils Relative 0 %   Eosinophils Absolute 0.0 0.0 - 0.5 K/uL   Basophils Relative 0 %   Basophils Absolute 0.0 0.0 - 0.1 K/uL   Immature Granulocytes 0 %   Abs Immature Granulocytes 0.02 0.00 - 0.07 K/uL    Comment: Performed at [facility], [address] [place], [place] [address]    Medications:  Current Facility-Administered Medications  Medication Dose Route Frequency Provider Last Rate Last Admin   acetaminophen (TYLENOL) tablet 325-650 mg  325-650 mg Oral Q6H PRN Linwood Dibbles, MD       cephALEXin [facility]) capsule  250 mg  250 mg Oral Christiana Fuchs, MD       haloperidol lactate (HALDOL) injection 2 mg  2 mg Intramuscular Q6H PRN Linwood Dibbles, MD       magnesium hydroxide (MILK OF MAGNESIA) suspension 30 mL  30 mL Oral Daily PRN Linwood Dibbles, MD       Current Outpatient Medications  Medication Sig Dispense Refill   acetaminophen (TYLENOL)  325 MG tablet Take 1-2 tablets (325-650 mg total) by mouth every 6 (six) hours as needed for mild pain (pain score 1-3 or temp > 100.5). (Patient taking differently: Take 650 mg by mouth every 4 (four) hours as needed for mild pain (pain score 1-3) (CANNOT exceed a sum total of 3,000 mg/day from all combined sources).) 60 tablet 0   cholecalciferol (VITAMIN D3) 25 MCG (1000 UNIT) tablet Take 1,000 Units by mouth daily.     guaiFENesin (ROBITUSSIN) 100 MG/5ML SOLN Take 10 mLs by mouth every 4 (four) hours as needed for cough.     loperamide (IMODIUM) 2 MG capsule Take 2 mg by mouth every 6 (six) hours as needed for diarrhea or loose stools.     magnesium hydroxide (MILK OF MAGNESIA) 400 MG/5ML suspension Take 30 mLs by mouth daily as needed for mild constipation.     MINTOX 200-200-20 MG/5ML suspension Take 30 mLs by mouth every 6 (six) hours as needed for indigestion.     NUTRITIONAL SUPPLEMENTS PO Take by mouth See admin instructions. AHR frozen nutritional treat- Eat 1 cup by mouth three times a day with meals     ascorbic acid (VITAMIN C) 500 MG tablet Take 1 tablet (500 mg total) by mouth daily. (Patient not taking: Reported on 10/10/2023)     Cholecalciferol (VITAMIN D) 125 MCG (5000 UT) CAPS Take 1 capsule by mouth daily. (Patient not taking: Reported on 10/10/2023) 30 capsule 3   haloperidol lactate (HALDOL) 5 MG/ML injection Inject 0.4 mLs (2 mg total) into the vein every 6 (six) hours as needed. (Patient not taking: Reported on 10/10/2023) 1 mL 0   Multiple Vitamin (MULTIVITAMIN WITH MINERALS) TABS tablet Take 1 tablet by mouth daily. (Patient not taking: Reported on  10/10/2023)     Vitamin D, Ergocalciferol, (DRISDOL) 1.25 MG (50000 UNIT) CAPS capsule Take 1 capsule (50,000 Units total) by mouth every 7 (seven) days. (Patient not taking: Reported on 10/10/2023) 5 capsule     Musculoskeletal: patient moves all extremities and ambulates independently.  Strength & Muscle Tone: within normal limits Gait & Station: normal Patient leans: N/A   Psychiatric Specialty Exam:  Presentation  General Appearance: Appropriate for Environment; Fairly Groomed  Eye Contact:Good  Speech:Clear and Coherent; Normal Rate  Speech Volume:Normal  Handedness:Right   Mood and Affect  Mood:Euthymic  Affect:Congruent   Thought Process  Thought Processes:Coherent; Goal Directed  Descriptions of Associations:Intact  Orientation:Full (Time, Place and Person)  Thought Content:Logical  History of Schizophrenia/Schizoaffective disorder:No data recorded Duration of Psychotic Symptoms:No data recorded Hallucinations:Hallucinations: Tactile  Ideas of Reference:Delusions  Suicidal Thoughts:Suicidal Thoughts: No  Homicidal Thoughts:Homicidal Thoughts: No   Sensorium  Memory:Immediate Good; Recent Good; Remote Good  Judgment:Fair (in the setting of hx for psychosis and medication non-compliance)  Insight:Lacking (patient denies mental illness and as no insight)   Executive Functions  Concentration:Good  Attention Span:Good  Recall:Good  Fund of Knowledge:Good  Language:No data recorded  Psychomotor Activity  Psychomotor Activity:Psychomotor Activity: Normal   Assets  Assets:Communication Skills; Desire for Improvement; Housing; Social Support   Sleep  Sleep:Sleep: Fair Number of Hours of Sleep: 4    Physical Exam: Physical Exam Constitutional:      Appearance: Normal appearance.  Cardiovascular:     Rate and Rhythm: Normal rate.     Pulses: Normal pulses.  Pulmonary:     Effort: Pulmonary effort is normal.  Musculoskeletal:         General: Normal range of motion.  Neurological:  Mental Status: She is alert and oriented to person, place, and time. Mental status is at baseline.  Psychiatric:        Mood and Affect: Mood normal.        Behavior: Behavior normal.    Review of Systems  Constitutional: Negative.   HENT: Negative.    Eyes: Negative.   Respiratory: Negative.    Cardiovascular: Negative.   Gastrointestinal: Negative.   Genitourinary: Negative.   Musculoskeletal: Negative.   Skin: Negative.   Neurological: Negative.   Endo/Heme/Allergies: Negative.   Psychiatric/Behavioral:  Positive for hallucinations. Negative for depression, memory loss and suicidal ideas. The patient has insomnia.    Blood pressure (!) 120/90, pulse 81, temperature 98.4 F (36.9 C), temperature source Oral, resp. rate 18, SpO2 94%. There is no height or weight on file to calculate BMI.  Treatment Plan Summary: Patient with hx of unspecified psychosis, presents escorted by [facility] for violent behaviors towards [facility] staff with small scissors.  On assessment today, patient is alert and oriented x4; she is clear and coherent, very articulate and provides good historical information.  She denies suicidal or homicidal ideations and does not appear to be responding to internal stimulus.  Of note, she does have some fixed delusions and tactile hallucinations of vibrations flowing through her body. She does not feel sensations are personally tormenting and they do not cause functional limitations.  In speaking with her daughter, patient's tactile hallucinations are  chronic and have been present for >10 years.  Patient does not use alcohol, marijuana or illicit substances.     Both of patient's daughters denies hx for dementia.  However, per chart review, July 2021, patient's daughter reported she was diagnosed with dementia with psychotic features. She was diagnosed by Dr. Althea Grimmer and Dr. Stephania Fragmin. She has had two previous admissions  07/2018 [facility] and [facility] 05/2018.    Per Bluford Main, patient's daughter, she told patient she was dx with dementia because she was uncomfortable telling her she had psychosis.  Apparently, patient as hx for psychiatric hospitalizations, used to take psychotropic po and then LAI medication but began refusing them in 2019. Notwithstanding above, Patient's daughter Okey Regal) reports patient is at her baseline functioning and she does not believe her mother would hurt herself or anyone else.  Of note, patient has not had behavioral concerns in the past 5 years despite medication non-adherence.    Patient recently dx with acute cystitis without hematuria and started on keflex abx.  Suspect her infection may have contributed to irritability/behaviors prior to admission. Considering she has not had behavioral concerns in the past 24 hours since admission, and does not appear acutely mentally decompensated. Patient does not meet criteria for psychiatric admission.  Above was discussed with Deanna [facility] who agrees with plan of care.  Several unsuccessful attempts to contact [facility] to discuss plans for transport back to facility.  Believe this will not happen tonight but will  enter TOC for further assistance in AM.   Disposition: No evidence of imminent risk to self or others at present.   Patient does not meet criteria for psychiatric inpatient admission. Supportive therapy provided about ongoing stressors. Discussed crisis plan, support from social network, calling 911, coming to the Emergency Department, and calling Suicide Hotline.  This service was provided via telemedicine using a 2-way, interactive audio and video technology.  Names of all persons participating in this telemedicine service and their role in this encounter. Name: Quanisha Piasecki Role:  Patient   Name: Shamiah Rinn Role: Patient's daughter  Name: Bluford Main Role: Patient's daughter  Name: Ophelia Shoulder Role:  PMHNP  Name: Kennyth Arnold Role: Psychiatrist    Chales Abrahams, NP 10/10/2023 7:49 PM

## 2023-10-10 NOTE — BH Assessment (Signed)
At 0058, Per [employer] M. Rimando, RN: "I dont think we can do TTS right now. We dont have available beds for the patient for assessment."   IRIS coordinator asked RN reach out when ready and pt can be reschedule.   Redmond Pulling, MS, [facility], [facility] Triage Specialist [phone]

## 2023-10-10 NOTE — ED Provider Notes (Signed)
Emergency Medicine Observation Re-evaluation Note  Deanna Strong is a 89 y.o. female, seen on rounds today.  Pt initially presented to the ED for complaints of Aggressive Behavior Currently, the patient is asleep. She has not received psychiatry assessment yet.  She came from a nursing home because of aggressive behavior.  Physical Exam  BP 108/67   Pulse 67   Temp 98 F (36.7 C)   Resp 20   SpO2 99%  Physical Exam General: No distress Cardiac: Regular rate Lungs: No respiratory distress Psych: Currently calm  ED Course / MDM  EKG:   I have reviewed the labs performed to date as well as medications administered while in observation.  Recent changes in the last 24 hours include -no new changes.  Plan  Current plan is for patient to get psychiatric evaluation.    Derwood Kaplan, MD 10/10/23 [phone]

## 2023-10-11 DIAGNOSIS — N3000 Acute cystitis without hematuria: Secondary | ICD-10-CM | POA: Diagnosis not present

## 2023-10-11 MED ORDER — FOSFOMYCIN TROMETHAMINE 3 G PO PACK
3.0000 g | PACK | Freq: Once | ORAL | Status: AC
  Administered 2023-10-11: 3 g via ORAL
  Filled 2023-10-11: qty 3

## 2023-10-11 NOTE — Consult Note (Signed)
Several attempts to reach Hillery Jacks to discuss plan for patient discharge.  Unable to reach anyone, phone goes to voicemail.

## 2023-10-11 NOTE — ED Notes (Signed)
[facility] called    no idea what time she will be picked up

## 2023-10-11 NOTE — ED Notes (Signed)
Charge nurse said to move patient to TCU due a fall hazard   she is sleeping at this time, she didn't sleep much last night, we will move her once she wakes

## 2023-10-11 NOTE — Discharge Instructions (Signed)
You have been treated with a one-time dose of antibiotic for urinary tract infection. I expect that your symptoms will improved. Return with any new or suddenly worsening symptoms. Please follow close with your PCP in the coming week.

## 2023-10-11 NOTE — ED Notes (Signed)
Antibiotic was added to patients juice for breakfast    she has drank some of it

## 2023-10-11 NOTE — ED Provider Notes (Signed)
Emergency Medicine Observation Re-evaluation Note  Deanna Strong is a 89 y.o. female, seen on rounds today.  Pt initially presented to the ED for complaints of Aggressive Behavior Currently, the patient is boarding.  Physical Exam  BP 132/86   Pulse 89   Temp 98.3 F (36.8 C) (Oral)   Resp 20   SpO2 96%  Physical Exam General: Calm Cardiac: Well perfused Lungs: Even respirations  Psych: Calm  ED Course / MDM  EKG:   I have reviewed the labs performed to date as well as medications administered while in observation.  Recent changes in the last 24 hours include UTI treatment. Patient apparently refused Keflex. Will give single dose fosfomycin and reassess. TTS evaluated and patient does not meet inpatient criteria.   Plan  Current plan is for [facility] d/c.   10:34 AM  Patient is eating and cooperative. Gave single dose fosfomycin in the ED with concern that patient may intermittently refuse abx moving forward. TTS has cleared. Stable for d/c back to facility.     Maia Plan, MD 10/11/23 [phone]
